# Patient Record
Sex: Female | Born: 1945 | Race: Black or African American | Hispanic: No | Marital: Single | Smoking: Never smoker
Health system: Southern US, Community
[De-identification: ages and names within clinical notes are randomized; demographics above are authoritative.]

## PROBLEM LIST (undated history)

## (undated) DIAGNOSIS — I5189 Other ill-defined heart diseases: Secondary | ICD-10-CM

## (undated) DIAGNOSIS — I10 Essential (primary) hypertension: Secondary | ICD-10-CM

## (undated) DIAGNOSIS — I5022 Chronic systolic (congestive) heart failure: Secondary | ICD-10-CM

## (undated) DIAGNOSIS — I428 Other cardiomyopathies: Secondary | ICD-10-CM

## (undated) DIAGNOSIS — J309 Allergic rhinitis, unspecified: Secondary | ICD-10-CM

## (undated) DIAGNOSIS — Z87898 Personal history of other specified conditions: Secondary | ICD-10-CM

## (undated) DIAGNOSIS — R9430 Abnormal result of cardiovascular function study, unspecified: Secondary | ICD-10-CM

## (undated) DIAGNOSIS — I429 Cardiomyopathy, unspecified: Secondary | ICD-10-CM

## (undated) DIAGNOSIS — Z972 Presence of dental prosthetic device (complete) (partial): Secondary | ICD-10-CM

## (undated) DIAGNOSIS — E119 Type 2 diabetes mellitus without complications: Secondary | ICD-10-CM

## (undated) DIAGNOSIS — I509 Heart failure, unspecified: Secondary | ICD-10-CM

## (undated) DIAGNOSIS — I340 Nonrheumatic mitral (valve) insufficiency: Secondary | ICD-10-CM

## (undated) DIAGNOSIS — I351 Nonrheumatic aortic (valve) insufficiency: Secondary | ICD-10-CM

## (undated) DIAGNOSIS — E7800 Pure hypercholesterolemia, unspecified: Secondary | ICD-10-CM

## (undated) DIAGNOSIS — R931 Abnormal findings on diagnostic imaging of heart and coronary circulation: Secondary | ICD-10-CM

## (undated) DIAGNOSIS — E1151 Type 2 diabetes mellitus with diabetic peripheral angiopathy without gangrene: Secondary | ICD-10-CM

## (undated) DIAGNOSIS — I447 Left bundle-branch block, unspecified: Secondary | ICD-10-CM

## (undated) DIAGNOSIS — Z974 Presence of external hearing-aid: Secondary | ICD-10-CM

## (undated) DIAGNOSIS — J45909 Unspecified asthma, uncomplicated: Secondary | ICD-10-CM

## (undated) DIAGNOSIS — I420 Dilated cardiomyopathy: Secondary | ICD-10-CM

## (undated) DIAGNOSIS — R42 Dizziness and giddiness: Secondary | ICD-10-CM

## (undated) HISTORY — PX: LUMBAR LAMINECTOMY: SHX95

## (undated) HISTORY — PX: TONSILLECTOMY: SUR1361

## (undated) HISTORY — PX: APPENDECTOMY: SHX54

## (undated) HISTORY — DX: Heart failure, unspecified: I50.9

## (undated) HISTORY — PX: CHOLECYSTECTOMY: SHX55

---

## 2004-01-27 ENCOUNTER — Ambulatory Visit: Payer: Self-pay | Admitting: Internal Medicine

## 2004-08-11 ENCOUNTER — Other Ambulatory Visit: Payer: Self-pay

## 2004-08-11 ENCOUNTER — Emergency Department: Payer: Self-pay | Admitting: Emergency Medicine

## 2005-05-31 ENCOUNTER — Ambulatory Visit: Payer: Self-pay | Admitting: Internal Medicine

## 2005-06-23 ENCOUNTER — Ambulatory Visit: Payer: Self-pay | Admitting: Surgery

## 2005-09-25 ENCOUNTER — Emergency Department: Payer: Self-pay | Admitting: Unknown Physician Specialty

## 2006-10-24 ENCOUNTER — Ambulatory Visit: Payer: Self-pay | Admitting: Internal Medicine

## 2007-11-30 ENCOUNTER — Ambulatory Visit: Payer: Self-pay | Admitting: Internal Medicine

## 2008-12-24 ENCOUNTER — Ambulatory Visit: Payer: Self-pay | Admitting: Internal Medicine

## 2009-01-31 ENCOUNTER — Observation Stay: Payer: Self-pay | Admitting: Internal Medicine

## 2009-02-03 ENCOUNTER — Ambulatory Visit: Payer: Self-pay | Admitting: Internal Medicine

## 2009-03-04 ENCOUNTER — Other Ambulatory Visit: Payer: Self-pay | Admitting: Unknown Physician Specialty

## 2009-03-11 ENCOUNTER — Ambulatory Visit: Payer: Self-pay | Admitting: Unknown Physician Specialty

## 2010-01-21 ENCOUNTER — Ambulatory Visit: Payer: Self-pay | Admitting: Internal Medicine

## 2010-04-28 IMAGING — CT CT ABD-PELV W/ CM
1 of 2 series · 15 of 32 positions shown, 19 images · non-contrast
Comparison: none

REASON FOR EXAM: (1) LLQ pain; (2) LLQ pain
COMMENTS:   May transport without cardiac monitor

[Series 2: 3mm soft tissue · axial · 0.64mm/px · z∈[-407,+10]mm · 15 of 153 slices shown, 19 images]
[im 7/153  soft-tissue]
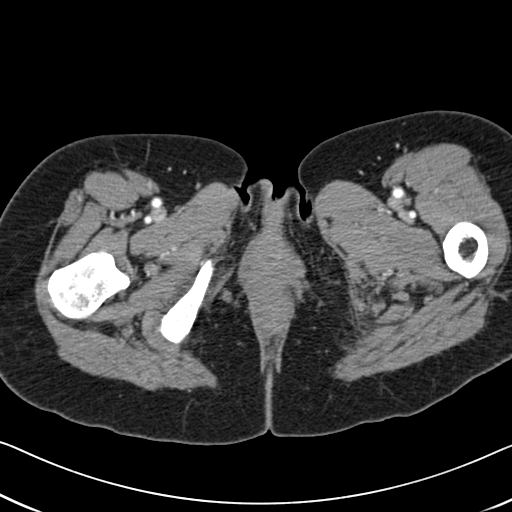
[im 7/153  bone]
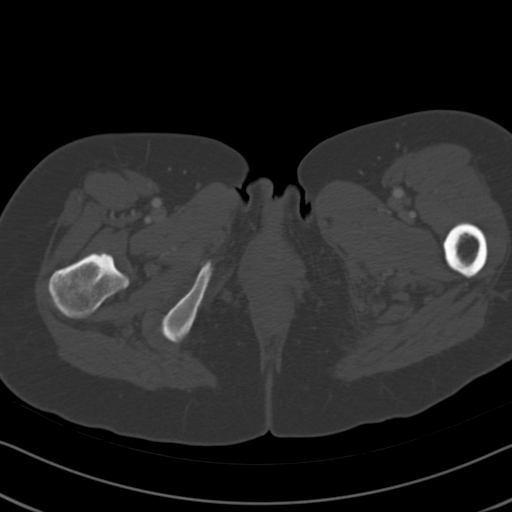
[im 20/153  soft-tissue]
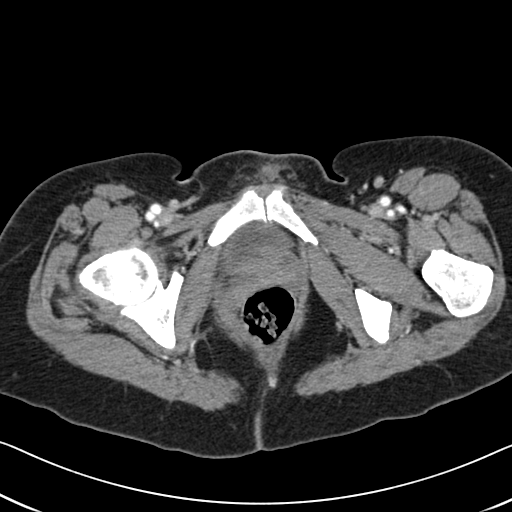
[im 32/153  soft-tissue]
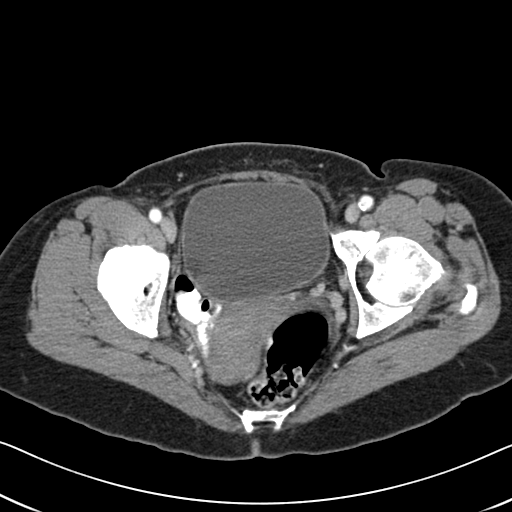
[im 45/153  soft-tissue]
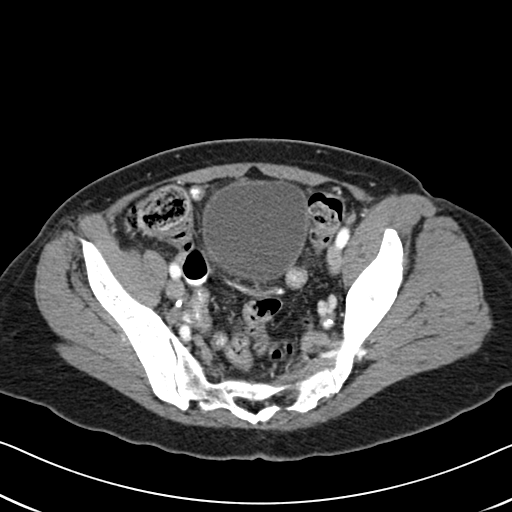
[im 51/153  soft-tissue]
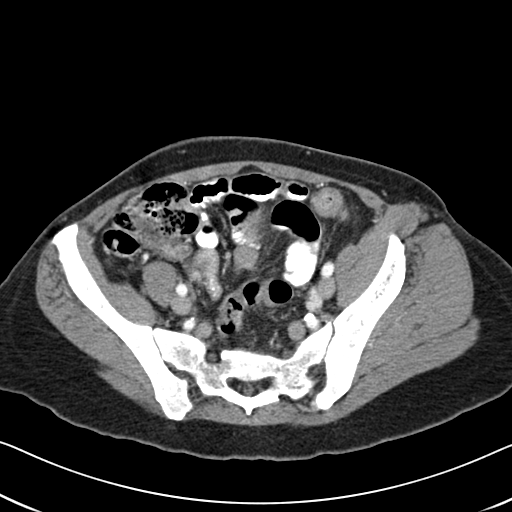
[im 64/153  soft-tissue]
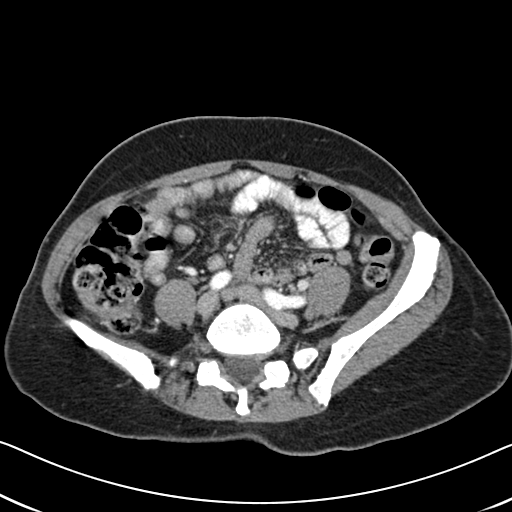
[im 77/153  soft-tissue]
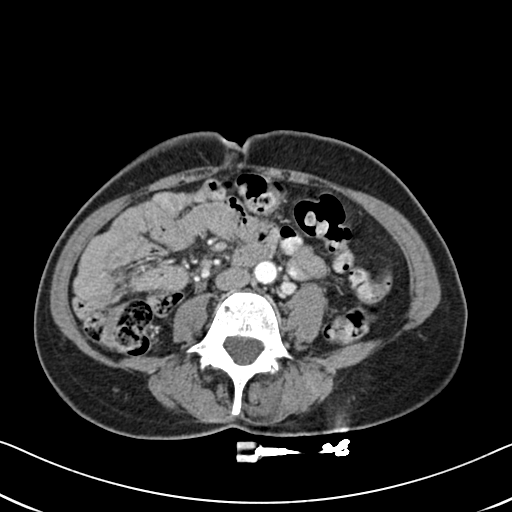
[im 89/153  soft-tissue]
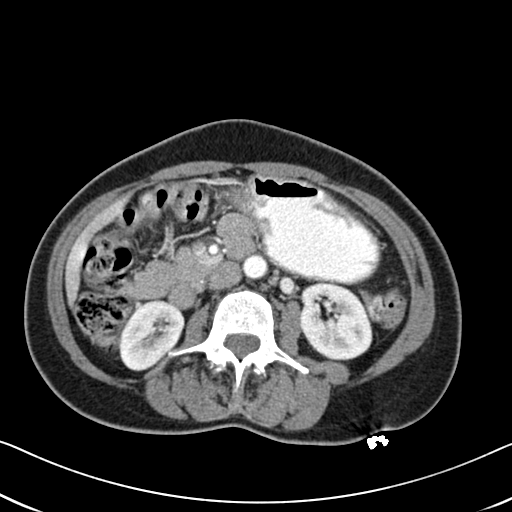
[im 102/153  soft-tissue]
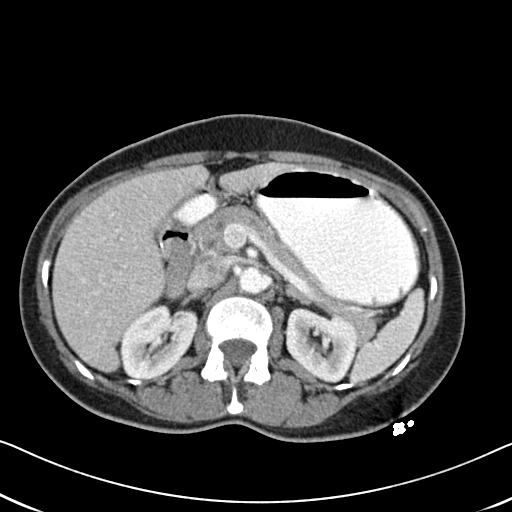
[im 102/153  bone]
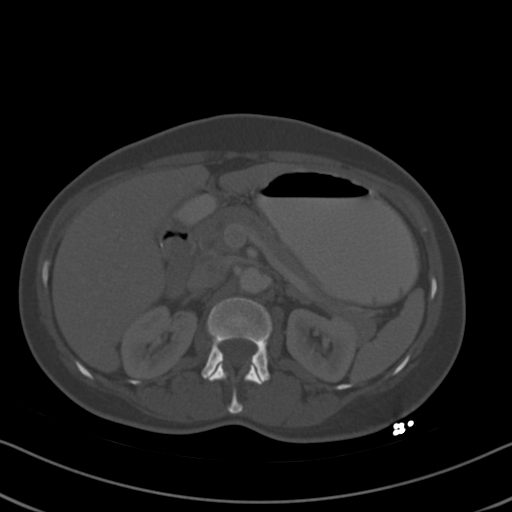
[im 108/153  soft-tissue]
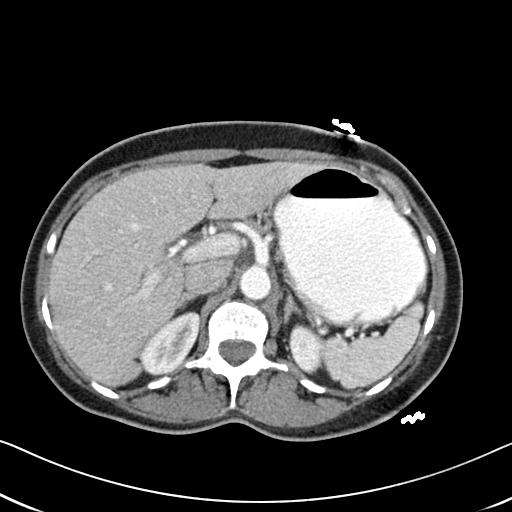
[im 121/153  soft-tissue]
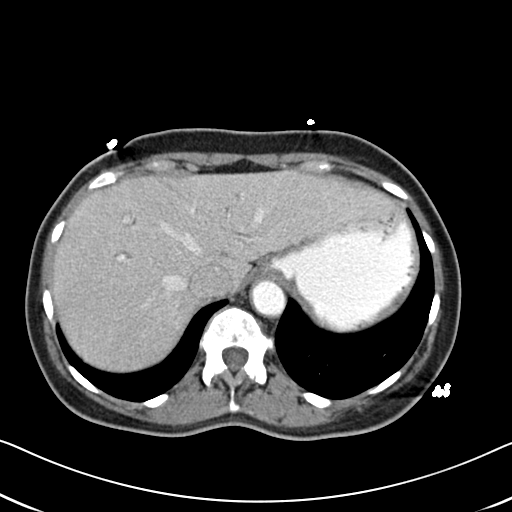
[im 127/153  lung]
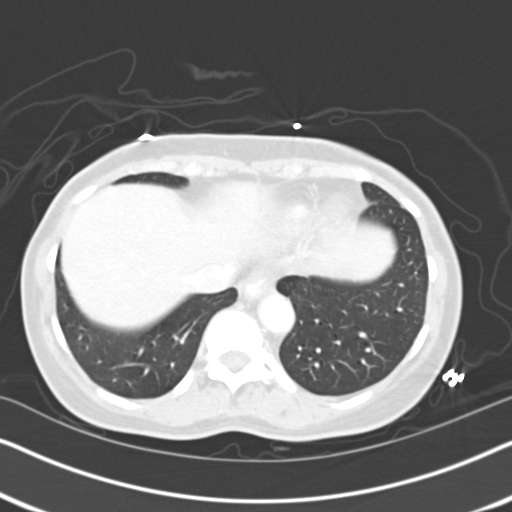
[im 134/153  soft-tissue]
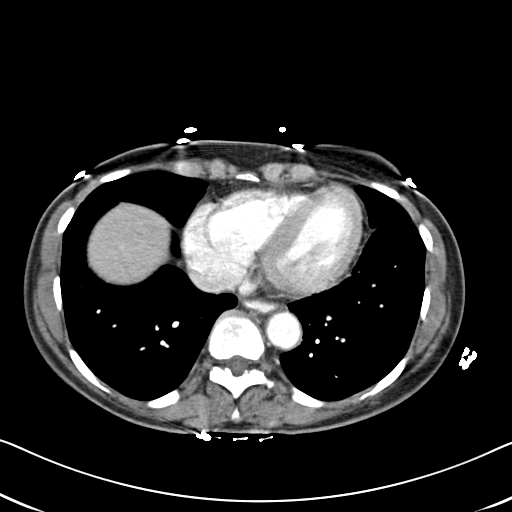
[im 134/153  lung]
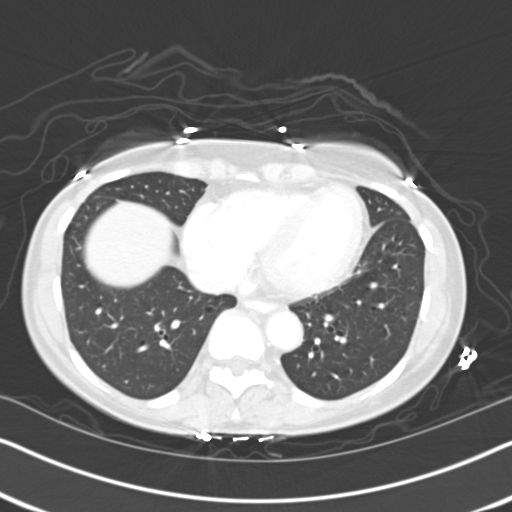
[im 140/153  lung]
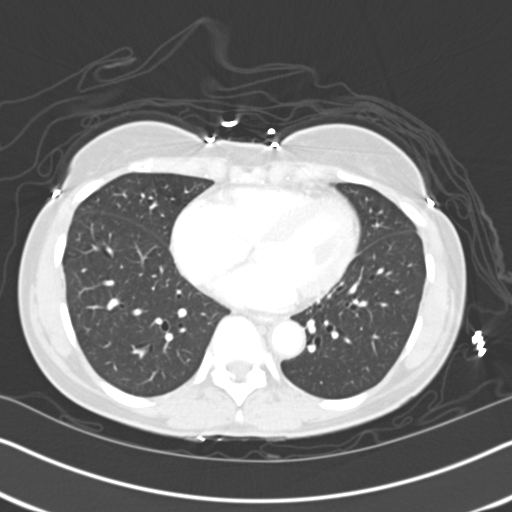
[im 146/153  soft-tissue]
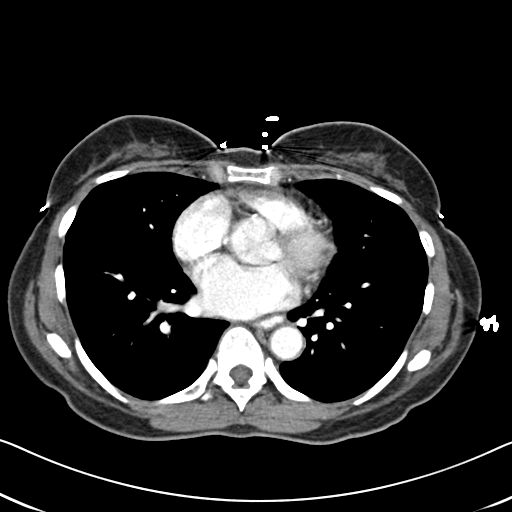
[im 146/153  lung]
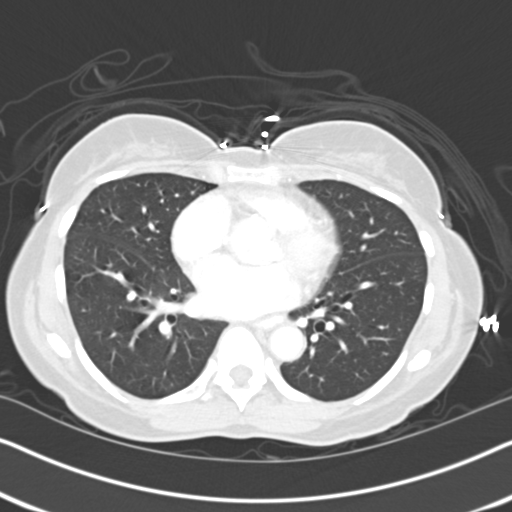

[15 of 32 positions shown; findings below may reference images not displayed]

PROCEDURE:     CT  - CT ABDOMEN / PELVIS  W  - January 31, 2009  [DATE]

RESULT:     CT of the abdomen and pelvis is performed in the standard
fashion. Oral and intravenous contrast was used with the patient receiving
85 mL of 3sovue-AVJ iodinated intravenous contrast. Images are reconstructed
at 3 mm slice thickness in the axial plane.

Cholecystectomy clips are present. There is mild prominence of the
intrahepatic biliary ductal system. The common bile duct just before
entering the pancreatic head measures up to 12 mm in diameter. Correlate
with clinical and laboratory data. The adrenal glands are normal. The spleen
is normal in size. The pancreas shows no discrete mass. The kidneys
demonstrate normal enhancement without obstruction. The bowel is
predominantly unopacified. There is a moderately large amount of urine in
the urinary bladder. No significant ascites or abnormal fluid collection is
seen. The appendix is not identified. There is no evidence of adenopathy.
There is no abnormal bowel distention, free air or bowel wall thickening.
IMPRESSION: 1. Status post cholecystectomy. Mild intrahepatic and extrahepatic biliary
ductal prominence which can be seen following cholecystectomy. No other
significant findings is demonstrated. The patient refused to drink the oral
contrast and therefore there is incomplete and poor opacification of the
bowel. If there are continued symptoms of an acute infectious process repeat
study could be considered.

## 2011-01-24 ENCOUNTER — Ambulatory Visit: Payer: Self-pay | Admitting: Internal Medicine

## 2012-01-25 ENCOUNTER — Ambulatory Visit: Payer: Self-pay | Admitting: Internal Medicine

## 2013-01-25 ENCOUNTER — Ambulatory Visit: Payer: Self-pay | Admitting: Internal Medicine

## 2014-01-27 ENCOUNTER — Ambulatory Visit: Payer: Self-pay | Admitting: Internal Medicine

## 2014-12-16 ENCOUNTER — Other Ambulatory Visit: Payer: Self-pay | Admitting: Internal Medicine

## 2014-12-16 DIAGNOSIS — Z1231 Encounter for screening mammogram for malignant neoplasm of breast: Secondary | ICD-10-CM

## 2015-01-29 ENCOUNTER — Ambulatory Visit
Admission: RE | Admit: 2015-01-29 | Discharge: 2015-01-29 | Disposition: A | Discharge status: Home / Self Care | Payer: Medicare Other | Source: Ambulatory Visit | Attending: Internal Medicine | Admitting: Internal Medicine

## 2015-01-29 ENCOUNTER — Encounter (INDEPENDENT_AMBULATORY_CARE_PROVIDER_SITE_OTHER): Payer: Self-pay

## 2015-01-29 ENCOUNTER — Other Ambulatory Visit: Payer: Self-pay | Admitting: Internal Medicine

## 2015-01-29 DIAGNOSIS — Z1231 Encounter for screening mammogram for malignant neoplasm of breast: Secondary | ICD-10-CM | POA: Diagnosis present

## 2015-06-30 ENCOUNTER — Encounter: Payer: Self-pay | Admitting: Emergency Medicine

## 2015-06-30 ENCOUNTER — Emergency Department
Admission: EM | Admit: 2015-06-30 | Discharge: 2015-06-30 | Disposition: A | Discharge status: Home / Self Care | Payer: Medicare Other | Attending: Emergency Medicine | Admitting: Emergency Medicine

## 2015-06-30 DIAGNOSIS — I10 Essential (primary) hypertension: Secondary | ICD-10-CM | POA: Insufficient documentation

## 2015-06-30 DIAGNOSIS — W57XXXA Bitten or stung by nonvenomous insect and other nonvenomous arthropods, initial encounter: Secondary | ICD-10-CM | POA: Insufficient documentation

## 2015-06-30 DIAGNOSIS — S80861A Insect bite (nonvenomous), right lower leg, initial encounter: Secondary | ICD-10-CM | POA: Diagnosis not present

## 2015-06-30 DIAGNOSIS — Y939 Activity, unspecified: Secondary | ICD-10-CM | POA: Insufficient documentation

## 2015-06-30 DIAGNOSIS — Y999 Unspecified external cause status: Secondary | ICD-10-CM | POA: Diagnosis not present

## 2015-06-30 DIAGNOSIS — Y929 Unspecified place or not applicable: Secondary | ICD-10-CM | POA: Insufficient documentation

## 2015-06-30 HISTORY — DX: Pure hypercholesterolemia, unspecified: E78.00

## 2015-06-30 HISTORY — DX: Essential (primary) hypertension: I10

## 2015-06-30 MED ORDER — HYDROCORTISONE VALERATE 0.2 % EX OINT: TOPICAL_OINTMENT | CUTANEOUS | Status: AC

## 2015-06-30 MED ORDER — CYPROHEPTADINE HCL 4 MG PO TABS: 4.0000 mg | ORAL_TABLET | Freq: Three times a day (TID) | ORAL | Status: DC | PRN

## 2015-06-30 NOTE — ED Notes (Signed)
Discharge instructions reviewed with patient. Questions fielded by this RN. Patient verbalizes understanding of instructions. Patient discharged home in stable condition per [facility] . No acute distress noted at time of discharge.
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
## 2015-06-30 NOTE — Discharge Instructions (Signed)
Insect Bite °Mosquitoes, flies, fleas, bedbugs, and other insects can bite. Insect bites are different from insect stings. The bite may be red, puffy (swollen), and itchy for 2 to 4 days. Most bites get better on their own. °HOME CARE  °· Do not scratch the bite. °· Keep the bite clean and dry. Wash the bite with soap and water every day, as told by your doctor. °· If directed, apply ice to the bite area. °¨ Put ice in a plastic bag. °¨ Place a towel between your skin and the bag. °¨ Leave the ice on for 20 minutes, 2-3 times per day. °· Follow instructions from your doctor about using medicated lotions or creams. These can help with itching. °· Apply or take over-the-counter and prescription medicines only as told by your doctor. °· If you were given an antibiotic medicine, use it as told by your doctor. Do not stop using the medicine even if your condition improves. °· Keep all follow-up visits as told by your doctor. This is important. °GET HELP IF: °· You have redness, swelling (inflammation), or pain near your bite that is getting worse. °· You have a fever. °GET HELP RIGHT AWAY IF:  °· You have joint pain.   °· You have fluid, blood, or pus coming from the bite area.   °· You have a headache. °· You have neck pain. °· You feel weaker than you normally do.   °· You have a rash.   °· You have chest pain. °· You have shortness of breath. °· You have stomach pain, feel sick to your stomach (nauseous), or throw up (vomit). °· You feel more tired or sleepy than you normally do. °  °This information is not intended to replace advice given to you by your health care provider. Make sure you discuss any questions you have with your health care provider. °  °Document Released: 12/25/1999 Document Revised: 09/17/2014 Document Reviewed: 05/14/2014 °Elsevier Interactive Patient Education ©2016 [employer] ° °

## 2015-06-30 NOTE — ED Notes (Signed)
Insect bit to right upper leg today at around 1430.  Benadryl and hydrocortisone cream to areas.

## 2015-12-17 DIAGNOSIS — I10 Essential (primary) hypertension: Secondary | ICD-10-CM | POA: Insufficient documentation

## 2015-12-21 ENCOUNTER — Other Ambulatory Visit: Payer: Self-pay | Admitting: Internal Medicine

## 2015-12-21 DIAGNOSIS — Z1231 Encounter for screening mammogram for malignant neoplasm of breast: Secondary | ICD-10-CM

## 2016-02-01 ENCOUNTER — Ambulatory Visit
Admission: RE | Admit: 2016-02-01 | Discharge: 2016-02-01 | Disposition: A | Discharge status: Home / Self Care | Payer: Medicare Other | Source: Ambulatory Visit | Attending: Internal Medicine | Admitting: Internal Medicine

## 2016-02-01 DIAGNOSIS — Z1231 Encounter for screening mammogram for malignant neoplasm of breast: Secondary | ICD-10-CM | POA: Insufficient documentation

## 2016-12-27 ENCOUNTER — Other Ambulatory Visit: Payer: Self-pay | Admitting: Internal Medicine

## 2016-12-27 DIAGNOSIS — Z1231 Encounter for screening mammogram for malignant neoplasm of breast: Secondary | ICD-10-CM

## 2017-02-01 ENCOUNTER — Ambulatory Visit
Admission: RE | Admit: 2017-02-01 | Discharge: 2017-02-01 | Disposition: A | Discharge status: Home / Self Care | Payer: Medicare Other | Source: Ambulatory Visit | Attending: Internal Medicine | Admitting: Internal Medicine

## 2017-02-01 DIAGNOSIS — Z1231 Encounter for screening mammogram for malignant neoplasm of breast: Secondary | ICD-10-CM | POA: Diagnosis not present

## 2017-09-29 ENCOUNTER — Other Ambulatory Visit: Payer: Self-pay | Admitting: Physician Assistant

## 2017-09-29 ENCOUNTER — Ambulatory Visit
Admission: RE | Admit: 2017-09-29 | Discharge: 2017-09-29 | Disposition: A | Discharge status: Home / Self Care | Payer: Medicare Other | Source: Ambulatory Visit | Attending: Physician Assistant | Admitting: Physician Assistant

## 2017-09-29 DIAGNOSIS — M79661 Pain in right lower leg: Secondary | ICD-10-CM | POA: Insufficient documentation

## 2017-11-02 ENCOUNTER — Other Ambulatory Visit: Payer: Self-pay | Admitting: Internal Medicine

## 2017-11-02 DIAGNOSIS — Z1231 Encounter for screening mammogram for malignant neoplasm of breast: Secondary | ICD-10-CM

## 2018-02-02 ENCOUNTER — Ambulatory Visit
Admission: RE | Admit: 2018-02-02 | Discharge: 2018-02-02 | Disposition: A | Discharge status: Home / Self Care | Payer: Medicare Other | Source: Ambulatory Visit | Attending: Internal Medicine | Admitting: Internal Medicine

## 2018-02-02 DIAGNOSIS — Z1231 Encounter for screening mammogram for malignant neoplasm of breast: Secondary | ICD-10-CM | POA: Diagnosis not present

## 2018-12-28 ENCOUNTER — Other Ambulatory Visit: Payer: Self-pay | Admitting: Internal Medicine

## 2018-12-28 DIAGNOSIS — Z1231 Encounter for screening mammogram for malignant neoplasm of breast: Secondary | ICD-10-CM

## 2019-01-18 ENCOUNTER — Ambulatory Visit: Payer: Medicare Other | Attending: Internal Medicine

## 2019-02-06 ENCOUNTER — Ambulatory Visit
Admission: RE | Admit: 2019-02-06 | Discharge: 2019-02-06 | Disposition: A | Discharge status: Home / Self Care | Payer: Medicare Other | Source: Ambulatory Visit | Attending: Internal Medicine | Admitting: Internal Medicine

## 2019-02-06 DIAGNOSIS — Z1231 Encounter for screening mammogram for malignant neoplasm of breast: Secondary | ICD-10-CM | POA: Diagnosis present

## 2020-01-01 ENCOUNTER — Other Ambulatory Visit: Payer: Self-pay | Admitting: Internal Medicine

## 2020-01-01 DIAGNOSIS — Z1231 Encounter for screening mammogram for malignant neoplasm of breast: Secondary | ICD-10-CM

## 2020-02-07 ENCOUNTER — Ambulatory Visit
Admission: RE | Admit: 2020-02-07 | Discharge: 2020-02-07 | Disposition: A | Discharge status: Home / Self Care | Payer: Medicare Other | Source: Ambulatory Visit | Attending: Internal Medicine | Admitting: Internal Medicine

## 2020-02-07 ENCOUNTER — Other Ambulatory Visit: Payer: Self-pay

## 2020-02-07 DIAGNOSIS — Z1231 Encounter for screening mammogram for malignant neoplasm of breast: Secondary | ICD-10-CM | POA: Insufficient documentation

## 2020-06-11 ENCOUNTER — Emergency Department: Payer: Medicare Other

## 2020-06-11 ENCOUNTER — Emergency Department
Admission: EM | Admit: 2020-06-11 | Discharge: 2020-06-11 | Disposition: A | Discharge status: Home / Self Care | Payer: Medicare Other | Attending: Emergency Medicine | Admitting: Emergency Medicine

## 2020-06-11 ENCOUNTER — Other Ambulatory Visit: Payer: Self-pay

## 2020-06-11 DIAGNOSIS — Y92481 Parking lot as the place of occurrence of the external cause: Secondary | ICD-10-CM | POA: Insufficient documentation

## 2020-06-11 DIAGNOSIS — S40022A Contusion of left upper arm, initial encounter: Secondary | ICD-10-CM | POA: Insufficient documentation

## 2020-06-11 DIAGNOSIS — W19XXXA Unspecified fall, initial encounter: Secondary | ICD-10-CM

## 2020-06-11 DIAGNOSIS — S4992XA Unspecified injury of left shoulder and upper arm, initial encounter: Secondary | ICD-10-CM | POA: Diagnosis present

## 2020-06-11 DIAGNOSIS — I10 Essential (primary) hypertension: Secondary | ICD-10-CM | POA: Diagnosis not present

## 2020-06-11 DIAGNOSIS — S7001XA Contusion of right hip, initial encounter: Secondary | ICD-10-CM | POA: Diagnosis not present

## 2020-06-11 MED ORDER — HYDROCODONE-ACETAMINOPHEN 5-325 MG PO TABS
1.0000 | ORAL_TABLET | Freq: Once | ORAL | Status: AC
Start: 2020-06-11 — End: 2020-06-11
  Administered 2020-06-11: 1 via ORAL
  Filled 2020-06-11: qty 1

## 2020-06-11 MED ORDER — METHOCARBAMOL 500 MG PO TABS: 500.0000 mg | ORAL_TABLET | Freq: Four times a day (QID) | ORAL | 0 refills | Status: DC

## 2020-06-11 MED ORDER — PREDNISONE 20 MG PO TABS
60.0000 mg | ORAL_TABLET | Freq: Once | ORAL | Status: AC
  Administered 2020-06-11: 60 mg via ORAL
  Filled 2020-06-11: qty 3

## 2020-06-11 MED ORDER — PREDNISONE 10 MG PO TABS: 10.0000 mg | ORAL_TABLET | Freq: Every day | ORAL | 0 refills | Status: DC
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
## 2020-09-29 ENCOUNTER — Other Ambulatory Visit: Payer: Self-pay | Admitting: Internal Medicine

## 2020-09-29 DIAGNOSIS — M5116 Intervertebral disc disorders with radiculopathy, lumbar region: Secondary | ICD-10-CM

## 2020-10-09 ENCOUNTER — Other Ambulatory Visit: Payer: Self-pay

## 2020-10-09 ENCOUNTER — Ambulatory Visit
Admission: RE | Admit: 2020-10-09 | Discharge: 2020-10-09 | Disposition: A | Discharge status: Home / Self Care | Payer: Medicare Other | Source: Ambulatory Visit | Attending: Internal Medicine | Admitting: Internal Medicine

## 2020-10-09 DIAGNOSIS — M5116 Intervertebral disc disorders with radiculopathy, lumbar region: Secondary | ICD-10-CM | POA: Insufficient documentation

## 2020-12-24 ENCOUNTER — Other Ambulatory Visit: Payer: Self-pay | Admitting: Internal Medicine

## 2020-12-24 DIAGNOSIS — Z1231 Encounter for screening mammogram for malignant neoplasm of breast: Secondary | ICD-10-CM

## 2021-02-08 ENCOUNTER — Ambulatory Visit
Admission: RE | Admit: 2021-02-08 | Discharge: 2021-02-08 | Disposition: A | Discharge status: Home / Self Care | Payer: Medicare Other | Source: Ambulatory Visit | Attending: Internal Medicine | Admitting: Internal Medicine

## 2021-02-08 ENCOUNTER — Other Ambulatory Visit: Payer: Self-pay

## 2021-02-08 DIAGNOSIS — Z1231 Encounter for screening mammogram for malignant neoplasm of breast: Secondary | ICD-10-CM | POA: Diagnosis present

## 2021-08-25 ENCOUNTER — Inpatient Hospital Stay
Admission: EM | Admit: 2021-08-25 | Discharge: 2021-08-31 | DRG: 286 | Disposition: A | Discharge status: Home / Self Care | Payer: Medicare Other | Attending: Internal Medicine | Admitting: Internal Medicine

## 2021-08-25 ENCOUNTER — Emergency Department: Payer: Medicare Other

## 2021-08-25 ENCOUNTER — Encounter: Payer: Self-pay | Admitting: Radiology

## 2021-08-25 ENCOUNTER — Other Ambulatory Visit: Payer: Self-pay

## 2021-08-25 DIAGNOSIS — N644 Mastodynia: Secondary | ICD-10-CM | POA: Diagnosis present

## 2021-08-25 DIAGNOSIS — E876 Hypokalemia: Secondary | ICD-10-CM | POA: Diagnosis present

## 2021-08-25 DIAGNOSIS — J209 Acute bronchitis, unspecified: Secondary | ICD-10-CM

## 2021-08-25 DIAGNOSIS — J45909 Unspecified asthma, uncomplicated: Secondary | ICD-10-CM | POA: Diagnosis present

## 2021-08-25 DIAGNOSIS — I110 Hypertensive heart disease with heart failure: Principal | ICD-10-CM | POA: Diagnosis present

## 2021-08-25 DIAGNOSIS — I5021 Acute systolic (congestive) heart failure: Secondary | ICD-10-CM

## 2021-08-25 DIAGNOSIS — I080 Rheumatic disorders of both mitral and aortic valves: Secondary | ICD-10-CM | POA: Diagnosis present

## 2021-08-25 DIAGNOSIS — I10 Essential (primary) hypertension: Secondary | ICD-10-CM

## 2021-08-25 DIAGNOSIS — I447 Left bundle-branch block, unspecified: Secondary | ICD-10-CM | POA: Diagnosis present

## 2021-08-25 DIAGNOSIS — Z9889 Other specified postprocedural states: Secondary | ICD-10-CM

## 2021-08-25 DIAGNOSIS — R778 Other specified abnormalities of plasma proteins: Secondary | ICD-10-CM

## 2021-08-25 DIAGNOSIS — Z79899 Other long term (current) drug therapy: Secondary | ICD-10-CM

## 2021-08-25 DIAGNOSIS — E871 Hypo-osmolality and hyponatremia: Secondary | ICD-10-CM | POA: Diagnosis present

## 2021-08-25 DIAGNOSIS — R0789 Other chest pain: Secondary | ICD-10-CM | POA: Diagnosis not present

## 2021-08-25 DIAGNOSIS — E7800 Pure hypercholesterolemia, unspecified: Secondary | ICD-10-CM | POA: Diagnosis present

## 2021-08-25 DIAGNOSIS — J9601 Acute respiratory failure with hypoxia: Secondary | ICD-10-CM | POA: Diagnosis present

## 2021-08-25 DIAGNOSIS — I2510 Atherosclerotic heart disease of native coronary artery without angina pectoris: Secondary | ICD-10-CM | POA: Diagnosis present

## 2021-08-25 DIAGNOSIS — R000 Tachycardia, unspecified: Secondary | ICD-10-CM

## 2021-08-25 DIAGNOSIS — J9811 Atelectasis: Secondary | ICD-10-CM | POA: Diagnosis present

## 2021-08-25 DIAGNOSIS — G8929 Other chronic pain: Secondary | ICD-10-CM | POA: Diagnosis present

## 2021-08-25 DIAGNOSIS — Z8249 Family history of ischemic heart disease and other diseases of the circulatory system: Secondary | ICD-10-CM

## 2021-08-25 DIAGNOSIS — Z20822 Contact with and (suspected) exposure to covid-19: Secondary | ICD-10-CM | POA: Diagnosis present

## 2021-08-25 DIAGNOSIS — R079 Chest pain, unspecified: Secondary | ICD-10-CM | POA: Diagnosis present

## 2021-08-25 DIAGNOSIS — D259 Leiomyoma of uterus, unspecified: Secondary | ICD-10-CM | POA: Diagnosis present

## 2021-08-25 DIAGNOSIS — R739 Hyperglycemia, unspecified: Secondary | ICD-10-CM

## 2021-08-25 DIAGNOSIS — R918 Other nonspecific abnormal finding of lung field: Secondary | ICD-10-CM

## 2021-08-25 DIAGNOSIS — I420 Dilated cardiomyopathy: Secondary | ICD-10-CM | POA: Diagnosis present

## 2021-08-25 DIAGNOSIS — F419 Anxiety disorder, unspecified: Secondary | ICD-10-CM | POA: Diagnosis present

## 2021-08-25 DIAGNOSIS — J40 Bronchitis, not specified as acute or chronic: Principal | ICD-10-CM

## 2021-08-25 DIAGNOSIS — I509 Heart failure, unspecified: Secondary | ICD-10-CM

## 2021-08-25 DIAGNOSIS — R7303 Prediabetes: Secondary | ICD-10-CM | POA: Diagnosis present

## 2021-08-25 DIAGNOSIS — I5041 Acute combined systolic (congestive) and diastolic (congestive) heart failure: Secondary | ICD-10-CM | POA: Diagnosis present

## 2021-08-25 DIAGNOSIS — R0902 Hypoxemia: Secondary | ICD-10-CM

## 2021-08-25 HISTORY — DX: Allergic rhinitis, unspecified: J30.9

## 2021-08-25 LAB — CBC WITH DIFFERENTIAL/PLATELET
Abs Immature Granulocytes: 0.02 10*3/uL (ref 0.00–0.07)
Basophils Absolute: 0 10*3/uL (ref 0.0–0.1)
Basophils Relative: 0 %
Eosinophils Absolute: 0 10*3/uL (ref 0.0–0.5)
Eosinophils Relative: 0 %
HCT: 34.9 % — ABNORMAL LOW (ref 36.0–46.0)
Hemoglobin: 11.5 g/dL — ABNORMAL LOW (ref 12.0–15.0)
Immature Granulocytes: 0 %
Lymphocytes Relative: 11 %
Lymphs Abs: 0.8 10*3/uL (ref 0.7–4.0)
MCH: 30.6 pg (ref 26.0–34.0)
MCHC: 33 g/dL (ref 30.0–36.0)
MCV: 92.8 fL (ref 80.0–100.0)
Monocytes Absolute: 0.1 10*3/uL (ref 0.1–1.0)
Monocytes Relative: 1 %
Neutro Abs: 5.7 10*3/uL (ref 1.7–7.7)
Neutrophils Relative %: 88 %
Platelets: 232 10*3/uL (ref 150–400)
RBC: 3.76 MIL/uL — ABNORMAL LOW (ref 3.87–5.11)
RDW: 13.3 % (ref 11.5–15.5)
Smear Review: NORMAL
WBC: 6.6 10*3/uL (ref 4.0–10.5)
nRBC: 0 % (ref 0.0–0.2)

## 2021-08-25 LAB — COMPREHENSIVE METABOLIC PANEL
ALT: 35 U/L (ref 0–44)
AST: 45 U/L — ABNORMAL HIGH (ref 15–41)
Albumin: 4.1 g/dL (ref 3.5–5.0)
Alkaline Phosphatase: 81 U/L (ref 38–126)
Anion gap: 12 (ref 5–15)
BUN: 14 mg/dL (ref 8–23)
CO2: 21 mmol/L — ABNORMAL LOW (ref 22–32)
Calcium: 9.3 mg/dL (ref 8.9–10.3)
Chloride: 98 mmol/L (ref 98–111)
Creatinine, Ser: 0.84 mg/dL (ref 0.44–1.00)
GFR, Estimated: >60 mL/min (ref >60)
Glucose, Bld: 246 mg/dL — ABNORMAL HIGH (ref 70–99)
Potassium: 3.5 mmol/L (ref 3.5–5.1)
Sodium: 131 mmol/L — ABNORMAL LOW (ref 135–145)
Total Bilirubin: 0.7 mg/dL (ref 0.3–1.2)
Total Protein: 7.5 g/dL (ref 6.5–8.1)

## 2021-08-25 LAB — RESP PANEL BY RT-PCR (FLU A&B, COVID) ARPGX2
Influenza A by PCR: NEGATIVE
Influenza B by PCR: NEGATIVE
SARS Coronavirus 2 by RT PCR: NEGATIVE

## 2021-08-25 LAB — TROPONIN I (HIGH SENSITIVITY)
Troponin I (High Sensitivity): 15 ng/L (ref <18)
Troponin I (High Sensitivity): 22 ng/L — ABNORMAL HIGH (ref <18)

## 2021-08-25 LAB — TSH: TSH: 1.052 u[IU]/mL (ref 0.350–4.500)

## 2021-08-25 MED ORDER — ASPIRIN 81 MG PO TBEC
81.0000 mg | DELAYED_RELEASE_TABLET | Freq: Every day | ORAL | Status: DC
  Administered 2021-08-26 – 2021-08-31 (×5): 81 mg via ORAL
  Filled 2021-08-25 (×5): qty 1

## 2021-08-25 MED ORDER — ASPIRIN 300 MG RE SUPP: 300.0000 mg | RECTAL | Status: AC

## 2021-08-25 MED ORDER — ALBUTEROL SULFATE (2.5 MG/3ML) 0.083% IN NEBU
2.5000 mg | INHALATION_SOLUTION | Freq: Once | RESPIRATORY_TRACT | Status: AC
  Administered 2021-08-25: 2.5 mg via RESPIRATORY_TRACT
  Filled 2021-08-25: qty 3

## 2021-08-25 MED ORDER — MORPHINE SULFATE (PF) 2 MG/ML IV SOLN
2.0000 mg | INTRAVENOUS | Status: DC | PRN
  Administered 2021-08-26 – 2021-08-28 (×4): 2 mg via INTRAVENOUS
  Filled 2021-08-25: qty 1

## 2021-08-25 MED ORDER — METHYLPREDNISOLONE SODIUM SUCC 125 MG IJ SOLR
125.0000 mg | Freq: Once | INTRAMUSCULAR | Status: AC
  Administered 2021-08-25: 125 mg via INTRAMUSCULAR
  Filled 2021-08-25: qty 2

## 2021-08-25 MED ORDER — ACETAMINOPHEN 325 MG PO TABS
650.0000 mg | ORAL_TABLET | ORAL | Status: DC | PRN
  Administered 2021-08-26 – 2021-08-30 (×3): 650 mg via ORAL
  Filled 2021-08-25 (×3): qty 2

## 2021-08-25 MED ORDER — MORPHINE SULFATE (PF) 2 MG/ML IV SOLN
2.0000 mg | INTRAVENOUS | Status: DC | PRN
  Administered 2021-08-25 – 2021-08-26 (×2): 2 mg via INTRAVENOUS
  Filled 2021-08-25 (×6): qty 1

## 2021-08-25 MED ORDER — IOHEXOL 350 MG/ML SOLN
80.0000 mL | Freq: Once | INTRAVENOUS | Status: AC | PRN
  Administered 2021-08-25: 80 mL via INTRAVENOUS

## 2021-08-25 MED ORDER — ENOXAPARIN SODIUM 40 MG/0.4ML IJ SOSY
40.0000 mg | PREFILLED_SYRINGE | INTRAMUSCULAR | Status: DC
  Administered 2021-08-25 – 2021-08-29 (×5): 40 mg via SUBCUTANEOUS
  Filled 2021-08-25 (×5): qty 0.4

## 2021-08-25 MED ORDER — MORPHINE SULFATE (PF) 4 MG/ML IV SOLN
4.0000 mg | Freq: Once | INTRAVENOUS | Status: AC
  Administered 2021-08-25: 4 mg via INTRAVENOUS
  Filled 2021-08-25: qty 1

## 2021-08-25 MED ORDER — ASPIRIN 81 MG PO CHEW
324.0000 mg | CHEWABLE_TABLET | ORAL | Status: AC
  Administered 2021-08-25: 324 mg via ORAL
  Filled 2021-08-25: qty 4

## 2021-08-25 MED ORDER — KETOROLAC TROMETHAMINE 30 MG/ML IJ SOLN
15.0000 mg | Freq: Four times a day (QID) | INTRAMUSCULAR | Status: AC | PRN
  Administered 2021-08-26 – 2021-08-27 (×2): 15 mg via INTRAVENOUS
  Filled 2021-08-25 (×2): qty 1

## 2021-08-25 MED ORDER — ONDANSETRON HCL 4 MG/2ML IJ SOLN
4.0000 mg | Freq: Four times a day (QID) | INTRAMUSCULAR | Status: DC | PRN
  Administered 2021-08-25: 4 mg via INTRAVENOUS
  Filled 2021-08-25: qty 2

## 2021-08-25 MED ORDER — NITROGLYCERIN 0.4 MG SL SUBL
0.4000 mg | SUBLINGUAL_TABLET | SUBLINGUAL | Status: DC | PRN
  Administered 2021-08-26: 0.4 mg via SUBLINGUAL
  Filled 2021-08-25 (×2): qty 1

## 2021-08-25 NOTE — ED Provider Notes (Signed)
[facility] Provider Note  Patient Contact: 4:25 PM (approximate)   History   Shortness of Breath, Back Pain, and Chest Pain   HPI  Glenda May is a 76 y.o. female who presents the emergency department complaining of shortness of breath, back pain radiating into her chest.  Patient states that for the last week she has felt like she has had increased allergy symptoms.  She does have a history of allergies and a reported possible history of asthma.  She takes Claritin every day and is continue to do so.  She started to have a cough and went to the pharmacy and got some Mucinex.  After a week she proceeded to visit her primary care which was yesterday.  She was having sciatica-like symptoms and received a shot of Kenalog for same.  Patient was put on a Z-Pak and prednisone for reported bronchitis.  Patient states that initially the medicine started to improve her symptoms both in regards to the back pain as well as her shortness of breath and cough.  She states that today she got into the shower, thought that the warm water would help when she started to have worsening back pain.  She states the pain is now radiating from her lower back into her right chest wall.  She had increased shortness of breath and called EMS.  Patient denies any history of previous heart attack.  States that her diagnosis of asthma is "not confirmed."  She does not take any medications for asthma on a regular basis.  Patient has had 2 doses of steroid but has not taken any kind of nebulizer treatments or albuterol.  Denies any GI complaints.  Denies urinary complaints.  No history of aneurysm.     Physical Exam   Triage Vital Signs: ED Triage Vitals  Enc Vitals Group     BP --      Pulse --      Resp --      Temp --      Temp src --      SpO2 08/25/21 1616 98 %     Weight 08/25/21 1617 125 lb (56.7 kg)     Height 08/25/21 1617 '5\' 2"'$  (1.575 m)     Head Circumference --      Peak  Flow --      Pain Score --      Pain Loc --      Pain Edu? --      Excl. in [place]? --     Most recent vital signs: Vitals:   08/25/21 2100 08/25/21 2221  BP: 132/71   Pulse: (!) 105   Resp: (!) 30   Temp:  97.6 F (36.4 C)  SpO2: 99%      General: Alert and in no acute distress. ENT:      Ears:       Nose: No congestion/rhinnorhea.      Mouth/Throat: Mucous membranes are moist. Neck: No stridor.  Cardiovascular:  Good peripheral perfusion.  On auscultation, tachycardia is appreciated.  No murmurs, rubs, gallops. Respiratory: Slightly increased respiratory effort with mild tachypnea but no retractions. Lungs with some mild crackles worse on the left than right.  No significant wheezing.  No rales or rhonchi.. Gastrointestinal: Bowel sounds 4 quadrants. Soft and nontender to palpation. No guarding or rigidity. No palpable masses. No distention.  No palpable masses.  No appreciable bruits. Musculoskeletal: Full range of motion to all extremities.  Patient with  reproducible tenderness to the right anterolateral rib cage.  This appears to be roughly ribs 5 through 8.  Tenderness reproduces patient's reported chest pain.  Palpation along the patient's lumbar spine reveals tenderness bilaterally in the paraspinal muscle group.  No midline tenderness or point tenderness in the lumbar spine.  No palpable abnormality. Neurologic:  No gross focal neurologic deficits are appreciated.  Skin:   No rash noted Other:   ED Results / Procedures / Treatments   Labs (all labs ordered are listed, but only abnormal results are displayed) Labs Reviewed  COMPREHENSIVE METABOLIC PANEL - Abnormal; Notable for the following components:      Result Value   Sodium 131 (*)    CO2 21 (*)    Glucose, Bld 246 (*)    AST 45 (*)    All other components within normal limits  CBC WITH DIFFERENTIAL/PLATELET - Abnormal; Notable for the following components:   RBC 3.76 (*)    Hemoglobin 11.5 (*)    HCT 34.9  (*)    All other components within normal limits  TROPONIN I (HIGH SENSITIVITY) - Abnormal; Notable for the following components:   Troponin I (High Sensitivity) 22 (*)    All other components within normal limits  RESP PANEL BY RT-PCR (FLU A&B, COVID) ARPGX2  URINALYSIS, ROUTINE W REFLEX MICROSCOPIC  LIPOPROTEIN A (LPA)  HIV ANTIBODY (ROUTINE TESTING W REFLEX)  TSH  TROPONIN I (HIGH SENSITIVITY)     EKG  ED ECG REPORT I, Charline Bills Nayson Traweek,  personally viewed and interpreted this ECG.   Date: 08/25/2021  EKG Time: 1626 hrs.  Rate: 110 bpm  Rhythm: normal sinus rhythm, LBBB, prolonged QT interval, compared to previous EKG from 08/11/2004 patient with left bundle branch block, tachycardia.  No significant discordance meeting Sgarbossa criteria.  Axis: normal  Intervals:left bundle branch block  ST&T Change: ST elevation noted in V2 through V5.  Does not meet sgarbossa criteria  Sinus tachycardia.  Left bundle branch block.  No STEMI.    RADIOLOGY  I personally viewed, evaluated, and interpreted these images as part of my medical decision making, as well as reviewing the written report by the radiologist.  ED Provider Interpretation: X-ray revealed some mild interstitial findings concerning for edema versus infection.  Further exam with CT scan revealed no evidence of dissection, large pulmonary embolism.  There are some nodules identified that are recommended for outpatient follow-up.  Nonspecific sclerotic region of the sternum with no history of fracture.  Patient has no history of malignancy either.  Slight pleural effusion identified bilaterally.  CT Angio Chest/Abd/Pel for Dissection W and/or Wo Contrast  Result Date: 08/25/2021 CLINICAL DATA:  Acute aortic syndrome (AAS) suspected EXAM: CT ANGIOGRAPHY CHEST, ABDOMEN AND PELVIS TECHNIQUE: Non-contrast CT of the chest was initially obtained. Multidetector CT imaging through the chest, abdomen and pelvis was performed using  the standard protocol during bolus administration of intravenous contrast. Multiplanar reconstructed images and MIPs were obtained and reviewed to evaluate the vascular anatomy. RADIATION DOSE REDUCTION: This exam was performed according to the departmental dose-optimization program which includes automated exposure control, adjustment of the mA and/or kV according to patient size and/or use of iterative reconstruction technique. CONTRAST:  16m OMNIPAQUE IOHEXOL 350 MG/ML SOLN COMPARISON:  None Available. FINDINGS: CTA CHEST FINDINGS Cardiovascular: Satisfactory opacification of the pulmonary arteries to the segmental level. No evidence of pulmonary embolism. The main pulmonary artery is normal in caliber. Enlarged heart size. No significant pericardial effusion. The thoracic aorta aneurysm  or dissection. Mild atherosclerotic plaque of the thoracic aorta. No coronary artery calcifications. Mediastinum/Nodes: No enlarged mediastinal, hilar, or axillary lymph nodes. Thyroid gland, trachea, and esophagus demonstrate no significant findings. Lungs/Pleura: Biapical pleural/pulmonary scarring. Passive atelectasis the bilateral lower lobes. No focal consolidation. Couple right upper lobe pulmonary nodules measuring 7 and 8 mm. Right middle lobe subpleural 8 mm ground-glass nodule (5:60, 9:27). No pulmonary mass. Lingular atelectasis versus scarring. Small right and trace left pleural effusions. No pneumothorax. Musculoskeletal: No chest wall abnormality. Couple nonspecific sclerotic regions of the sternum (9:70, 73). No acute displaced fracture. Multilevel degenerative changes of the spine. Review of the MIP images confirms the above findings. CTA ABDOMEN AND PELVIS FINDINGS VASCULAR Aorta: Mild atherosclerotic plaque. Normal caliber aorta without aneurysm, dissection, vasculitis or significant stenosis. Celiac: Patent without evidence of aneurysm, dissection, vasculitis or significant stenosis. SMA: Patent without  evidence of aneurysm, dissection, vasculitis or significant stenosis. Renals: Both renal arteries are patent without evidence of aneurysm, dissection, vasculitis, fibromuscular dysplasia or significant stenosis. IMA: Patent without evidence of aneurysm, dissection, vasculitis or significant stenosis. Inflow: Mild to moderate atherosclerotic plaque. Patent without evidence of aneurysm, dissection, vasculitis or significant stenosis. Veins: No obvious venous abnormality within the limitations of this arterial phase study. Review of the MIP images confirms the above findings. NON-VASCULAR Hepatobiliary: No focal liver abnormality. Status post cholecystectomy. No biliary dilatation. Pancreas: No focal lesion. Normal pancreatic contour. No surrounding inflammatory changes. No main pancreatic ductal dilatation. Spleen: Normal in size without focal abnormality. Adrenals/Urinary Tract: No adrenal nodule bilaterally. Bilateral kidneys enhance symmetrically. No hydronephrosis. No hydroureter. The urinary bladder is unremarkable. Stomach/Bowel: Stomach is within normal limits. No evidence of bowel wall thickening or dilatation. Colonic diverticulosis. Appendix appears normal. Lymphatic: No lymphadenopathy. Reproductive: Posterior right lobulation of the uterus with query underlying fibroid. Otherwise uterus and bilateral adnexa are unremarkable. Other: No intraperitoneal free fluid. No intraperitoneal free gas. No organized fluid collection. Musculoskeletal: No abdominal wall hernia or abnormality. No suspicious lytic or blastic osseous lesions. No acute displaced fracture. Intervertebral disc space vacuum phenomenon at the L5-S1 level. Review of the MIP images confirms the above findings. IMPRESSION: 1. No acute thoracic abnormality. 2. No pulmonary embolus. 3. Couple right upper lobe pulmonary nodules measuring 7 and 8 mm. Right middle lobe subpleural 8 mm ground-glass nodule. Initial follow-up with CT at 6 months is  recommended to confirm persistence. If persistent, repeat CT is recommended every 2 years until 5 years of stability has been established. This recommendation follows the consensus statement: Guidelines for Management of Incidental Pulmonary Nodules Detected on CT Images: From the Fleischner Society 2017; Radiology 2017; 284:228-243. 4. Small right and trace left pleural effusions. 5. Cardiomegaly. 6. Couple nonspecific sclerotic regions of the sternum. Correlate with history of prior nondisplaced fractures versus history of metastatic malignancy. Recommend attention on follow-up. 7. Colonic diverticulosis with no acute diverticulitis. 8. Likely uterine fibroid. Electronically Signed   By: Iven Finn M.D.   On: 08/25/2021 18:56   DG Chest 2 View  Result Date: 08/25/2021 CLINICAL DATA:  Cough and dyspnea EXAM: CHEST - 2 VIEW COMPARISON:  None available FINDINGS: Low lung volumes. Borderline cardiomegaly though evaluation is limited by low lung volumes. Pulmonary vascular congestion. Hazy airspace and interstitial opacities in the lower lungs. Small bilateral pleural effusions. No pneumothorax. Aortic calcification. IMPRESSION: Airspace and interstitial opacities in the lower lungs favored to represent pulmonary edema though infection is not excluded. Small bilateral pleural effusions. Aortic Atherosclerosis (ICD10-I70.0). Electronically Signed   By:  Placido Sou M.D.   On: 08/25/2021 17:35    PROCEDURES:  Critical Care performed: No  Procedures   MEDICATIONS ORDERED IN ED: Medications  aspirin EC tablet 81 mg (has no administration in time range)  nitroGLYCERIN (NITROSTAT) SL tablet 0.4 mg (has no administration in time range)  acetaminophen (TYLENOL) tablet 650 mg (has no administration in time range)  ondansetron (ZOFRAN) injection 4 mg (4 mg Intravenous Given 08/25/21 2149)  enoxaparin (LOVENOX) injection 40 mg (40 mg Subcutaneous Given 08/25/21 2149)  morphine (PF) 2 MG/ML injection 2 mg  (2 mg Intravenous Given 08/25/21 2217)  morphine (PF) 2 MG/ML injection 2 mg (has no administration in time range)  ketorolac (TORADOL) 30 MG/ML injection 15 mg (has no administration in time range)  morphine (PF) 4 MG/ML injection 4 mg (4 mg Intravenous Given 08/25/21 1657)  morphine (PF) 4 MG/ML injection 4 mg (4 mg Intravenous Given 08/25/21 1756)  iohexol (OMNIPAQUE) 350 MG/ML injection 80 mL (80 mLs Intravenous Contrast Given 08/25/21 1810)  albuterol (PROVENTIL) (2.5 MG/3ML) 0.083% nebulizer solution 2.5 mg (2.5 mg Nebulization Given 08/25/21 2035)  methylPREDNISolone sodium succinate (SOLU-MEDROL) 125 mg/2 mL injection 125 mg (125 mg Intramuscular Given 08/25/21 2035)  aspirin chewable tablet 324 mg (324 mg Oral Given 08/25/21 2149)    Or  aspirin suppository 300 mg ( Rectal See Alternative 08/25/21 2149)     IMPRESSION / MDM / ASSESSMENT AND PLAN / ED COURSE  I reviewed the triage vital signs and the nursing notes.                              Differential diagnosis includes, but is not limited to, COVID, flu, bronchitis, asthma exacerbation, pneumonia, dissection, STEMI, NSTEMI, PE  Patient's presentation is most consistent with acute presentation with potential threat to life or bodily function.   Patient's diagnosis is consistent with shortness of breath, bronchitis, atypical chest pain.  Patient presented to the ED for a week worth of increased nasal congestion, cough and shortness of breath.  Patient states that she had been taking allergy medications thinking this was her allergies but symptoms have been worsening.  She went and saw her primary care provider, was given a shot of Kenalog for sciatica, placed on steroid and antibiotic for bronchitis versus community-acquired pneumonia.  Patient thought the medicines were improving her symptoms until she was in the shower today.  She states that her back pain returned, worsened and started radiating to the right side of her chest.  Became a  slightly pleuritic pain but was reproducible on physical exam.  However the pain was severe.  Given her history patient had labs, imaging performed.  Given the nature of the back pain radiating into the chest I also ordered CT scan to evaluate for dissection.  Patient's labs are overall reassuring.  Her troponin did trend from 15 to 22 though her EKG reveals no evidence of ischemic or infarct type changes.  Suspect this is some slight strain from patient's symptoms.  There was no evidence of dissection or PE on CT scan.  Given the fact the patient is tachypneic, tachycardic, slightly hypoxic and has been on steroids and antibiotics I feel that the patient would be best suited with admission.  I reached out to the hospitalist team who agrees to admit the patient at this time.Marland Kitchen         FINAL CLINICAL IMPRESSION(S) / ED DIAGNOSES   Final diagnoses:  Bronchitis  Atypical chest pain     Rx / DC Orders   ED Discharge Orders     None        Note:  This document was prepared using Dragon voice recognition software and may include unintentional dictation errors.   Brynda Peon 08/25/21 2319    Carrie Mew, MD 08/26/21 2347

## 2021-08-25 NOTE — ED Triage Notes (Signed)
Pt was seen at her primary care provider yesterday for cough and was given a z pack and prednisone. Diagnosed with bronchitis and status asthmaticus. Pt began having worsened cough, shortness of breath as well as mid back pain as well as chest pain.

## 2021-08-25 NOTE — ED Notes (Signed)
Patient provided with ice chips and graham crackers per request. Pt resting in bed with family at bedside. Breathing unlabored with symmetric chest rise and fall. Pt reports improvement in pain and declines need for fresh ice pack at this time. Bed is low and locked with side rails raised x2. Call bell in reach and cardiac monitor in place.   Pt noted to be hypoxic at 88% on RA. 2L [place] applied and Spo2 improved to 96%.

## 2021-08-25 NOTE — ED Notes (Signed)
Patient provided with fresh ice pack to promote comfort.

## 2021-08-26 ENCOUNTER — Encounter: Payer: Self-pay | Admitting: Internal Medicine

## 2021-08-26 ENCOUNTER — Observation Stay (HOSPITAL_COMMUNITY)
Admit: 2021-08-26 | Discharge: 2021-08-26 | Disposition: A | Discharge status: Home / Self Care | Payer: Medicare Other | Attending: Internal Medicine | Admitting: Internal Medicine

## 2021-08-26 DIAGNOSIS — Z20822 Contact with and (suspected) exposure to covid-19: Secondary | ICD-10-CM | POA: Diagnosis present

## 2021-08-26 DIAGNOSIS — Z79899 Other long term (current) drug therapy: Secondary | ICD-10-CM | POA: Diagnosis not present

## 2021-08-26 DIAGNOSIS — F419 Anxiety disorder, unspecified: Secondary | ICD-10-CM | POA: Diagnosis present

## 2021-08-26 DIAGNOSIS — R7303 Prediabetes: Secondary | ICD-10-CM | POA: Diagnosis present

## 2021-08-26 DIAGNOSIS — J9600 Acute respiratory failure, unspecified whether with hypoxia or hypercapnia: Secondary | ICD-10-CM

## 2021-08-26 DIAGNOSIS — R000 Tachycardia, unspecified: Secondary | ICD-10-CM

## 2021-08-26 DIAGNOSIS — I509 Heart failure, unspecified: Secondary | ICD-10-CM

## 2021-08-26 DIAGNOSIS — I420 Dilated cardiomyopathy: Secondary | ICD-10-CM

## 2021-08-26 DIAGNOSIS — J9811 Atelectasis: Secondary | ICD-10-CM | POA: Diagnosis present

## 2021-08-26 DIAGNOSIS — I5031 Acute diastolic (congestive) heart failure: Secondary | ICD-10-CM | POA: Diagnosis not present

## 2021-08-26 DIAGNOSIS — R079 Chest pain, unspecified: Secondary | ICD-10-CM

## 2021-08-26 DIAGNOSIS — R778 Other specified abnormalities of plasma proteins: Secondary | ICD-10-CM | POA: Diagnosis not present

## 2021-08-26 DIAGNOSIS — J9601 Acute respiratory failure with hypoxia: Secondary | ICD-10-CM | POA: Diagnosis present

## 2021-08-26 DIAGNOSIS — J40 Bronchitis, not specified as acute or chronic: Secondary | ICD-10-CM | POA: Diagnosis not present

## 2021-08-26 DIAGNOSIS — J90 Pleural effusion, not elsewhere classified: Secondary | ICD-10-CM

## 2021-08-26 DIAGNOSIS — Z9889 Other specified postprocedural states: Secondary | ICD-10-CM

## 2021-08-26 DIAGNOSIS — N644 Mastodynia: Secondary | ICD-10-CM | POA: Diagnosis present

## 2021-08-26 DIAGNOSIS — I110 Hypertensive heart disease with heart failure: Secondary | ICD-10-CM | POA: Diagnosis present

## 2021-08-26 DIAGNOSIS — I5041 Acute combined systolic (congestive) and diastolic (congestive) heart failure: Secondary | ICD-10-CM | POA: Diagnosis present

## 2021-08-26 DIAGNOSIS — D259 Leiomyoma of uterus, unspecified: Secondary | ICD-10-CM | POA: Diagnosis present

## 2021-08-26 DIAGNOSIS — I080 Rheumatic disorders of both mitral and aortic valves: Secondary | ICD-10-CM | POA: Diagnosis present

## 2021-08-26 DIAGNOSIS — R0789 Other chest pain: Secondary | ICD-10-CM | POA: Diagnosis present

## 2021-08-26 DIAGNOSIS — E876 Hypokalemia: Secondary | ICD-10-CM | POA: Diagnosis present

## 2021-08-26 DIAGNOSIS — E7800 Pure hypercholesterolemia, unspecified: Secondary | ICD-10-CM | POA: Diagnosis present

## 2021-08-26 DIAGNOSIS — I447 Left bundle-branch block, unspecified: Secondary | ICD-10-CM | POA: Diagnosis present

## 2021-08-26 DIAGNOSIS — I5021 Acute systolic (congestive) heart failure: Secondary | ICD-10-CM | POA: Diagnosis not present

## 2021-08-26 DIAGNOSIS — I2510 Atherosclerotic heart disease of native coronary artery without angina pectoris: Secondary | ICD-10-CM | POA: Diagnosis present

## 2021-08-26 DIAGNOSIS — G8929 Other chronic pain: Secondary | ICD-10-CM | POA: Diagnosis present

## 2021-08-26 DIAGNOSIS — Z8249 Family history of ischemic heart disease and other diseases of the circulatory system: Secondary | ICD-10-CM | POA: Diagnosis not present

## 2021-08-26 DIAGNOSIS — I428 Other cardiomyopathies: Secondary | ICD-10-CM | POA: Diagnosis not present

## 2021-08-26 DIAGNOSIS — J45909 Unspecified asthma, uncomplicated: Secondary | ICD-10-CM | POA: Diagnosis present

## 2021-08-26 DIAGNOSIS — E871 Hypo-osmolality and hyponatremia: Secondary | ICD-10-CM | POA: Diagnosis present

## 2021-08-26 LAB — ECHOCARDIOGRAM COMPLETE
AR max vel: 1.97 cm2
AV Area VTI: 1.89 cm2
AV Area mean vel: 2.07 cm2
AV Mean grad: 2 mmHg
AV Peak grad: 3.8 mmHg
Ao pk vel: 0.97 m/s
Area-P 1/2: 7.7 cm2
Calc EF: 35.3 %
Height: 62 in
MV M vel: 4.74 m/s
MV Peak grad: 89.9 mmHg
P 1/2 time: 134 msec
S' Lateral: 4.25 cm
Single Plane A2C EF: 26.4 %
Single Plane A4C EF: 43 %
Weight: 2000 oz

## 2021-08-26 LAB — BASIC METABOLIC PANEL
Anion gap: 8 (ref 5–15)
BUN: 13 mg/dL (ref 8–23)
CO2: 25 mmol/L (ref 22–32)
Calcium: 8.9 mg/dL (ref 8.9–10.3)
Chloride: 99 mmol/L (ref 98–111)
Creatinine, Ser: 0.63 mg/dL (ref 0.44–1.00)
GFR, Estimated: >60 mL/min (ref >60)
Glucose, Bld: 184 mg/dL — ABNORMAL HIGH (ref 70–99)
Potassium: 3.8 mmol/L (ref 3.5–5.1)
Sodium: 132 mmol/L — ABNORMAL LOW (ref 135–145)

## 2021-08-26 LAB — TROPONIN I (HIGH SENSITIVITY)
Troponin I (High Sensitivity): 33 ng/L — ABNORMAL HIGH (ref <18)
Troponin I (High Sensitivity): 31 ng/L — ABNORMAL HIGH (ref <18)

## 2021-08-26 LAB — SEDIMENTATION RATE: Sed Rate: 20 mm/hr (ref 0–30)

## 2021-08-26 LAB — BRAIN NATRIURETIC PEPTIDE: B Natriuretic Peptide: 2082.6 pg/mL — ABNORMAL HIGH (ref 0.0–100.0)

## 2021-08-26 LAB — D-DIMER, QUANTITATIVE: D-Dimer, Quant: 0.69 ug/mL-FEU — ABNORMAL HIGH (ref 0.00–0.50)

## 2021-08-26 LAB — MAGNESIUM: Magnesium: 2 mg/dL (ref 1.7–2.4)

## 2021-08-26 LAB — HIV ANTIBODY (ROUTINE TESTING W REFLEX): HIV Screen 4th Generation wRfx: NONREACTIVE

## 2021-08-26 MED ORDER — PREDNISONE 20 MG PO TABS
40.0000 mg | ORAL_TABLET | Freq: Every day | ORAL | Status: DC
  Administered 2021-08-26 – 2021-08-28 (×3): 40 mg via ORAL
  Filled 2021-08-26 (×3): qty 2

## 2021-08-26 MED ORDER — CYCLOBENZAPRINE HCL 10 MG PO TABS
5.0000 mg | ORAL_TABLET | Freq: Three times a day (TID) | ORAL | Status: DC | PRN
  Administered 2021-08-26 – 2021-08-28 (×4): 5 mg via ORAL
  Filled 2021-08-26 (×4): qty 1

## 2021-08-26 MED ORDER — AZITHROMYCIN 250 MG PO TABS
500.0000 mg | ORAL_TABLET | Freq: Every day | ORAL | Status: DC
  Administered 2021-08-26 – 2021-08-28 (×3): 500 mg via ORAL
  Filled 2021-08-26 (×2): qty 2
  Filled 2021-08-26: qty 1

## 2021-08-26 MED ORDER — ONDANSETRON HCL 4 MG/2ML IJ SOLN: 4.0000 mg | Freq: Four times a day (QID) | INTRAMUSCULAR | Status: DC | PRN

## 2021-08-26 MED ORDER — IPRATROPIUM-ALBUTEROL 0.5-2.5 (3) MG/3ML IN SOLN: 3.0000 mL | RESPIRATORY_TRACT | Status: DC | PRN

## 2021-08-26 MED ORDER — FUROSEMIDE 10 MG/ML IJ SOLN
40.0000 mg | Freq: Two times a day (BID) | INTRAMUSCULAR | Status: DC
  Administered 2021-08-26 – 2021-08-30 (×9): 40 mg via INTRAVENOUS
  Filled 2021-08-26 (×9): qty 4

## 2021-08-26 NOTE — Evaluation (Signed)
Occupational Therapy Evaluation Patient Details Name: Glenda May MRN: [id number] DOB: 1945-02-23 Today's Date: 08/26/2021   History of Present Illness presented to ER secondary to worsening SOB, chest and back pain; admitted for management of acute respiratory disease related to bronchitis.  Chest CT negative for PE; per cardiology consult, chest pain likely demand ischemia related to viral illness (no ACS)   Clinical Impression   Glenda May presents with reduced endurance and moderate R-sided back and chest pain. She lives with her son in a level-entry home, reports she is active at home and in community, drives, is IND in BADL and IADL, ambulates without AD, and denies falls history. During today's evaluation, she reports no pain initially, while seated in recliner and visiting with friends. She is able to engage in transfers, UB and LB dressing, ambulation, toileting, grooming, all with Mod I/SUPV and w/o AD, no LOB, and with O2 sats remaining at 95% or greater throughout session on room air. At end of session, after ~ 150' ambulation in hallway and engagement in ADLs, pt reports pain now increased to 4/10. Ice applied. Provided educ re: activity modifications, energy conservation strategies, non-pharmacological pain mgmt strategies. Pt verbalizes understanding. Pt and therapist in agreement that pt is at/near baseline level of fxl mobility and ADL performance and that no additional OT services are required at this time.    Recommendations for follow up therapy are one component of a multi-disciplinary discharge planning process, led by the attending physician.  Recommendations may be updated based on patient status, additional functional criteria and insurance authorization.   Follow Up Recommendations  No OT follow up    Assistance Recommended at Discharge PRN  Patient can return home with the following      Functional Status Assessment  Patient has had a recent decline in their  functional status and demonstrates the ability to make significant improvements in function in a reasonable and predictable amount of time.  Equipment Recommendations  None recommended by OT    Recommendations for Other Services       Precautions / Restrictions Precautions Precautions: Fall Precaution Comments: Mod fall risk Restrictions Weight Bearing Restrictions: No      Mobility Bed Mobility Overal bed mobility: Independent                  Transfers Overall transfer level: Needs assistance Equipment used: None Transfers: Sit to/from Stand Sit to Stand: Supervision           General transfer comment: SUPV for safety      Balance Overall balance assessment: Needs assistance Sitting-balance support: No upper extremity supported, Feet supported Sitting balance-Leahy Scale: Good     Standing balance support: During functional activity, No upper extremity supported Standing balance-Leahy Scale: Good                             ADL either performed or assessed with clinical judgement   ADL Overall ADL's : Needs assistance/impaired     Grooming: Wash/dry hands;Standing;Modified independent           Upper Body Dressing : Modified independent;Standing   Lower Body Dressing: Modified independent;Sit to/from stand   Toilet Transfer: Supervision/safety;Comfort height toilet;Ambulation Toilet Transfer Details (indicate cue type and reason): SUPV for safety Toileting- Clothing Manipulation and Hygiene: Modified independent;Sitting/lateral lean               Vision  Perception     Praxis      Pertinent Vitals/Pain Pain Assessment Pain Assessment: 0-10 Pain Score: 4  Pain Location: R side of chest, R side of upper and lower back Pain Descriptors / Indicators: Grimacing, Guarding Pain Intervention(s): Ice applied, Repositioned, Limited activity within patient's tolerance, Monitored during session     Hand Dominance      Extremity/Trunk Assessment Upper Extremity Assessment Upper Extremity Assessment: Overall WFL for tasks assessed   Lower Extremity Assessment Lower Extremity Assessment: Overall WFL for tasks assessed   Cervical / Trunk Assessment Cervical / Trunk Assessment: Normal   Communication Communication Communication: No difficulties   Cognition Arousal/Alertness: Awake/alert Behavior During Therapy: WFL for tasks assessed/performed Overall Cognitive Status: Within Functional Limits for tasks assessed                                       General Comments       Exercises Other Exercises Other Exercises: Educ re: energy conservation strategies, rehab process, gradual return to previous level of activity, pain mgmt strategies   Shoulder Instructions      Home Living Family/patient expects to be discharged to:: Private residence Living Arrangements: Children Available Help at Discharge: Family;Available PRN/intermittently Type of Home: House Home Access: Level entry     Home Layout: Two level;Able to live on main level with bedroom/bathroom     Bathroom Shower/Tub: Teacher, early years/pre: Standard     Home Equipment: None          Prior Functioning/Environment Prior Level of Function : Independent/Modified Independent             Mobility Comments: Ambulates at home and in community w/o AD, no falls in previous 12 months ADLs Comments: IND with BADL/IADLs, drives, exercises at [facility], cooks/cleans/shops for self and son        OT Problem List: Decreased activity tolerance;Pain;Decreased strength      OT Treatment/Interventions:      OT Goals(Current goals can be found in the care plan section) Acute Rehab OT Goals Patient Stated Goal: to get back to regular exercise sessions at [facility] OT Goal Formulation: With patient Time For Goal Achievement: 09/09/21 Potential to Achieve Goals: Good  OT Frequency:      Co-evaluation               AM-PAC OT "6 Clicks" Daily Activity     Outcome Measure Help from another person eating meals?: None Help from another person taking care of personal grooming?: None Help from another person toileting, which includes using toliet, bedpan, or urinal?: None Help from another person bathing (including washing, rinsing, drying)?: A Little Help from another person to put on and taking off regular upper body clothing?: None Help from another person to put on and taking off regular lower body clothing?: None 6 Click Score: 23   End of Session    Activity Tolerance: Patient tolerated treatment well Patient left: in chair;with call bell/phone within reach;with family/visitor present  OT Visit Diagnosis: Pain;Muscle weakness (generalized) (M62.81)                Time: 2774-1287 OT Time Calculation (min): 21 min Charges:  OT General Charges $OT Visit: 1 Visit OT Evaluation $OT Eval Low Complexity: 1 Low OT Treatments $Self Care/Home Management : 8-22 mins Josiah Lobo, PhD, MS, OTR/L 08/26/21, 4:07 PM

## 2021-08-26 NOTE — ED Notes (Signed)
Patient resting in bed free from sign of distress. Breathing unlabored speaking in full sentences with symmetric chest rise and fall. Bed low and locked with side rails raised x2. Call bell in reach and monitor in place.   

## 2021-08-26 NOTE — Evaluation (Signed)
Physical Therapy Evaluation Patient Details Name: Glenda May MRN: [id number] DOB: [birth year] Today's Date: 08/26/2021  History of Present Illness  presented to ER secondary to worsening SOB, chest and back pain; admitted for management of acute respiratory disease related to bronchitis.  Chest CT negative for PE; per cardiology consult, chest pain likely demand ischemia related to viral illness (no ACS)  Clinical Impression  Patient modified long-sitting in bed, endorsing back pain/spasm (FACES 6/10) upon arrival to room. Per RN, meds recently administered.  Patient agreeable to change position/mobilize in an attempt to relieve pain.  Guarded trunk posturing with all movement attempts; may benefit from stretching/soft tissue mobilization to reduce spasm in subsequent sessions (unable to tolerate today).  Able to complete bed mobility with indep; sit/stand, basic transfers and gait (120') without assist device, cga/close sup.  Demonstrates reciprocal stepping pattern, generally guarded trunk rotation and arm swing (due to back pain). Slow, guarded cadence, but no overt buckling or LOB. Mod SOB with exertion; sats >93% on RA Left on RA end of session; RN informed/aware.   Would benefit from skilled PT to address above deficits and promote optimal return to PLOF.; Recommend transition to HHPT upon discharge from acute hospitalization.      Recommendations for follow up therapy are one component of a multi-disciplinary discharge planning process, led by the attending physician.  Recommendations may be updated based on patient status, additional functional criteria and insurance authorization.  Follow Up Recommendations Home health PT      Assistance Recommended at Discharge PRN  Patient can return home with the following  A little help with walking and/or transfers;A little help with bathing/dressing/bathroom    Equipment Recommendations  (no equip needed at this time)  Recommendations  for Other Services       Functional Status Assessment Patient has had a recent decline in their functional status and demonstrates the ability to make significant improvements in function in a reasonable and predictable amount of time.     Precautions / Restrictions Precautions Precautions: Fall Restrictions Weight Bearing Restrictions: No      Mobility  Bed Mobility Overal bed mobility: Independent                  Transfers Overall transfer level: Needs assistance Equipment used: None Transfers: Sit to/from Stand Sit to Stand: Supervision                Ambulation/Gait Ambulation/Gait assistance: Min guard, Supervision Gait Distance (Feet): 120 Feet Assistive device: IV Pole         General Gait Details: reciprocal stepping pattern, generally guarded trunk rotation and arm swing (due to back pain). Slow, guarded cadence, but no overt buckling or LOB. Mod SOB with exertion; sats >93% on RA  Stairs            Wheelchair Mobility    Modified Rankin (Stroke Patients Only)       Balance Overall balance assessment: Needs assistance Sitting-balance support: No upper extremity supported, Feet supported Sitting balance-Leahy Scale: Good     Standing balance support: Bilateral upper extremity supported Standing balance-Leahy Scale: Fair                               Pertinent Vitals/Pain Pain Assessment Pain Assessment: Faces Faces Pain Scale: Hurts even more Pain Location: back Pain Descriptors / Indicators: Grimacing, Guarding, Moaning Pain Intervention(s): Limited activity within patient's tolerance, Monitored during session, Premedicated before  session, Repositioned    Home Living Family/patient expects to be discharged to:: Private residence Living Arrangements: Children Available Help at Discharge: Family;Available PRN/intermittently Type of Home: House Home Access: Level entry       Home Layout: Two level;Able to  live on main level with bedroom/bathroom Home Equipment: None      Prior Function Prior Level of Function : Independent/Modified Independent             Mobility Comments: Indep with ADLs, household and community mobilization without assist device; denies fall history; no home O2.       Hand Dominance        Extremity/Trunk Assessment   Upper Extremity Assessment Upper Extremity Assessment: Overall WFL for tasks assessed    Lower Extremity Assessment Lower Extremity Assessment: Overall WFL for tasks assessed (grossly at least 4/5 throughout)       Communication   Communication: No difficulties  Cognition Arousal/Alertness: Awake/alert Behavior During Therapy: WFL for tasks assessed/performed Overall Cognitive Status: Within Functional Limits for tasks assessed                                          General Comments      Exercises     Assessment/Plan    PT Assessment Patient needs continued PT services  PT Problem List Decreased activity tolerance;Decreased balance;Decreased mobility;Cardiopulmonary status limiting activity;Pain       PT Treatment Interventions DME instruction;Gait training;Stair training;Functional mobility training;Therapeutic activities;Therapeutic exercise;Balance training;Patient/family education    PT Goals (Current goals can be found in the Care Plan section)  Acute Rehab PT Goals Patient Stated Goal: to make this pain better PT Goal Formulation: With patient Time For Goal Achievement: 09/09/21 Potential to Achieve Goals: Good    Frequency Min 2X/week     Co-evaluation               AM-PAC PT "6 Clicks" Mobility  Outcome Measure Help needed turning from your back to your side while in a flat bed without using bedrails?: None Help needed moving from lying on your back to sitting on the side of a flat bed without using bedrails?: None Help needed moving to and from a bed to a chair (including a  wheelchair)?: None Help needed standing up from a chair using your arms (e.g., wheelchair or bedside chair)?: None Help needed to walk in hospital room?: A Little Help needed climbing 3-5 steps with a railing? : A Little 6 Click Score: 22    End of Session Equipment Utilized During Treatment: Gait belt Activity Tolerance: Patient tolerated treatment well Patient left: in chair;with call bell/phone within reach;with family/visitor present Nurse Communication: Mobility status PT Visit Diagnosis: Difficulty in walking, not elsewhere classified (R26.2)    Time: 0093-8182 PT Time Calculation (min) (ACUTE ONLY): 16 min   Charges:   PT Evaluation $PT Eval Moderate Complexity: 1 Mod        Barrett Goldie H. Owens Shark, PT, DPT, NCS 08/26/21, 12:26 PM [phone]

## 2021-08-26 NOTE — ED Notes (Signed)
Patient resting in bed free from sign of distress. Breathing unlabored  with symmetric chest rise and fall. Bed low and locked with side rails raised x2. Call bell in reach and monitor in place.   

## 2021-08-26 NOTE — H&P (Addendum)
History and Physical    Patient: Glenda May DOB: 03/14/1945 DOA: 08/25/2021 DOS: the patient was seen and examined on 08/26/2021 PCP: [name], MD  Patient coming from: Home  Chief Complaint:  Chief Complaint  Patient presents with   Shortness of Breath   Back Pain   Chest Pain    HPI: Glenda May is a 76 y.o. female with medical history significant for Hypertension and seasonal allergies who presents to the ED for evaluation of chest pain.  Patient was being treated as an outpatient for possible bronchitis after complaining of sinus congestion, postnasal drip and typical allergy symptoms for the past 2 weeks.  Over the course of the last few days she developed right-sided chest pain of severe intensity radiating from the mid right back to the anterior chest, worse on coughing.  She states her allergy symptoms are improving but the chest pain is worsening.  She denies fever or chills or shortness of breath.  Denies nausea or vomiting.  Denies abdominal pain or change in bowel habits. ED course and data review: Tachycardic to 112, tachypneic 22-30 with normal blood pressures and oxygen saturation.  Labs showing troponin 15-22.  Normal TSH.  WBC normal, hemoglobin 11.5, sodium 131, glucose 246, AST 45.  Sodium 131, COVID and flu negative.  EKG, personally viewed and interpreted with sinus rhythm at 87, prolonged QT interval. CTA chest showed the following findings: IMPRESSION: 1. No acute thoracic abnormality. 2. No pulmonary embolus. 3. Couple right upper lobe pulmonary nodules measuring 7 and 8 mm. Right middle lobe subpleural 8 mm ground-glass nodule. Initial follow-up with CT at 6 months is recommended to confirm persistence. If persistent, repeat CT is recommended every 2 years until 5 years of stability has been established. This recommendation follows the consensus statement: Guidelines for Management of Incidental Pulmonary Nodules Detected on CT  Images: From the Fleischner Society 2017; Radiology 2017; 284:228-243. 4. Small right and trace left pleural effusions. 5. Cardiomegaly. 6. Couple nonspecific sclerotic regions of the sternum. Correlate with history of prior nondisplaced fractures versus history of metastatic malignancy. Recommend attention on follow-up. 7. Colonic diverticulosis with no acute diverticulitis. 8. Likely uterine fibroid.   Patient was treated with albuterol, methylprednisolone, IV morphine in the ED but continued to have sharp chest pains.  Hospitalist consulted for admission.       Past Medical History:  Diagnosis Date   High cholesterol    Hypertension    History reviewed. No pertinent surgical history. Social History:  reports that she has never smoked. She does not have any smokeless tobacco history on file. She reports that she does not currently use alcohol. She reports that she does not use drugs.  No Known Allergies  Family History  Problem Relation Age of Onset   Breast cancer Neg Hx     Prior to Admission medications   Medication Sig Start Date End Date Taking? Authorizing Provider  cyproheptadine (PERIACTIN) 4 MG tablet Take 1 tablet (4 mg total) by mouth 3 (three) times daily as needed for allergies. 06/30/15   Sable Feil, PA-C  methocarbamol (ROBAXIN) 500 MG tablet Take 1 tablet (500 mg total) by mouth 4 (four) times daily. 06/11/20   Darletta Moll, PA-C    Physical Exam: Vitals:   08/25/21 2000 08/25/21 2030 08/25/21 2100 08/25/21 2221  BP: 122/62 131/75 132/71   Pulse: 96 (!) 102 (!) 105   Resp: 20 (!) 22 (!) 30   Temp:  97.6 F (36.4 C)  TempSrc:    Oral  SpO2: 90% 98% 99%   Weight:      Height:       Physical Exam  Labs on Admission: I have personally reviewed following labs and imaging studies  CBC: Recent Labs  Lab 08/25/21 1638  WBC 6.6  NEUTROABS 5.7  HGB 11.5*  HCT 34.9*  MCV 92.8  PLT 696   Basic Metabolic Panel: Recent Labs  Lab  08/25/21 1638  NA 131*  K 3.5  CL 98  CO2 21*  GLUCOSE 246*  BUN 14  CREATININE 0.84  CALCIUM 9.3   GFR: Estimated Creatinine Clearance: 44.4 mL/min (by C-G formula based on SCr of 0.84 mg/dL). Liver Function Tests: Recent Labs  Lab 08/25/21 1638  AST 45*  ALT 35  ALKPHOS 81  BILITOT 0.7  PROT 7.5  ALBUMIN 4.1   No results for input(s): "LIPASE", "AMYLASE" in the last 168 hours. No results for input(s): "AMMONIA" in the last 168 hours. Coagulation Profile: No results for input(s): "INR", "PROTIME" in the last 168 hours. Cardiac Enzymes: No results for input(s): "CKTOTAL", "CKMB", "CKMBINDEX", "TROPONINI" in the last 168 hours. BNP (last 3 results) No results for input(s): "PROBNP" in the last 8760 hours. HbA1C: No results for input(s): "HGBA1C" in the last 72 hours. CBG: No results for input(s): "GLUCAP" in the last 168 hours. Lipid Profile: No results for input(s): "CHOL", "HDL", "LDLCALC", "TRIG", "CHOLHDL", "LDLDIRECT" in the last 72 hours. Thyroid Function Tests: Recent Labs    08/25/21 1638  TSH 1.052   Anemia Panel: No results for input(s): "VITAMINB12", "FOLATE", "FERRITIN", "TIBC", "IRON", "RETICCTPCT" in the last 72 hours. Urine analysis: No results found for: "COLORURINE", "APPEARANCEUR", "LABSPEC", "PHURINE", "GLUCOSEU", "HGBUR", "BILIRUBINUR", "KETONESUR", "PROTEINUR", "UROBILINOGEN", "NITRITE", "LEUKOCYTESUR"  Radiological Exams on Admission: CT Angio Chest/Abd/Pel for Dissection W and/or Wo Contrast  Result Date: 08/25/2021 CLINICAL DATA:  Acute aortic syndrome (AAS) suspected EXAM: CT ANGIOGRAPHY CHEST, ABDOMEN AND PELVIS TECHNIQUE: Non-contrast CT of the chest was initially obtained. Multidetector CT imaging through the chest, abdomen and pelvis was performed using the standard protocol during bolus administration of intravenous contrast. Multiplanar reconstructed images and MIPs were obtained and reviewed to evaluate the vascular anatomy. RADIATION  DOSE REDUCTION: This exam was performed according to the departmental dose-optimization program which includes automated exposure control, adjustment of the mA and/or kV according to patient size and/or use of iterative reconstruction technique. CONTRAST:  55m OMNIPAQUE IOHEXOL 350 MG/ML SOLN COMPARISON:  None Available. FINDINGS: CTA CHEST FINDINGS Cardiovascular: Satisfactory opacification of the pulmonary arteries to the segmental level. No evidence of pulmonary embolism. The main pulmonary artery is normal in caliber. Enlarged heart size. No significant pericardial effusion. The thoracic aorta aneurysm or dissection. Mild atherosclerotic plaque of the thoracic aorta. No coronary artery calcifications. Mediastinum/Nodes: No enlarged mediastinal, hilar, or axillary lymph nodes. Thyroid gland, trachea, and esophagus demonstrate no significant findings. Lungs/Pleura: Biapical pleural/pulmonary scarring. Passive atelectasis the bilateral lower lobes. No focal consolidation. Couple right upper lobe pulmonary nodules measuring 7 and 8 mm. Right middle lobe subpleural 8 mm ground-glass nodule (5:60, 9:27). No pulmonary mass. Lingular atelectasis versus scarring. Small right and trace left pleural effusions. No pneumothorax. Musculoskeletal: No chest wall abnormality. Couple nonspecific sclerotic regions of the sternum (9:70, 73). No acute displaced fracture. Multilevel degenerative changes of the spine. Review of the MIP images confirms the above findings. CTA ABDOMEN AND PELVIS FINDINGS VASCULAR Aorta: Mild atherosclerotic plaque. Normal caliber aorta without aneurysm, dissection, vasculitis or  significant stenosis. Celiac: Patent without evidence of aneurysm, dissection, vasculitis or significant stenosis. SMA: Patent without evidence of aneurysm, dissection, vasculitis or significant stenosis. Renals: Both renal arteries are patent without evidence of aneurysm, dissection, vasculitis, fibromuscular dysplasia or  significant stenosis. IMA: Patent without evidence of aneurysm, dissection, vasculitis or significant stenosis. Inflow: Mild to moderate atherosclerotic plaque. Patent without evidence of aneurysm, dissection, vasculitis or significant stenosis. Veins: No obvious venous abnormality within the limitations of this arterial phase study. Review of the MIP images confirms the above findings. NON-VASCULAR Hepatobiliary: No focal liver abnormality. Status post cholecystectomy. No biliary dilatation. Pancreas: No focal lesion. Normal pancreatic contour. No surrounding inflammatory changes. No main pancreatic ductal dilatation. Spleen: Normal in size without focal abnormality. Adrenals/Urinary Tract: No adrenal nodule bilaterally. Bilateral kidneys enhance symmetrically. No hydronephrosis. No hydroureter. The urinary bladder is unremarkable. Stomach/Bowel: Stomach is within normal limits. No evidence of bowel wall thickening or dilatation. Colonic diverticulosis. Appendix appears normal. Lymphatic: No lymphadenopathy. Reproductive: Posterior right lobulation of the uterus with query underlying fibroid. Otherwise uterus and bilateral adnexa are unremarkable. Other: No intraperitoneal free fluid. No intraperitoneal free gas. No organized fluid collection. Musculoskeletal: No abdominal wall hernia or abnormality. No suspicious lytic or blastic osseous lesions. No acute displaced fracture. Intervertebral disc space vacuum phenomenon at the L5-S1 level. Review of the MIP images confirms the above findings. IMPRESSION: 1. No acute thoracic abnormality. 2. No pulmonary embolus. 3. Couple right upper lobe pulmonary nodules measuring 7 and 8 mm. Right middle lobe subpleural 8 mm ground-glass nodule. Initial follow-up with CT at 6 months is recommended to confirm persistence. If persistent, repeat CT is recommended every 2 years until 5 years of stability has been established. This recommendation follows the consensus statement:  Guidelines for Management of Incidental Pulmonary Nodules Detected on CT Images: From the Fleischner Society 2017; Radiology 2017; 284:228-243. 4. Small right and trace left pleural effusions. 5. Cardiomegaly. 6. Couple nonspecific sclerotic regions of the sternum. Correlate with history of prior nondisplaced fractures versus history of metastatic malignancy. Recommend attention on follow-up. 7. Colonic diverticulosis with no acute diverticulitis. 8. Likely uterine fibroid. Electronically Signed   By: Iven Finn M.D.   On: 08/25/2021 18:56   DG Chest 2 View  Result Date: 08/25/2021 CLINICAL DATA:  Cough and dyspnea EXAM: CHEST - 2 VIEW COMPARISON:  None available FINDINGS: Low lung volumes. Borderline cardiomegaly though evaluation is limited by low lung volumes. Pulmonary vascular congestion. Hazy airspace and interstitial opacities in the lower lungs. Small bilateral pleural effusions. No pneumothorax. Aortic calcification. IMPRESSION: Airspace and interstitial opacities in the lower lungs favored to represent pulmonary edema though infection is not excluded. Small bilateral pleural effusions. Aortic Atherosclerosis (ICD10-I70.0). Electronically Signed   By: Placido Sou M.D.   On: 08/25/2021 17:35     Data Reviewed: Relevant notes from primary care and specialist visits, past discharge summaries as available in EHR, including Care Everywhere. Prior diagnostic testing as pertinent to current admission diagnoses Updated medications and problem lists for reconciliation ED course, including vitals, labs, imaging, treatment and response to treatment Triage notes, nursing and pharmacy notes and ED provider's notes Notable results as noted in HPI   Assessment and Plan: * Chest pain Suspect chest pain is related to recent acute bronchitis however differential includes ischemic chest pain, viral myocarditis, as well as chest pain related to pulmonary nodules and sclerotic bone lesions of sternum  seen on CT CTA aorta was negative for PE or acute thoracic abnormality - We  will treat acute bronchitis as outlined  - We will continue to trend troponins to evaluate for ACS - Get sed rate, echo, BNP to further evaluate for myocarditis/cardiomyopathy based on findings of pleural effusion and cardiomegaly in the setting of recent respiratory tract infection -Pain control for right-sided atypical chest pain -Follow-up CT chest as outpatient which showed couple nonspecific sclerotic regions of the sternum. Correlate with history of prior nondisplaced fractures versus history ofmetastatic malignancy. Recommend attention on follow-up.  Elevated troponin Troponin 15-22, nonspecific changes on EKG.  Chest x-ray showed trace left pleural effusions and cardiomegaly Chest pain is atypical - We will continue to trend troponins and add on BNP as well as sed rate to evaluate for viral myocarditis - Empiric NSAID - We will get an echocardiogram  Acute CHF (congestive heart failure) (HCC) Possible acute CHF given shortness of breath, BNP resulting postadmission over 2000, pleural effusions on chest x-ray Echocardiogram already ordered Daily weights with intake and output monitoring We will try Lasix IV and assess for symptomatic relief from shortness of breath, Cardiology consult  Sinus tachycardia TSH was checked and was normal.  Was tachycardia to the 1 teens Suspect related to acute pain CTA was negative for PE Pain control  Hypoxia Was mildly hypoxic to the low 90s in the ED and required 2 L Wean as tolerated  Acute bronchitis Chest x-ray showed airspace and interstitial opacities in the lower lungs favored to represent pulmonary edema though infection is not excluded. - We will treat with Z-Pak, DuoNeb as needed and oral prednisone  Hyperglycemia Blood sugar was 246 with no history of diabetes We will get an A1c  History of lumbar surgery No evidence of bony disease on  CTA  Hypertension Continue home meds pending med rec  Multiple pulmonary nodules CTA showed: Couple right upper lobe pulmonary nodules measuring 7 and 8 mm.Right middle lobe subpleural 8 mm ground-glass nodule. Initial follow-up with CT at 6 months is recommended to confirm persistence. If persistent, repeat CT is recommended every 2 years until 5 yearsof stability has been established        DVT prophylaxis: Lovenox  Consults: cardiology   Advance Care Planning:   Code Status: Full Code   Family Communication: Son and significant other at bedside  Disposition Plan: Back to previous home environment  Severity of Illness: The appropriate patient status for this patient is OBSERVATION. Observation status is judged to be reasonable and necessary in order to provide the required intensity of service to ensure the patient's safety. The patient's presenting symptoms, physical exam findings, and initial radiographic and laboratory data in the context of their medical condition is felt to place them at decreased risk for further clinical deterioration. Furthermore, it is anticipated that the patient will be medically stable for discharge from the hospital within 2 midnights of admission.   Author: Athena Masse, MD 08/26/2021 12:51 AM  For on call review [email or web address].

## 2021-08-26 NOTE — Progress Notes (Signed)
PT Cancellation Note  Patient Details Name: Glenda May MRN: [id number] DOB: [birth year]   Cancelled Treatment:    Reason Eval/Treat Not Completed: Medical issues which prohibited therapy (Consult received and chart reviewed. Patient noted with mildly elevated troponins, recurrent chest pain; pending cardiology consult and trending troponins.  Will hold exertional activity and initiate PT evaluation pending medical course/readiness.)  Honest Vanleer H. Owens Shark, PT, DPT, NCS 08/26/21, 10:04 AM [phone]

## 2021-08-26 NOTE — Assessment & Plan Note (Signed)
Continue home meds pending med rec 

## 2021-08-26 NOTE — Assessment & Plan Note (Addendum)
Troponin 15-22, nonspecific changes on EKG.  Chest x-ray showed trace left pleural effusions and cardiomegaly Chest pain is atypical - We will continue to trend troponins and add on BNP as well as sed rate to evaluate for viral myocarditis - Empiric NSAID - We will get an echocardiogram

## 2021-08-26 NOTE — Consult Note (Signed)
Cardiology Consult    Patient ID: Glenda May MRN: [id number], DOB/AGE: 07-01-1945   Admit date: 08/25/2021 Date of Consult: 08/26/2021  Primary Physician: [name], MD Primary Cardiologist: Ida Rogue, MD - new Requesting Provider: Otelia Santee, MD  Patient Profile    Glenda May is a 76 y.o. female with a history of allergic rhinits, HTN, and HL, who is being seen today for the evaluation of right sided chest pain, dyspnea, and CHF at the request of Dr. Reesa Chew.  Past Medical History   Past Medical History:  Diagnosis Date   Allergic rhinitis    High cholesterol    Hypertension     Past Surgical History:  Procedure Laterality Date   APPENDECTOMY     CHOLECYSTECTOMY     LUMBAR LAMINECTOMY     L4-5   TONSILLECTOMY       Allergies  No Known Allergies  History of Present Illness    76 y/o ?  With a history of allergic rhinitis, hypertension, and hyperlipidemia.  She has no prior cardiac history.  She has a somewhat prolonged history of lower back pain for which she has been using etodolac at home.  Approximately 2 wks ago she developed cough w/ yellow-tinged sputum, chest and nasal congestion, and rhinorrhea.  In this setting, she worsening of right lower back pain and tenderness and she also began to experience right lateral chest discomfort that has been worse with coughing, upper body position changes, and deep breathing.  She tried multiple over the counter agents including claritin D and mucinex w/o improvement in symptoms.  COVID neg by home test last week. Over the course of her illness, she has noted progressive DOE and wheezing.  She saw her PCP on 8/15.  She was dx w/ asthmatic bronchitis and rx kenalog '60mg'$  IM x 1, prednisone, and azithromycin.  Unfortunately, symptoms worsened on August 16, prompting ED evaluation.  Here, high-sensitivity troponin has been mildly elevated at 15  22 33.  ECG showed sinus tachycardia 110 bpm with right atrial  enlargement and left bundle branch block, which was new since 2006.  BNP high at 2082.6.  CXR w/ small bilat pl effusions, cardiomegaly, and pulm vasc congestion.  CTA chest neg for PE, but showed 7 to 8 mm right upper lobe pulmonary nodules and an 8 mm right middle lobe groundglass nodule, with recommendation for follow-up CT in 6 months.  Small right and trace left pleural effusions were also noted.  Nonspecific sclerotic regions of the sternum, colonic diverticulosis, and likely uterine fibroid were also noted.  She has been treated with albuterol, Solu-Medrol, azithromycin, and intravenous Lasix.  In addition, she has received intravenous morphine related to ongoing back pain.  Chest pain has only been present in the setting of coughing or upper body movements low back pain remains constant.  Inpatient Medications     aspirin EC  81 mg Oral Daily   azithromycin  500 mg Oral Daily   enoxaparin (LOVENOX) injection  40 mg Subcutaneous Q24H   furosemide  40 mg Intravenous Q12H   predniSONE  40 mg Oral Q breakfast    Family History    Family History  Problem Relation Age of Onset   Stroke Mother        died @ 46   Dementia Mother    Stroke Father        died @ 68   Stroke Sister    Heart attack Brother  died @ 21   Breast cancer Neg Hx    She indicated that her mother is deceased. She indicated that her father is deceased. She indicated that her sister is alive. She indicated that her brother is deceased. She indicated that the status of her neg hx is unknown.   Social History    Social History   Socioeconomic History   Marital status: Single    Spouse name: Not on file   Number of children: Not on file   Years of education: Not on file   Highest education level: Not on file  Occupational History   Not on file  Tobacco Use   Smoking status: Never   Smokeless tobacco: Not on file  Substance and Sexual Activity   Alcohol use: Not Currently   Drug use: Never   Sexual  activity: Not Currently  Other Topics Concern   Not on file  Social History Narrative   Lives locally.  Does not routinely exercise.   Social Determinants of Health   Financial Resource Strain: Not on file  Food Insecurity: Not on file  Transportation Needs: Not on file  Physical Activity: Not on file  Stress: Not on file  Social Connections: Not on file  Intimate Partner Violence: Not on file     Review of Systems    General:  No chills, fever, night sweats or weight changes.  Cardiovascular:  +++ Right upper and lateral chest pain associated with deep breathing, coughing, and position changes. +++ dyspnea on exertion, no edema, orthopnea, palpitations, paroxysmal nocturnal dyspnea. Dermatological: No rash, lesions/masses Respiratory: +++ cough with yellow sputum, +++ dyspnea sometimes associated with wheezing. Urologic: No hematuria, dysuria Abdominal:   No nausea, vomiting, diarrhea, bright red blood per rectum, melena, or hematemesis Neurologic:  No visual changes, wkns, changes in mental status. All other systems reviewed and are otherwise negative except as noted above.  Physical Exam    Blood pressure 122/72, pulse 98, temperature 98 F (36.7 C), temperature source Oral, resp. rate (!) 27, height '5\' 2"'$  (1.575 m), weight 56.7 kg, SpO2 99 %.  General: Pleasant, NAD Psych: Normal affect. Neuro: Alert and oriented X 3. Moves all extremities spontaneously. HEENT: Normal  Neck: Supple without bruits or JVD. Lungs:  Resp regular and unlabored, diminished breath sounds bilaterally. Heart: RRR no s3, s4, or murmurs. Abdomen: Soft, non-tender, non-distended, BS + x 4.  Extremities: No clubbing, cyanosis or edema. DP/PT2+, Radials 2+ and equal bilaterally.  Labs    Cardiac Enzymes Recent Labs  Lab 08/25/21 1638 08/25/21 1835 08/26/21 0336  TROPONINIHS 15 22* 33*     BNP    Component Value Date/Time   BNP 2,082.6 (H) 08/26/2021 0140    ProBNP No results found  for: "PROBNP"  Lab Results  Component Value Date   WBC 6.6 08/25/2021   HGB 11.5 (L) 08/25/2021   HCT 34.9 (L) 08/25/2021   MCV 92.8 08/25/2021   PLT 232 08/25/2021    Recent Labs  Lab 08/25/21 1638 08/26/21 0336  NA 131* 132*  K 3.5 3.8  CL 98 99  CO2 21* 25  BUN 14 13  CREATININE 0.84 0.63  CALCIUM 9.3 8.9  PROT 7.5  --   BILITOT 0.7  --   ALKPHOS 81  --   ALT 35  --   AST 45*  --   GLUCOSE 246* 184*   No results found for: "CHOL", "HDL", "LDLCALC", "TRIG" Lab Results  Component Value Date   DDIMER 0.69 (  H) 08/26/2021      Radiology Studies    CT Angio Chest/Abd/Pel for Dissection W and/or Wo Contrast  Result Date: 08/25/2021 CLINICAL DATA:  Acute aortic syndrome (AAS) suspected EXAM: CT ANGIOGRAPHY CHEST, ABDOMEN AND PELVIS TECHNIQUE: Non-contrast CT of the chest was initially obtained. Multidetector CT imaging through the chest, abdomen and pelvis was performed using the standard protocol during bolus administration of intravenous contrast. Multiplanar reconstructed images and MIPs were obtained and reviewed to evaluate the vascular anatomy. RADIATION DOSE REDUCTION: This exam was performed according to the departmental dose-optimization program which includes automated exposure control, adjustment of the mA and/or kV according to patient size and/or use of iterative reconstruction technique. CONTRAST:  81m OMNIPAQUE IOHEXOL 350 MG/ML SOLN COMPARISON:  None Available. FINDINGS: CTA CHEST FINDINGS Cardiovascular: Satisfactory opacification of the pulmonary arteries to the segmental level. No evidence of pulmonary embolism. The main pulmonary artery is normal in caliber. Enlarged heart size. No significant pericardial effusion. The thoracic aorta aneurysm or dissection. Mild atherosclerotic plaque of the thoracic aorta. No coronary artery calcifications. Mediastinum/Nodes: No enlarged mediastinal, hilar, or axillary lymph nodes. Thyroid gland, trachea, and esophagus  demonstrate no significant findings. Lungs/Pleura: Biapical pleural/pulmonary scarring. Passive atelectasis the bilateral lower lobes. No focal consolidation. Couple right upper lobe pulmonary nodules measuring 7 and 8 mm. Right middle lobe subpleural 8 mm ground-glass nodule (5:60, 9:27). No pulmonary mass. Lingular atelectasis versus scarring. Small right and trace left pleural effusions. No pneumothorax. Musculoskeletal: No chest wall abnormality. Couple nonspecific sclerotic regions of the sternum (9:70, 73). No acute displaced fracture. Multilevel degenerative changes of the spine. Review of the MIP images confirms the above findings. CTA ABDOMEN AND PELVIS FINDINGS VASCULAR Aorta: Mild atherosclerotic plaque. Normal caliber aorta without aneurysm, dissection, vasculitis or significant stenosis. Celiac: Patent without evidence of aneurysm, dissection, vasculitis or significant stenosis. SMA: Patent without evidence of aneurysm, dissection, vasculitis or significant stenosis. Renals: Both renal arteries are patent without evidence of aneurysm, dissection, vasculitis, fibromuscular dysplasia or significant stenosis. IMA: Patent without evidence of aneurysm, dissection, vasculitis or significant stenosis. Inflow: Mild to moderate atherosclerotic plaque. Patent without evidence of aneurysm, dissection, vasculitis or significant stenosis. Veins: No obvious venous abnormality within the limitations of this arterial phase study. Review of the MIP images confirms the above findings. NON-VASCULAR Hepatobiliary: No focal liver abnormality. Status post cholecystectomy. No biliary dilatation. Pancreas: No focal lesion. Normal pancreatic contour. No surrounding inflammatory changes. No main pancreatic ductal dilatation. Spleen: Normal in size without focal abnormality. Adrenals/Urinary Tract: No adrenal nodule bilaterally. Bilateral kidneys enhance symmetrically. No hydronephrosis. No hydroureter. The urinary bladder is  unremarkable. Stomach/Bowel: Stomach is within normal limits. No evidence of bowel wall thickening or dilatation. Colonic diverticulosis. Appendix appears normal. Lymphatic: No lymphadenopathy. Reproductive: Posterior right lobulation of the uterus with query underlying fibroid. Otherwise uterus and bilateral adnexa are unremarkable. Other: No intraperitoneal free fluid. No intraperitoneal free gas. No organized fluid collection. Musculoskeletal: No abdominal wall hernia or abnormality. No suspicious lytic or blastic osseous lesions. No acute displaced fracture. Intervertebral disc space vacuum phenomenon at the L5-S1 level. Review of the MIP images confirms the above findings. IMPRESSION: 1. No acute thoracic abnormality. 2. No pulmonary embolus. 3. Couple right upper lobe pulmonary nodules measuring 7 and 8 mm. Right middle lobe subpleural 8 mm ground-glass nodule. Initial follow-up with CT at 6 months is recommended to confirm persistence. If persistent, repeat CT is recommended every 2 years until 5 years of stability has been established. This recommendation follows the consensus  statement: Guidelines for Management of Incidental Pulmonary Nodules Detected on CT Images: From the Fleischner Society 2017; Radiology 2017; 284:228-243. 4. Small right and trace left pleural effusions. 5. Cardiomegaly. 6. Couple nonspecific sclerotic regions of the sternum. Correlate with history of prior nondisplaced fractures versus history of metastatic malignancy. Recommend attention on follow-up. 7. Colonic diverticulosis with no acute diverticulitis. 8. Likely uterine fibroid. Electronically Signed   By: Iven Finn M.D.   On: 08/25/2021 18:56   DG Chest 2 View  Result Date: 08/25/2021 CLINICAL DATA:  Cough and dyspnea EXAM: CHEST - 2 VIEW COMPARISON:  None available FINDINGS: Low lung volumes. Borderline cardiomegaly though evaluation is limited by low lung volumes. Pulmonary vascular congestion. Hazy airspace and  interstitial opacities in the lower lungs. Small bilateral pleural effusions. No pneumothorax. Aortic calcification. IMPRESSION: Airspace and interstitial opacities in the lower lungs favored to represent pulmonary edema though infection is not excluded. Small bilateral pleural effusions. Aortic Atherosclerosis (ICD10-I70.0). Electronically Signed   By: Placido Sou M.D.   On: 08/25/2021 17:35    ECG & Cardiac Imaging    sinus tachycardia 110 bpm with right atrial enlargement and left bundle branch block, new since 2006 - personally reviewed.  Assessment & Plan    1.  Demand ischemia/abnormal ECG: Patient without prior cardiac history, presented to the emergency department with a 2-week history of cough followed by the development of right-sided chest discomfort, which is worse with coughing, deep breathing, and position changes.  In the emergency department, ECG showed sinus tachycardia with right atrial lodgment and left bundle branch block, which is new since 2006.  Troponin is mildly elevated with a somewhat flat trend of 15  22  33.  BNP elevated at 2082.6 and CTA chest negative for PE but did show small right and trace left pleural effusions.  She continues to have pleuritic type chest pain as well as chest wall tenderness.  Presentation is not consistent with ACS but is concerning for viral illness and potentially cardiomyopathy.  Echocardiogram has been ordered.  Pending LV function, will consider best modality for ischemic evaluation.  Continue aspirin.  Follow-up lipids.  2.  Acute respiratory failure/bronchitis: As above, 2-week history of productive cough with nasal/chest congestion and rhinorrhea.  Diagnosed with asthmatic bronchitis by her primary care provider in August 15 and was initially placed on prednisone and azithromycin.  Presented to the ED due to worsening of symptoms.  Antibiotics and steroids per primary team.  3.  Acute congestive heart failure: In addition to respiratory  issues outlined above, BNP was elevated at 2082.6 and chest x-ray/CT showed small bilateral pleural effusions.  She has been treated with intravenous Lasix.  She does not appear to be markedly volume overloaded on exam and we will hold any additional Lasix at this time.  Await 2D echocardiogram.  If LV function present, she will require right and left heart cardiac catheterization.  4.  Essential hypertension: Blood pressure currently stable.  Follow.  5.  Hyperlipidemia: On simvastatin at home.  Resume.  6.  Right-sided pulmonary nodules: Will require follow-up CT in 6 months in the outpatient setting.  7.  Acute on chronic back pain.  She has been using etodolac at home but has had worsening of back pain in the setting of recent respiratory illness.  Pain management and additional imaging per primary team.  Risk Assessment/Risk Scores:     TIMI Risk Score for Unstable Angina or Non-ST Elevation MI:   The patient's TIMI risk  score is 3, which indicates a 13% risk of all cause mortality, new or recurrent myocardial infarction or need for urgent revascularization in the next 14 days.  New York Heart Association (NYHA) Functional Class NYHA Class III    Signed, Murray Hodgkins, NP 08/26/2021, 11:19 AM  For questions or updates, please contact   Please consult [email or web address] for contact info under Cardiology/STEMI.

## 2021-08-26 NOTE — Assessment & Plan Note (Addendum)
Suspect chest pain is related to recent acute bronchitis however differential includes ischemic chest pain, viral myocarditis, as well as chest pain related to pulmonary nodules and sclerotic bone lesions of sternum seen on CT CTA aorta was negative for PE or acute thoracic abnormality - We will treat acute bronchitis as outlined  - We will continue to trend troponins to evaluate for ACS - Get sed rate, echo, BNP to further evaluate for myocarditis/cardiomyopathy based on findings of pleural effusion and cardiomegaly in the setting of recent respiratory tract infection -Pain control for right-sided atypical chest pain -Follow-up CT chest as outpatient which showed couple nonspecific sclerotic regions of the sternum. Correlate with history of prior nondisplaced fractures versus history ofmetastatic malignancy. Recommend attention on follow-up.

## 2021-08-26 NOTE — Progress Notes (Signed)
No charge progress note.   Glenda May is a 76 y.o. female with medical history significant for Hypertension and seasonal allergies who presents to the ED for evaluation of chest pain.   Patient has an history of recent upper respiratory infection for which she was being treated as an outpatient.  She was tachycardic and tachypneic on admission.  Troponin mildly positive but significantly elevated BNP above 2000.  Cardiology was consulted and patient had her echocardiogram today and preliminary results which were communicated by Dr. 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  They are recommending continuation of IV diuresis and right and left heart catheterization on Monday.  Patient continued to have right upper back pain, which seems more like musculoskeletal. On exam Glenda May is a fragile elderly lady, having few basal crackles and no lower extremity edema.  Son was at bedside. -We will continue IV diuresis and follow-up recommendations of cardiology for acute heart failure.

## 2021-08-26 NOTE — Assessment & Plan Note (Signed)
TSH was checked and was normal.  Was tachycardia to the 1 teens Suspect related to acute pain CTA was negative for PE Pain control

## 2021-08-26 NOTE — Assessment & Plan Note (Signed)
Chest x-ray showed airspace and interstitial opacities in the lower lungs favored to represent pulmonary edema though infection is not excluded. - We will treat with Z-Pak, DuoNeb as needed and oral prednisone

## 2021-08-26 NOTE — Assessment & Plan Note (Signed)
Was mildly hypoxic to the low 90s in the ED and required 2 L Wean as tolerated

## 2021-08-26 NOTE — ED Notes (Signed)
Pt started c/o cp that was severe, this RN administered morphine. A few mins later pt sts the pain was still severe. This RN administered nitro SL and 5 mins later pt stated the pain was much better. Hassan Rowan NP was in the unit and this RN notified her of what happened. Dr. Damita Dunnings was also notified of her BNP even though it wasn't critical by this RN.

## 2021-08-26 NOTE — Evaluation (Signed)
Occupational Therapy Evaluation Patient Details Name: Glenda May MRN: [id number] DOB: 1945/10/09 Today's Date: 08/26/2021   History of Present Illness presented to ER secondary to worsening SOB, chest and back pain; admitted for management of acute respiratory disease related to bronchitis.  Chest CT negative for PE; per cardiology consult, chest pain likely demand ischemia related to viral illness (no ACS)   Clinical Impression   Ms. Dangerfield presents with generalized weakness, reduced endurance, and pain. She lives with her son in a level-entry home, reports that prior to admission she has been active in her home and in the community, shopping/cleaning/cooking for herself and her son, driving, exercising regularly. She does not use AD and reports no falls history. During today's evaluation, she is received in a recliner, visiting with friends, and states that she does not have any pain at present, following recent administration of pain medications. She is able to transfer, perform UB and LB dressing, ambulate ~ 150' w/o AD and with no LOB, engage in toileting and standing grooming tasks, all with SUPV for safety but no physical assistance required. O2 sats remain at 95% or greater throughout session, on RA. Towards end of session, pt appears to tire, and, after returning to seated position, she reports 4/10 pain in R chest and upper and lower back. Ice applied. Provided educ re: activity modifications, energy conservation strategies, slow/gentle return to previous level of activities. Pt verbalizes understanding. Will continue to offer OT during hospitalizations, to assist with pain mgmt, ongoing educ, and safe performance of fxl mobility tasks. At this point do not anticipate that pt will required OT services post DC, but will assess during upcoming sessions and request HHOT if pt continues to have unresolved pain, fatigue, or demonstrates impaired ability to perform ADLs.   Recommendations for  follow up therapy are one component of a multi-disciplinary discharge planning process, led by the attending physician.  Recommendations may be updated based on patient status, additional functional criteria and insurance authorization.   Follow Up Recommendations  No OT follow up    Assistance Recommended at Discharge PRN  Patient can return home with the following      Functional Status Assessment  Patient has had a recent decline in their functional status and demonstrates the ability to make significant improvements in function in a reasonable and predictable amount of time.  Equipment Recommendations  None recommended by OT    Recommendations for Other Services       Precautions / Restrictions Precautions Precautions: Fall Precaution Comments: Mod fall risk Restrictions Weight Bearing Restrictions: No      Mobility Bed Mobility Overal bed mobility: Independent                  Transfers Overall transfer level: Needs assistance Equipment used: None Transfers: Sit to/from Stand Sit to Stand: Supervision           General transfer comment: SUPV for safety      Balance Overall balance assessment: Needs assistance Sitting-balance support: No upper extremity supported, Feet supported Sitting balance-Leahy Scale: Good     Standing balance support: During functional activity, No upper extremity supported Standing balance-Leahy Scale: Good                             ADL either performed or assessed with clinical judgement   ADL Overall ADL's : Needs assistance/impaired     Grooming: Wash/dry hands;Standing;Supervision/safety  Upper Body Dressing : Standing;Supervision/safety   Lower Body Dressing: Sit to/from stand;Supervision/safety   Toilet Transfer: Supervision/safety;Comfort height toilet;Ambulation Toilet Transfer Details (indicate cue type and reason): SUPV for safety Toileting- Clothing Manipulation and Hygiene:  Modified independent;Sitting/lateral lean         General ADL Comments: close SUPV for safety, no LOB noted     Vision         Perception     Praxis      Pertinent Vitals/Pain Pain Assessment Pain Assessment: 0-10 Pain Score: 4  Pain Location: R side of chest, R side of upper and lower back Pain Descriptors / Indicators: Grimacing, Guarding Pain Intervention(s): Ice applied, Repositioned, Limited activity within patient's tolerance, Monitored during session     Hand Dominance     Extremity/Trunk Assessment Upper Extremity Assessment Upper Extremity Assessment: Overall WFL for tasks assessed   Lower Extremity Assessment Lower Extremity Assessment: Overall WFL for tasks assessed   Cervical / Trunk Assessment Cervical / Trunk Assessment: Normal   Communication Communication Communication: No difficulties   Cognition Arousal/Alertness: Awake/alert Behavior During Therapy: WFL for tasks assessed/performed Overall Cognitive Status: Within Functional Limits for tasks assessed                                       General Comments       Exercises Other Exercises Other Exercises: Educ re: energy conservation strategies, rehab process, gradual return to previous level of activity, pain mgmt strategies   Shoulder Instructions      Home Living Family/patient expects to be discharged to:: Private residence Living Arrangements: Children Available Help at Discharge: Family;Available PRN/intermittently Type of Home: House Home Access: Level entry     Home Layout: Two level;Able to live on main level with bedroom/bathroom     Bathroom Shower/Tub: Teacher, early years/pre: Standard     Home Equipment: None          Prior Functioning/Environment Prior Level of Function : Independent/Modified Independent             Mobility Comments: Ambulates at home and in community w/o AD, no falls in previous 12 months ADLs Comments: IND  with BADL/IADLs, drives, exercises at [facility], cooks/cleans/shops for self and son        OT Problem List: Decreased activity tolerance;Pain;Decreased strength;Decreased range of motion      OT Treatment/Interventions: Self-care/ADL training;DME and/or AE instruction;Therapeutic activities;Balance training;Therapeutic exercise;Energy conservation;Patient/family education    OT Goals(Current goals can be found in the care plan section) Acute Rehab OT Goals Patient Stated Goal: to get back to regular exercise sessions at [facility] OT Goal Formulation: With patient Time For Goal Achievement: 09/09/21 Potential to Achieve Goals: Good ADL Goals Pt Will Perform Lower Body Dressing: with modified independence;sitting/lateral leans Pt Will Transfer to Toilet: Independently;regular height toilet Additional ADL Goal #1: Pt will identify/demonstrate 2+ energy conservation strategies  OT Frequency: Min 2X/week    Co-evaluation              AM-PAC OT "6 Clicks" Daily Activity     Outcome Measure Help from another person eating meals?: None Help from another person taking care of personal grooming?: None Help from another person toileting, which includes using toliet, bedpan, or urinal?: None Help from another person bathing (including washing, rinsing, drying)?: A Little Help from another person to put on and taking off regular upper body clothing?: None  Help from another person to put on and taking off regular lower body clothing?: None 6 Click Score: 23   End of Session    Activity Tolerance: Patient tolerated treatment well Patient left: in chair;with call bell/phone within reach;with family/visitor present  OT Visit Diagnosis: Pain;Muscle weakness (generalized) (M62.81)                Time: 7949-9718 OT Time Calculation (min): 21 min Charges:  OT General Charges $OT Visit: 1 Visit OT Evaluation $OT Eval Low Complexity: 1 Low OT Treatments $Self Care/Home Management : 8-22  mins Josiah Lobo, PhD, MS, OTR/L 08/26/21, 4:22 PM

## 2021-08-26 NOTE — Progress Notes (Signed)
*  PRELIMINARY RESULTS* Echocardiogram 2D Echocardiogram has been performed.  Glenda May 08/26/2021, 12:31 PM

## 2021-08-26 NOTE — Assessment & Plan Note (Signed)
Possible acute CHF given shortness of breath, BNP resulting postadmission over 2000, pleural effusions on chest x-ray Echocardiogram already ordered Daily weights with intake and output monitoring We will try Lasix IV and assess for symptomatic relief from shortness of breath, Cardiology consult

## 2021-08-26 NOTE — Progress Notes (Signed)
[employer] - nurse reports return of chest pain not relieved by morphine but completely relieved after one nitro.  BNP 2082.6 Bedside patient describes to me her chest pain started as right sided back pain that radiated to her chest and productive cough with wheezing several days ago.  Unable to describe phlegm or note if she had hemoptysis.  She does not have any lower extremity edema.  She haas been on long car trip one month ago.   Previous EKG ith LBBB, ST and prolonged Qtc  Action - repeat troponins lasix ordered per Dr Damita Dunnings EKG - unchanged Echo previously ordered   Response - remains chest pain free, trend troponins

## 2021-08-26 NOTE — Assessment & Plan Note (Signed)
Blood sugar was 246 with no history of diabetes We will get an A1c

## 2021-08-26 NOTE — Assessment & Plan Note (Addendum)
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 Initial follow-up with CT at 6 months is recommended to confirm persistence. If persistent, repeat CT is recommended every 2 years until 5 yearsof stability has been established

## 2021-08-26 NOTE — Assessment & Plan Note (Signed)
No evidence of bony disease on CTA

## 2021-08-27 DIAGNOSIS — Z9889 Other specified postprocedural states: Secondary | ICD-10-CM

## 2021-08-27 DIAGNOSIS — I420 Dilated cardiomyopathy: Secondary | ICD-10-CM

## 2021-08-27 DIAGNOSIS — R0789 Other chest pain: Secondary | ICD-10-CM

## 2021-08-27 DIAGNOSIS — I5021 Acute systolic (congestive) heart failure: Secondary | ICD-10-CM

## 2021-08-27 DIAGNOSIS — R778 Other specified abnormalities of plasma proteins: Secondary | ICD-10-CM | POA: Diagnosis not present

## 2021-08-27 DIAGNOSIS — R000 Tachycardia, unspecified: Secondary | ICD-10-CM | POA: Diagnosis not present

## 2021-08-27 DIAGNOSIS — R739 Hyperglycemia, unspecified: Secondary | ICD-10-CM

## 2021-08-27 DIAGNOSIS — I10 Essential (primary) hypertension: Secondary | ICD-10-CM

## 2021-08-27 DIAGNOSIS — J40 Bronchitis, not specified as acute or chronic: Secondary | ICD-10-CM | POA: Diagnosis not present

## 2021-08-27 LAB — CBC WITH DIFFERENTIAL/PLATELET
Abs Immature Granulocytes: 0.02 10*3/uL (ref 0.00–0.07)
Basophils Absolute: 0 10*3/uL (ref 0.0–0.1)
Basophils Relative: 0 %
Eosinophils Absolute: 0 10*3/uL (ref 0.0–0.5)
Eosinophils Relative: 0 %
HCT: 33.2 % — ABNORMAL LOW (ref 36.0–46.0)
Hemoglobin: 11.1 g/dL — ABNORMAL LOW (ref 12.0–15.0)
Immature Granulocytes: 0 %
Lymphocytes Relative: 21 %
Lymphs Abs: 1.8 10*3/uL (ref 0.7–4.0)
MCH: 30.4 pg (ref 26.0–34.0)
MCHC: 33.4 g/dL (ref 30.0–36.0)
MCV: 91 fL (ref 80.0–100.0)
Monocytes Absolute: 0.7 10*3/uL (ref 0.1–1.0)
Monocytes Relative: 8 %
Neutro Abs: 6.3 10*3/uL (ref 1.7–7.7)
Neutrophils Relative %: 71 %
Platelets: 205 10*3/uL (ref 150–400)
RBC: 3.65 MIL/uL — ABNORMAL LOW (ref 3.87–5.11)
RDW: 13.2 % (ref 11.5–15.5)
WBC: 8.8 10*3/uL (ref 4.0–10.5)
nRBC: 0 % (ref 0.0–0.2)

## 2021-08-27 LAB — COMPREHENSIVE METABOLIC PANEL
ALT: 31 U/L (ref 0–44)
AST: 26 U/L (ref 15–41)
Albumin: 3.7 g/dL (ref 3.5–5.0)
Alkaline Phosphatase: 63 U/L (ref 38–126)
Anion gap: 11 (ref 5–15)
BUN: 17 mg/dL (ref 8–23)
CO2: 26 mmol/L (ref 22–32)
Calcium: 8.7 mg/dL — ABNORMAL LOW (ref 8.9–10.3)
Chloride: 92 mmol/L — ABNORMAL LOW (ref 98–111)
Creatinine, Ser: 0.81 mg/dL (ref 0.44–1.00)
GFR, Estimated: >60 mL/min (ref >60)
Glucose, Bld: 129 mg/dL — ABNORMAL HIGH (ref 70–99)
Potassium: 3.3 mmol/L — ABNORMAL LOW (ref 3.5–5.1)
Sodium: 129 mmol/L — ABNORMAL LOW (ref 135–145)
Total Bilirubin: 0.4 mg/dL (ref 0.3–1.2)
Total Protein: 6.6 g/dL (ref 6.5–8.1)

## 2021-08-27 LAB — RESPIRATORY PANEL BY PCR

## 2021-08-27 LAB — PROCALCITONIN: Procalcitonin: <0.1 ng/mL

## 2021-08-27 LAB — PHOSPHORUS: Phosphorus: 3.4 mg/dL (ref 2.5–4.6)

## 2021-08-27 LAB — MAGNESIUM: Magnesium: 2 mg/dL (ref 1.7–2.4)

## 2021-08-27 MED ORDER — LOSARTAN POTASSIUM 25 MG PO TABS
12.5000 mg | ORAL_TABLET | Freq: Every day | ORAL | Status: DC
  Administered 2021-08-27 – 2021-08-29 (×3): 12.5 mg via ORAL
  Filled 2021-08-27 (×3): qty 1

## 2021-08-27 MED ORDER — POTASSIUM CHLORIDE CRYS ER 20 MEQ PO TBCR
40.0000 meq | EXTENDED_RELEASE_TABLET | Freq: Every day | ORAL | Status: DC
  Administered 2021-08-27 – 2021-08-28 (×2): 40 meq via ORAL
  Filled 2021-08-27 (×2): qty 2

## 2021-08-27 MED ORDER — SIMVASTATIN 20 MG PO TABS
40.0000 mg | ORAL_TABLET | Freq: Every day | ORAL | Status: DC
  Administered 2021-08-27 – 2021-08-28 (×2): 40 mg via ORAL
  Filled 2021-08-27 (×2): qty 2

## 2021-08-27 MED ORDER — DAPAGLIFLOZIN PROPANEDIOL 10 MG PO TABS
10.0000 mg | ORAL_TABLET | Freq: Every day | ORAL | Status: DC
  Administered 2021-08-27 – 2021-08-31 (×4): 10 mg via ORAL
  Filled 2021-08-27 (×5): qty 1

## 2021-08-27 MED ORDER — LORAZEPAM 0.5 MG PO TABS
0.5000 mg | ORAL_TABLET | Freq: Four times a day (QID) | ORAL | Status: DC | PRN
  Administered 2021-08-27: 1 mg via ORAL
  Filled 2021-08-27: qty 2

## 2021-08-27 MED ORDER — SODIUM CHLORIDE 0.9% FLUSH
3.0000 mL | Freq: Two times a day (BID) | INTRAVENOUS | Status: DC
  Administered 2021-08-27 – 2021-08-31 (×8): 3 mL via INTRAVENOUS

## 2021-08-27 NOTE — Consult Note (Addendum)
   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  Moderately dilated left ventricular cavity.  Mildly elevated pulmonary artery systolic pressures.  Moderate mitral regurgitation.  She presented to the emergency room with complaints of back and chest pain along with shortness of breath. BNP was 2,086.  Comorbidities:  Hyperlipidemia Hypertension  Medications:  Aspirin 81 mg Farxiga 10 mg daily Furosemide 40 mg IV every 12 Losartan 12.5 mg daily Potassium chloride 40 mEq daily Zocor 40 mg daily  Labs:  Sodium 129, potassium 3.3-week, chloride 92, CO2 26, BUN 17, creatinine 0.81, estimated GFR greater than 60. Weight not documented Intake not documented Output 500 mL Blood pressure 104/83  Initial meeting with patient and her son Glenda May who was at the bedside.  She also requested that her niece Glenda May be on the phone and listen to the session.  With discussing heart failure she states that the physicians have told her that her function of her heart is 20 to 25%, with normal at about 55%.  She says that the land on heart catheterization on Monday.  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  Over the importance of the lower sodium intake, removing the saltshaker from the table, discussed limiting fluid intake to no more than 64 ounces daily.  Went over all the things that constitute liquids.  So discussed daily weights, recording and what to report.  Discussed the outpatient heart failure clinic for which she has an appointment on September 5 at 4 PM.  She is in normal percent no-show ratio.  She was given the living with heart failure teaching booklet, zone magnet, follow on low-sodium and heart failure along with weight chart.  Had no further questions.  Glenda Riffle RN [facility]

## 2021-08-27 NOTE — Progress Notes (Signed)
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 BF PMHx HTN, and seasonal allergies   Presents to the ED for evaluation of chest pain.  Patient was being treated as an outpatient for possible bronchitis after complaining of sinus congestion, postnasal drip and typical allergy symptoms for the past 2 weeks.  Over the course of the last few days she developed right-sided chest pain of severe intensity radiating from the mid right back to the anterior chest, worse on coughing.  She states her allergy symptoms are improving but the chest pain is worsening.  She denies fever or chills or shortness of breath.  Denies nausea or vomiting.  Denies abdominal pain or change in bowel habits. ED course and data review: Tachycardic to 112, tachypneic 22-30 with normal blood pressures and oxygen saturation.  Labs showing troponin 15-22.  Normal TSH.  WBC normal, hemoglobin 11.5, sodium 131, glucose 246, AST 45.  Sodium 131, COVID and flu negative.  EKG, personally viewed and interpreted with sinus rhythm at 87, prolonged QT interval. CTA chest showed the following findings: IMPRESSION: 1. No acute thoracic abnormality. 2. No pulmonary embolus. 3. Couple right upper lobe pulmonary nodules measuring 7 and 8 mm. Right middle lobe subpleural 8 mm ground-glass nodule. Initial follow-up with CT at 6 months is recommended to confirm persistence. If persistent, repeat CT is recommended every 2 years until 5 years of stability has been established. This recommendation follows the consensus statement: Guidelines for Management of Incidental Pulmonary Nodules Detected on CT Images: From the Fleischner Society 2017; Radiology 2017; 284:228-243. 4. Small right and trace left pleural effusions. 5. Cardiomegaly. 6. Couple nonspecific sclerotic regions of the sternum. Correlate with history of prior  nondisplaced fractures versus history of metastatic malignancy. Recommend attention on follow-up. 7. Colonic diverticulosis with no acute diverticulitis. 8. Likely uterine fibroid.   Subjective: 8/18 afebrile overnight A/O x4, currently negative SOB, negative CP.   Assessment & Plan: Covid vaccination;   Principal Problem:   Chest pain Active Problems:   Elevated troponin   Acute bronchitis   Hypoxia   Sinus tachycardia   Acute CHF (congestive heart failure) (HCC)   Hyperglycemia   Multiple pulmonary nodules   Hypertension   History of lumbar surgery  Chest pain/ NSTEMI Suspect chest pain is related to recent acute bronchitis however differential includes ischemic chest pain, viral myocarditis, as well as chest pain related to pulmonary nodules and sclerotic bone lesions of sternum seen on CT CTA aorta was negative for PE or acute thoracic abnormality - We will treat acute bronchitis as outlined  - We will continue to trend troponins to evaluate for ACS - Get sed rate, echo, BNP to further evaluate for myocarditis/cardiomyopathy based on findings of pleural effusion and cardiomegaly in the setting of recent respiratory tract infection -Pain control for right-sided atypical chest pain -Follow-up CT chest as outpatient which showed couple nonspecific sclerotic regions of the sternum. Correlate with history of prior nondisplaced fractures versus history ofmetastatic malignancy. Recommend attention on follow-up.   Elevated troponin Troponin 15-22, nonspecific changes on EKG.  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 Possible acute CHF given shortness of breath, BNP resulting postadmission over 2000, pleural effusions on chest x-ray We will try Lasix IV and assess for symptomatic relief from shortness of breath, -Strict ins and out - Daily weight -8/18 per cardiology right and left heart catheterization on Monday  Sinus tachycardia -TSH was checked and was normal.  Multifactorial (anxiety, pain) -CTA was negative for PE  Essential HTN - Meds per cardiology    Hypoxia -Was mildly hypoxic to the low 90s in the ED and required 2 L -Wean as tolerated   Acute bronchitis Chest x-ray showed airspace and interstitial opacities in the lower lungs favored to represent pulmonary edema though infection is not excluded. - We will treat with Z-Pak, DuoNeb as needed and oral prednisone -8/18 do not believe secondary to bronchitis will obtain procalcitonin adjust antibiotics.  Multiple pulmonary nodules CTA showed: Couple right upper lobe pulmonary nodules measuring 7 and 8 mm.Right middle lobe subpleural 8 mm ground-glass nodule. Initial follow-up with CT at 6 months is recommended to confirm persistence. If persistent, repeat CT is recommended every 2 years until 5 yearsof stability has been established   Hyperglycemia -Blood sugar was 246 with no history of diabetes -8/18 A1c pending   History of lumbar surgery -No evidence of bony disease on CTA  Hypokalemia - Potassium goal> 4 - 8/18 K-Dur 50 mEq   Hyponatremia - Unable to add normal saline secondary to patient's acute CHF. - If sodium continues to drop will consider bolus 3% saline  Anxiety - Patient anxious given she has few risk factors for acute CHF, and now has EF of 25%. - Ativan 0.5 to 1 mg PRN      Mobility Assessment (last 72 hours)     Mobility Assessment     Row Name 08/26/21 2100 08/26/21 1553 08/26/21 1222       Does patient have an order for bedrest or is patient medically unstable No - Continue  assessment -- No - Continue assessment     What is the highest level of mobility based on the progressive mobility assessment? Level 6 (Walks independently in room and hall) - Balance while walking in room without assist - Complete Level 5 (Walks with assist in room/hall) - Balance while stepping forward/back and can walk in room with assist - Complete Level 5 (Walks with assist in room/hall) - Balance while stepping forward/back and can walk in room with assist - Complete                       DVT prophylaxis: Lovenox Code Status: Full Family Communication: 8/18 friend at bedside and niece on the phone discussed plan of care all questions answered Status is: Inpatient    Dispo: The patient is from: Home              Anticipated d/c is to: Home              Anticipated d/c date is: > 3 days              Patient currently is not medically stable to d/c.      Consultants:  Cardiology  Procedures/Significant Events:  8/18 Echocardiogram  Left Ventricle: Left ventricular ejection fraction, by estimation, is 20  to 25%. The left ventricle has severely decreased function. The left  ventricle demonstrates global hypokinesis. The average  left ventricular  global longitudinal strain is -5.1 %.  The left ventricular internal cavity size was moderately dilated. There is  no left ventricular hypertrophy. Left ventricular diastolic parameters are  consistent with Grade II diastolic dysfunction (pseudonormalization).   Right Ventricle: The right ventricular size is normal. No increase in  right ventricular wall thickness. Right ventricular systolic function is  normal. There is mildly elevated pulmonary artery systolic pressure. The  tricuspid regurgitant velocity is 2.85   m/s, and with an assumed right atrial pressure of 5 mmHg, the estimated  right ventricular systolic pressure is 64.4 mmHg.   Left Atrium: Left atrial size was normal in size.   Right Atrium: Right atrial  size was normal in size.   Pericardium: There is no evidence of pericardial effusion.   Mitral Valve: The mitral valve is normal in structure. Moderate mitral  valve regurgitation. No evidence of mitral valve stenosis.   Tricuspid Valve: The tricuspid valve is normal in structure. Tricuspid  valve regurgitation is not demonstrated. No evidence of tricuspid  stenosis.   Aortic Valve: The aortic valve is normal in structure. Aortic valve  regurgitation is moderate. Aortic regurgitation PHT measures 134 msec. No  aortic stenosis is present. Aortic valve mean gradient measures 2.0 mmHg.  Aortic valve peak gradient measures  3.8 mmHg. Aortic valve area, by VTI measures 1.89 cm.   Pulmonic Valve: The pulmonic valve was normal in structure. Pulmonic valve  regurgitation is not visualized. No evidence of pulmonic stenosis.   Aorta: The aortic root is normal in size and structure.   Venous: The inferior vena cava is normal in size with greater than 50%  respiratory variability, suggesting right atrial pressure of 3 mmHg.   IAS/Shunts: No atrial level shunt detected by color flow Doppler.   I have personally reviewed and interpreted all radiology studies and my findings are as above.  VENTILATOR SETTINGS:    Cultures   Antimicrobials: Anti-infectives (From admission, onward)    Start     Dose/Rate Route Frequency Ordered Stop   08/26/21 1000  azithromycin (ZITHROMAX) tablet 500 mg        500 mg Oral Daily 08/26/21 0112 08/31/21 0959         Devices    LINES / TUBES:      Continuous Infusions:   Objective: Vitals:   08/26/21 1800 08/26/21 1936 08/26/21 2339 08/27/21 0417  BP:  126/71 117/67 108/62  Pulse:  100 97 97  Resp: '17 19 19 19  '$ Temp:  97.7 F (36.5 C) 98.4 F (36.9 C) 98.9 F (37.2 C)  TempSrc:  Oral Oral Oral  SpO2:  96% 95% 97%  Weight:      Height:        Intake/Output Summary (Last 24 hours) at 08/27/2021 0756 Last data filed at 08/27/2021  0710 Gross per 24 hour  Intake 240 ml  Output 500 ml  Net -260 ml   Filed Weights   08/25/21 1617  Weight: 56.7 kg    Examination:  General: A/O x4 No acute respiratory distress Eyes: negative scleral hemorrhage, negative anisocoria, negative icterus ENT: Negative Runny nose, negative gingival bleeding, Neck:  Negative scars, masses, torticollis, lymphadenopathy, JVD Lungs: Clear to auscultation bilaterally without wheezes or crackles Cardiovascular: Regular rate and rhythm without murmur gallop or rub normal S1 and S2 Abdomen: negative abdominal pain, nondistended, positive soft, bowel sounds, no rebound, no ascites, no appreciable mass Extremities: No significant cyanosis, clubbing, or edema bilateral lower extremities Skin: Negative  rashes, lesions, ulcers Psychiatric:  Negative depression, positive anxiety, negative fatigue, negative mania  Central nervous system:  Cranial nerves II through XII intact, tongue/uvula midline, all extremities muscle strength 5/5, sensation intact throughout,  negative dysarthria, negative expressive aphasia, negative receptive aphasia  .     Data Reviewed: Care during the described time interval was provided by me .  I have reviewed this patient's available data, including medical history, events of note, physical examination, and all test results as part of my evaluation.  CBC: Recent Labs  Lab 08/25/21 1638  WBC 6.6  NEUTROABS 5.7  HGB 11.5*  HCT 34.9*  MCV 92.8  PLT 009   Basic Metabolic Panel: Recent Labs  Lab 08/25/21 1638 08/26/21 0336  NA 131* 132*  K 3.5 3.8  CL 98 99  CO2 21* 25  GLUCOSE 246* 184*  BUN 14 13  CREATININE 0.84 0.63  CALCIUM 9.3 8.9  MG  --  2.0   GFR: Estimated Creatinine Clearance: 46.6 mL/min (by C-G formula based on SCr of 0.63 mg/dL). Liver Function Tests: Recent Labs  Lab 08/25/21 1638  AST 45*  ALT 35  ALKPHOS 81  BILITOT 0.7  PROT 7.5  ALBUMIN 4.1   No results for input(s):  "LIPASE", "AMYLASE" in the last 168 hours. No results for input(s): "AMMONIA" in the last 168 hours. Coagulation Profile: No results for input(s): "INR", "PROTIME" in the last 168 hours. Cardiac Enzymes: No results for input(s): "CKTOTAL", "CKMB", "CKMBINDEX", "TROPONINI" in the last 168 hours. BNP (last 3 results) No results for input(s): "PROBNP" in the last 8760 hours. HbA1C: No results for input(s): "HGBA1C" in the last 72 hours. CBG: No results for input(s): "GLUCAP" in the last 168 hours. Lipid Profile: No results for input(s): "CHOL", "HDL", "LDLCALC", "TRIG", "CHOLHDL", "LDLDIRECT" in the last 72 hours. Thyroid Function Tests: Recent Labs    08/25/21 1638  TSH 1.052   Anemia Panel: No results for input(s): "VITAMINB12", "FOLATE", "FERRITIN", "TIBC", "IRON", "RETICCTPCT" in the last 72 hours. Sepsis Labs: No results for input(s): "PROCALCITON", "LATICACIDVEN" in the last 168 hours.  Recent Results (from the past 240 hour(s))  Resp Panel by RT-PCR (Flu A&B, Covid) Anterior Nasal Swab     Status: None   Collection Time: 08/25/21  4:38 PM   Specimen: Anterior Nasal Swab  Result Value Ref Range Status   SARS Coronavirus 2 by RT PCR NEGATIVE NEGATIVE Final    Comment: (NOTE) SARS-CoV-2 target nucleic acids are NOT DETECTED.  The SARS-CoV-2 RNA is generally detectable in upper respiratory specimens during the acute phase of infection. The lowest concentration of SARS-CoV-2 viral copies this assay can detect is 138 copies/mL. A negative result does not preclude SARS-Cov-2 infection and should not be used as the sole basis for treatment or other patient management decisions. A negative result may occur with  improper specimen collection/handling, submission of specimen other than nasopharyngeal swab, presence of viral mutation(s) within the areas targeted by this assay, and inadequate number of viral copies(<138 copies/mL). A negative result must be combined with clinical  observations, patient history, and epidemiological information. The expected result is Negative.  Fact Sheet for Patients:  EntrepreneurPulse.com.au  Fact Sheet for Healthcare Providers:  IncredibleEmployment.be  This test is no t yet approved or cleared by the [place] FDA and  has been authorized for detection and/or diagnosis of SARS-CoV-2 by FDA under an Emergency Use Authorization (EUA). This EUA will remain  in effect (meaning this test can be used) for the  duration of the COVID-19 declaration under Section 564(b)(1) of the Act, 21 U.S.C.section 360bbb-3(b)(1), unless the authorization is terminated  or revoked sooner.       Influenza A by PCR NEGATIVE NEGATIVE Final   Influenza B by PCR NEGATIVE NEGATIVE Final    Comment: (NOTE) The Xpert Xpress SARS-CoV-2/FLU/RSV plus assay is intended as an aid in the diagnosis of influenza from Nasopharyngeal swab specimens and should not be used as a sole basis for treatment. Nasal washings and aspirates are unacceptable for Xpert Xpress SARS-CoV-2/FLU/RSV testing.  Fact Sheet for Patients: EntrepreneurPulse.com.au  Fact Sheet for Healthcare Providers: IncredibleEmployment.be  This test is not yet approved or cleared by the [place] FDA and has been authorized for detection and/or diagnosis of SARS-CoV-2 by FDA under an Emergency Use Authorization (EUA). This EUA will remain in effect (meaning this test can be used) for the duration of the COVID-19 declaration under Section 564(b)(1) of the Act, 21 U.S.C. section 360bbb-3(b)(1), unless the authorization is terminated or revoked.  Performed at [facility], [address], [place], [place] [address]          Radiology Studies: ECHOCARDIOGRAM COMPLETE  Result Date: 08/26/2021    ECHOCARDIOGRAM REPORT   Patient Name:   Glenda May Date of Exam: 08/26/2021 Medical Rec #:   [id number]          Height:       62.0 in Accession #:    [id number]         Weight:       125.0 lb Date of Birth:  11/04/1945          BSA:          1.566 m Patient Age:    62 years           BP:           122/72 mmHg Patient Gender: F                  HR:           99 bpm. Exam Location:  [facility] Procedure: 2D Echo, Color Doppler, Cardiac Doppler and Strain Analysis Indications:     I50.31 congestive heart failure-Acute Diastolic  History:         Patient has no prior history of Echocardiogram examinations.                  Risk Factors:Hypertension and HCL.  Sonographer:     Charmayne Sheer Referring Phys:  [id number] Athena Masse Diagnosing Phys: Ida Rogue MD  Sonographer Comments: Global longitudinal strain was attempted. IMPRESSIONS  1. Left ventricular ejection fraction, by estimation, is 20 to 25%. The left ventricle has severely decreased function. The left ventricle demonstrates global hypokinesis. The left ventricular internal cavity size was moderately dilated. Left ventricular diastolic parameters are consistent with Grade II diastolic dysfunction (pseudonormalization). The average left ventricular global longitudinal strain is -5.1 %.  2. Right ventricular systolic function is normal. The right ventricular size is normal. There is mildly elevated pulmonary artery systolic pressure.  3. The mitral valve is normal in structure. Moderate mitral valve regurgitation. No evidence of mitral stenosis.  4. The aortic valve is normal in structure. Aortic valve regurgitation is moderate. No aortic stenosis is present.  5. The inferior vena cava is normal in size with greater than 50% respiratory variability, suggesting right atrial pressure of 3 mmHg. FINDINGS  Left Ventricle: Left ventricular ejection fraction, by estimation, is 20 to  25%. The left ventricle has severely decreased function. The left ventricle demonstrates global hypokinesis. The average left ventricular global longitudinal strain is -5.1 %. The left  ventricular internal cavity size was moderately dilated. There is no left ventricular hypertrophy. Left ventricular diastolic parameters are consistent with Grade II diastolic dysfunction (pseudonormalization). Right Ventricle: The right ventricular size is normal. No increase in right ventricular wall thickness. Right ventricular systolic function is normal. There is mildly elevated pulmonary artery systolic pressure. The tricuspid regurgitant velocity is 2.85  m/s, and with an assumed right atrial pressure of 5 mmHg, the estimated right ventricular systolic pressure is 60.1 mmHg. Left Atrium: Left atrial size was normal in size. Right Atrium: Right atrial size was normal in size. Pericardium: There is no evidence of pericardial effusion. Mitral Valve: The mitral valve is normal in structure. Moderate mitral valve regurgitation. No evidence of mitral valve stenosis. Tricuspid Valve: The tricuspid valve is normal in structure. Tricuspid valve regurgitation is not demonstrated. No evidence of tricuspid stenosis. Aortic Valve: The aortic valve is normal in structure. Aortic valve regurgitation is moderate. Aortic regurgitation PHT measures 134 msec. No aortic stenosis is present. Aortic valve mean gradient measures 2.0 mmHg. Aortic valve peak gradient measures 3.8 mmHg. Aortic valve area, by VTI measures 1.89 cm. Pulmonic Valve: The pulmonic valve was normal in structure. Pulmonic valve regurgitation is not visualized. No evidence of pulmonic stenosis. Aorta: The aortic root is normal in size and structure. Venous: The inferior vena cava is normal in size with greater than 50% respiratory variability, suggesting right atrial pressure of 3 mmHg. IAS/Shunts: No atrial level shunt detected by color flow Doppler.  LEFT VENTRICLE PLAX 2D LVIDd:         4.66 cm      Diastology LVIDs:         4.25 cm      LV e' medial:    2.39 cm/s LV PW:         1.19 cm      LV E/e' medial:  62.3 LV IVS:        0.63 cm      LV e' lateral:    2.61 cm/s LVOT diam:     1.80 cm      LV E/e' lateral: 57.1 LV SV:         28 LV SV Index:   18           2D Longitudinal Strain LVOT Area:     2.54 cm     2D Strain GLS Avg:     -5.1 %  LV Volumes (MOD) LV vol d, MOD A2C: 111.0 ml LV vol d, MOD A4C: 125.0 ml LV vol s, MOD A2C: 81.7 ml LV vol s, MOD A4C: 71.2 ml LV SV MOD A2C:     29.3 ml LV SV MOD A4C:     125.0 ml LV SV MOD BP:      42.3 ml RIGHT VENTRICLE RV Basal diam:  2.84 cm TAPSE (M-mode): 2.2 cm LEFT ATRIUM             Index        RIGHT ATRIUM           Index LA diam:        3.40 cm 2.17 cm/m   RA Area:     13.40 cm LA Vol (A2C):   54.3 ml 34.68 ml/m  RA Volume:   29.80 ml  19.03 ml/m LA Vol (A4C):  47.5 ml 30.34 ml/m LA Biplane Vol: 51.9 ml 33.15 ml/m  AORTIC VALVE                    PULMONIC VALVE AV Area (Vmax):    1.97 cm     PV Vmax:          0.78 m/s AV Area (Vmean):   2.07 cm     PV Peak grad:     2.5 mmHg AV Area (VTI):     1.89 cm     PR End Diast Vel: 15.52 msec AV Vmax:           97.30 cm/s AV Vmean:          62.700 cm/s AV VTI:            0.148 m AV Peak Grad:      3.8 mmHg AV Mean Grad:      2.0 mmHg LVOT Vmax:         75.30 cm/s LVOT Vmean:        51.100 cm/s LVOT VTI:          0.110 m LVOT/AV VTI ratio: 0.74 AI PHT:            134 msec  AORTA Ao Root diam: 2.90 cm MITRAL VALVE                TRICUSPID VALVE MV Area (PHT): 7.70 cm     TR Peak grad:   32.5 mmHg MV Decel Time: 99 msec      TR Vmax:        285.00 cm/s MR Peak grad: 89.9 mmHg MR Vmax:      474.00 cm/s   SHUNTS MV E velocity: 149.00 cm/s  Systemic VTI:  0.11 m                             Systemic Diam: 1.80 cm Ida Rogue MD Electronically signed by Ida Rogue MD Signature Date/Time: 08/26/2021/12:58:28 PM    Final    CT Angio Chest/Abd/Pel for Dissection W and/or Wo Contrast  Result Date: 08/25/2021 CLINICAL DATA:  Acute aortic syndrome (AAS) suspected EXAM: CT ANGIOGRAPHY CHEST, ABDOMEN AND PELVIS TECHNIQUE: Non-contrast CT of the chest was initially  obtained. Multidetector CT imaging through the chest, abdomen and pelvis was performed using the standard protocol during bolus administration of intravenous contrast. Multiplanar reconstructed images and MIPs were obtained and reviewed to evaluate the vascular anatomy. RADIATION DOSE REDUCTION: This exam was performed according to the departmental dose-optimization program which includes automated exposure control, adjustment of the mA and/or kV according to patient size and/or use of iterative reconstruction technique. CONTRAST:  5m OMNIPAQUE IOHEXOL 350 MG/ML SOLN COMPARISON:  None Available. FINDINGS: CTA CHEST FINDINGS Cardiovascular: Satisfactory opacification of the pulmonary arteries to the segmental level. No evidence of pulmonary embolism. The main pulmonary artery is normal in caliber. Enlarged heart size. No significant pericardial effusion. The thoracic aorta aneurysm or dissection. Mild atherosclerotic plaque of the thoracic aorta. No coronary artery calcifications. Mediastinum/Nodes: No enlarged mediastinal, hilar, or axillary lymph nodes. Thyroid gland, trachea, and esophagus demonstrate no significant findings. Lungs/Pleura: Biapical pleural/pulmonary scarring. Passive atelectasis the bilateral lower lobes. No focal consolidation. Couple right upper lobe pulmonary nodules measuring 7 and 8 mm. Right middle lobe subpleural 8 mm ground-glass nodule (5:60, 9:27). No pulmonary mass. Lingular atelectasis versus scarring. Small right and trace left pleural effusions. No pneumothorax. Musculoskeletal: No  chest wall abnormality. Couple nonspecific sclerotic regions of the sternum (9:70, 73). No acute displaced fracture. Multilevel degenerative changes of the spine. Review of the MIP images confirms the above findings. CTA ABDOMEN AND PELVIS FINDINGS VASCULAR Aorta: Mild atherosclerotic plaque. Normal caliber aorta without aneurysm, dissection, vasculitis or significant stenosis. Celiac: Patent without  evidence of aneurysm, dissection, vasculitis or significant stenosis. SMA: Patent without evidence of aneurysm, dissection, vasculitis or significant stenosis. Renals: Both renal arteries are patent without evidence of aneurysm, dissection, vasculitis, fibromuscular dysplasia or significant stenosis. IMA: Patent without evidence of aneurysm, dissection, vasculitis or significant stenosis. Inflow: Mild to moderate atherosclerotic plaque. Patent without evidence of aneurysm, dissection, vasculitis or significant stenosis. Veins: No obvious venous abnormality within the limitations of this arterial phase study. Review of the MIP images confirms the above findings. NON-VASCULAR Hepatobiliary: No focal liver abnormality. Status post cholecystectomy. No biliary dilatation. Pancreas: No focal lesion. Normal pancreatic contour. No surrounding inflammatory changes. No main pancreatic ductal dilatation. Spleen: Normal in size without focal abnormality. Adrenals/Urinary Tract: No adrenal nodule bilaterally. Bilateral kidneys enhance symmetrically. No hydronephrosis. No hydroureter. The urinary bladder is unremarkable. Stomach/Bowel: Stomach is within normal limits. No evidence of bowel wall thickening or dilatation. Colonic diverticulosis. Appendix appears normal. Lymphatic: No lymphadenopathy. Reproductive: Posterior right lobulation of the uterus with query underlying fibroid. Otherwise uterus and bilateral adnexa are unremarkable. Other: No intraperitoneal free fluid. No intraperitoneal free gas. No organized fluid collection. Musculoskeletal: No abdominal wall hernia or abnormality. No suspicious lytic or blastic osseous lesions. No acute displaced fracture. Intervertebral disc space vacuum phenomenon at the L5-S1 level. Review of the MIP images confirms the above findings. IMPRESSION: 1. No acute thoracic abnormality. 2. No pulmonary embolus. 3. Couple right upper lobe pulmonary nodules measuring 7 and 8 mm. Right middle  lobe subpleural 8 mm ground-glass nodule. Initial follow-up with CT at 6 months is recommended to confirm persistence. If persistent, repeat CT is recommended every 2 years until 5 years of stability has been established. This recommendation follows the consensus statement: Guidelines for Management of Incidental Pulmonary Nodules Detected on CT Images: From the Fleischner Society 2017; Radiology 2017; 284:228-243. 4. Small right and trace left pleural effusions. 5. Cardiomegaly. 6. Couple nonspecific sclerotic regions of the sternum. Correlate with history of prior nondisplaced fractures versus history of metastatic malignancy. Recommend attention on follow-up. 7. Colonic diverticulosis with no acute diverticulitis. 8. Likely uterine fibroid. Electronically Signed   By: Iven Finn M.D.   On: 08/25/2021 18:56   DG Chest 2 View  Result Date: 08/25/2021 CLINICAL DATA:  Cough and dyspnea EXAM: CHEST - 2 VIEW COMPARISON:  None available FINDINGS: Low lung volumes. Borderline cardiomegaly though evaluation is limited by low lung volumes. Pulmonary vascular congestion. Hazy airspace and interstitial opacities in the lower lungs. Small bilateral pleural effusions. No pneumothorax. Aortic calcification. IMPRESSION: Airspace and interstitial opacities in the lower lungs favored to represent pulmonary edema though infection is not excluded. Small bilateral pleural effusions. Aortic Atherosclerosis (ICD10-I70.0). Electronically Signed   By: Placido Sou M.D.   On: 08/25/2021 17:35        Scheduled Meds:  aspirin EC  81 mg Oral Daily   azithromycin  500 mg Oral Daily   enoxaparin (LOVENOX) injection  40 mg Subcutaneous Q24H   furosemide  40 mg Intravenous Q12H   predniSONE  40 mg Oral Q breakfast   Continuous Infusions:   LOS: 1 day    Time spent:40 min    Tattiana Fakhouri, Geraldo Docker, MD  [facility]   If 7PM-7AM, please contact night-coverage 08/27/2021, 7:56 AM

## 2021-08-27 NOTE — Progress Notes (Signed)
Occupational Therapy Treatment Patient Details Name: Glenda May MRN: [id number] DOB: [birth year] Today's Date: 08/27/2021   History of present illness presented to ER secondary to worsening SOB, chest and back pain; admitted for management of acute respiratory disease related to bronchitis.  Chest CT negative for PE; per cardiology consult, chest pain likely demand ischemia related to viral illness (no ACS)   OT comments  Glenda May endorses reduced pain and moves more fluidly today than during yesterday's OT session. She is able to perform bed mobility, transfers, ambulation, toileting, grooming, dressing, all with SUPV for safety, no AD, and no LOB. She does not c/o increased fatigue following OOB fxl mobility (which she experienced yesterday), and reported only a mild increase in pain with movement. Continued to provide health education re: rehab process, pacing, managing pain, and heart health. Pt endorses understanding and is able to provide teach-back. Will continue to follow pt during hospitalization, anticipating no further OT needs post DC.   Recommendations for follow up therapy are one component of a multi-disciplinary discharge planning process, led by the attending physician.  Recommendations may be updated based on patient status, additional functional criteria and insurance authorization.    Follow Up Recommendations  No OT follow up    Assistance Recommended at Discharge PRN  Patient can return home with the following      Equipment Recommendations  None recommended by OT    Recommendations for Other Services      Precautions / Restrictions Precautions Precautions: Fall Precaution Comments: Mod fall risk Restrictions Weight Bearing Restrictions: No       Mobility Bed Mobility Overal bed mobility: Independent                  Transfers Overall transfer level: Needs assistance Equipment used: None Transfers: Sit to/from Stand, Bed to  chair/wheelchair/BSC Sit to Stand: Supervision     Step pivot transfers: Modified independent (Device/Increase time)           Balance Overall balance assessment: Needs assistance Sitting-balance support: No upper extremity supported, Feet supported Sitting balance-Leahy Scale: Normal     Standing balance support: During functional activity, No upper extremity supported Standing balance-Leahy Scale: Good                             ADL either performed or assessed with clinical judgement   ADL Overall ADL's : Needs assistance/impaired Eating/Feeding: Independent   Grooming: Wash/dry hands;Standing;Supervision/safety;Wash/dry face               Lower Body Dressing: Sit to/from stand;Supervision/safety   Toilet Transfer: Supervision/safety;Ambulation;Regular Glass blower/designer Details (indicate cue type and reason): SUPV for safety Toileting- Clothing Manipulation and Hygiene: Modified independent;Sitting/lateral lean         General ADL Comments: close SUPV for safety, no LOB noted    Extremity/Trunk Assessment Upper Extremity Assessment Upper Extremity Assessment: Overall WFL for tasks assessed   Lower Extremity Assessment Lower Extremity Assessment: Overall WFL for tasks assessed        Vision       Perception     Praxis      Cognition Arousal/Alertness: Awake/alert Behavior During Therapy: WFL for tasks assessed/performed Overall Cognitive Status: Within Functional Limits for tasks assessed                                 General Comments: pleasant  and engaged        Exercises Other Exercises Other Exercises: Educ re: ECS, heart health, non-pharmacological pain mgmt strategies    Shoulder Instructions       General Comments      Pertinent Vitals/ Pain       Pain Assessment Pain Assessment: 0-10 Pain Score: 4  Pain Descriptors / Indicators: Aching, Grimacing Pain Intervention(s): Repositioned, Ice  applied, Utilized relaxation techniques  Home Living                                          Prior Functioning/Environment              Frequency  Min 2X/week        Progress Toward Goals  OT Goals(current goals can now be found in the care plan section)  Progress towards OT goals: Progressing toward goals  Acute Rehab OT Goals OT Goal Formulation: With patient Time For Goal Achievement: 09/09/21 Potential to Achieve Goals: Good  Plan Discharge plan remains appropriate;Frequency remains appropriate    Co-evaluation                 AM-PAC OT "6 Clicks" Daily Activity     Outcome Measure   Help from another person eating meals?: None Help from another person taking care of personal grooming?: None Help from another person toileting, which includes using toliet, bedpan, or urinal?: None Help from another person bathing (including washing, rinsing, drying)?: A Little Help from another person to put on and taking off regular upper body clothing?: None Help from another person to put on and taking off regular lower body clothing?: None 6 Click Score: 23    End of Session    OT Visit Diagnosis: Pain;Muscle weakness (generalized) (M62.81) Pain - Right/Left: Right   Activity Tolerance Patient tolerated treatment well   Patient Left in chair;with call bell/phone within reach;with family/visitor present   Nurse Communication          Time: 9892-1194 OT Time Calculation (min): 17 min  Charges: OT General Charges $OT Visit: 1 Visit OT Treatments $Self Care/Home Management : 8-22 mins  Josiah Lobo, PhD, MS, OTR/L 08/27/21, 3:51 PM

## 2021-08-27 NOTE — Discharge Instructions (Signed)
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
## 2021-08-27 NOTE — Progress Notes (Signed)
Cardiology Progress Note   Patient Name: Glenda May Date of Encounter: 08/27/2021  Primary Cardiologist: Ida Rogue, MD  Subjective   Becomes dyspneic w/ minimal activity in bed. Cont to have right sided chest/breast discomfort/tenderness.  Ongoing low back pain.    Inpatient Medications    Scheduled Meds:  aspirin EC  81 mg Oral Daily   azithromycin  500 mg Oral Daily   enoxaparin (LOVENOX) injection  40 mg Subcutaneous Q24H   furosemide  40 mg Intravenous Q12H   predniSONE  40 mg Oral Q breakfast   Continuous Infusions:  PRN Meds: acetaminophen, cyclobenzaprine, ipratropium-albuterol, ketorolac, morphine injection, morphine injection, nitroGLYCERIN, ondansetron (ZOFRAN) IV   Vital Signs    Vitals:   08/26/21 1936 08/26/21 2339 08/27/21 0417 08/27/21 0849  BP: 126/71 117/67 108/62 105/60  Pulse: 100 97 97 98  Resp: '19 19 19 16  '$ Temp: 97.7 F (36.5 C) 98.4 F (36.9 C) 98.9 F (37.2 C) 97.7 F (36.5 C)  TempSrc: Oral Oral Oral Oral  SpO2: 96% 95% 97% 100%  Weight:      Height:        Intake/Output Summary (Last 24 hours) at 08/27/2021 0934 Last data filed at 08/27/2021 0710 Gross per 24 hour  Intake 240 ml  Output 500 ml  Net -260 ml   Filed Weights   08/25/21 1617  Weight: 56.7 kg    Physical Exam   GEN: Well nourished, well developed, in no acute distress.  HEENT: Grossly normal.  Neck: Supple, no JVD, carotid bruits, or masses. Cardiac: RRR. No clubbing, cyanosis, edema.  Radials 2+, DP/PT 2+ and equal bilaterally.  Respiratory:  Respirations regular and unlabored, diminished breath sounds bilat. GI: Soft, nontender, nondistended, BS + x 4. MS: no deformity or atrophy. Skin: warm and dry, no rash. Neuro:  Strength and sensation are intact. Psych: AAOx3.  Normal affect.  Labs    Chemistry Recent Labs  Lab 08/25/21 1638 08/26/21 0336  NA 131* 132*  K 3.5 3.8  CL 98 99  CO2 21* 25  GLUCOSE 246* 184*  BUN 14 13  CREATININE  0.84 0.63  CALCIUM 9.3 8.9  PROT 7.5  --   ALBUMIN 4.1  --   AST 45*  --   ALT 35  --   ALKPHOS 81  --   BILITOT 0.7  --   GFRNONAA >60 >60  ANIONGAP 12 8     Hematology Recent Labs  Lab 08/25/21 1638 08/27/21 0833  WBC 6.6 8.8  RBC 3.76* 3.65*  HGB 11.5* 11.1*  HCT 34.9* 33.2*  MCV 92.8 91.0  MCH 30.6 30.4  MCHC 33.0 33.4  RDW 13.3 13.2  PLT 232 205    Cardiac Enzymes  Recent Labs  Lab 08/25/21 1638 08/25/21 1835 08/26/21 0336 08/26/21 1734  TROPONINIHS 15 22* 33* 31*      BNP    Component Value Date/Time   BNP 2,082.6 (H) 08/26/2021 0140    DDimer  Recent Labs  Lab 08/26/21 0336  DDIMER 0.69*     Radiology    CT Angio Chest/Abd/Pel for Dissection W and/or Wo Contrast  Result Date: 08/25/2021 CLINICAL DATA:  Acute aortic syndrome (AAS) suspected EXAM: CT ANGIOGRAPHY CHEST, ABDOMEN AND PELVIS TECHNIQUE: Non-contrast CT of the chest was initially obtained. Multidetector CT imaging through the chest, abdomen and pelvis was performed using the standard protocol during bolus administration of intravenous contrast. Multiplanar reconstructed images and MIPs were obtained and reviewed to evaluate the vascular  anatomy. RADIATION DOSE REDUCTION: This exam was performed according to the departmental dose-optimization program which includes automated exposure control, adjustment of the mA and/or kV according to patient size and/or use of iterative reconstruction technique. CONTRAST:  72m OMNIPAQUE IOHEXOL 350 MG/ML SOLN COMPARISON:  None Available. FINDINGS: CTA CHEST FINDINGS Cardiovascular: Satisfactory opacification of the pulmonary arteries to the segmental level. No evidence of pulmonary embolism. The main pulmonary artery is normal in caliber. Enlarged heart size. No significant pericardial effusion. The thoracic aorta aneurysm or dissection. Mild atherosclerotic plaque of the thoracic aorta. No coronary artery calcifications. Mediastinum/Nodes: No enlarged  mediastinal, hilar, or axillary lymph nodes. Thyroid gland, trachea, and esophagus demonstrate no significant findings. Lungs/Pleura: Biapical pleural/pulmonary scarring. Passive atelectasis the bilateral lower lobes. No focal consolidation. Couple right upper lobe pulmonary nodules measuring 7 and 8 mm. Right middle lobe subpleural 8 mm ground-glass nodule (5:60, 9:27). No pulmonary mass. Lingular atelectasis versus scarring. Small right and trace left pleural effusions. No pneumothorax. Musculoskeletal: No chest wall abnormality. Couple nonspecific sclerotic regions of the sternum (9:70, 73). No acute displaced fracture. Multilevel degenerative changes of the spine. Review of the MIP images confirms the above findings. CTA ABDOMEN AND PELVIS FINDINGS VASCULAR Aorta: Mild atherosclerotic plaque. Normal caliber aorta without aneurysm, dissection, vasculitis or significant stenosis. Celiac: Patent without evidence of aneurysm, dissection, vasculitis or significant stenosis. SMA: Patent without evidence of aneurysm, dissection, vasculitis or significant stenosis. Renals: Both renal arteries are patent without evidence of aneurysm, dissection, vasculitis, fibromuscular dysplasia or significant stenosis. IMA: Patent without evidence of aneurysm, dissection, vasculitis or significant stenosis. Inflow: Mild to moderate atherosclerotic plaque. Patent without evidence of aneurysm, dissection, vasculitis or significant stenosis. Veins: No obvious venous abnormality within the limitations of this arterial phase study. Review of the MIP images confirms the above findings. NON-VASCULAR Hepatobiliary: No focal liver abnormality. Status post cholecystectomy. No biliary dilatation. Pancreas: No focal lesion. Normal pancreatic contour. No surrounding inflammatory changes. No main pancreatic ductal dilatation. Spleen: Normal in size without focal abnormality. Adrenals/Urinary Tract: No adrenal nodule bilaterally. Bilateral kidneys  enhance symmetrically. No hydronephrosis. No hydroureter. The urinary bladder is unremarkable. Stomach/Bowel: Stomach is within normal limits. No evidence of bowel wall thickening or dilatation. Colonic diverticulosis. Appendix appears normal. Lymphatic: No lymphadenopathy. Reproductive: Posterior right lobulation of the uterus with query underlying fibroid. Otherwise uterus and bilateral adnexa are unremarkable. Other: No intraperitoneal free fluid. No intraperitoneal free gas. No organized fluid collection. Musculoskeletal: No abdominal wall hernia or abnormality. No suspicious lytic or blastic osseous lesions. No acute displaced fracture. Intervertebral disc space vacuum phenomenon at the L5-S1 level. Review of the MIP images confirms the above findings. IMPRESSION: 1. No acute thoracic abnormality. 2. No pulmonary embolus. 3. Couple right upper lobe pulmonary nodules measuring 7 and 8 mm. Right middle lobe subpleural 8 mm ground-glass nodule. Initial follow-up with CT at 6 months is recommended to confirm persistence. If persistent, repeat CT is recommended every 2 years until 5 years of stability has been established. This recommendation follows the consensus statement: Guidelines for Management of Incidental Pulmonary Nodules Detected on CT Images: From the Fleischner Society 2017; Radiology 2017; 284:228-243. 4. Small right and trace left pleural effusions. 5. Cardiomegaly. 6. Couple nonspecific sclerotic regions of the sternum. Correlate with history of prior nondisplaced fractures versus history of metastatic malignancy. Recommend attention on follow-up. 7. Colonic diverticulosis with no acute diverticulitis. 8. Likely uterine fibroid. Electronically Signed   By: MIven FinnM.D.   On: 08/25/2021 18:56   DG Chest  2 View  Result Date: 08/25/2021 CLINICAL DATA:  Cough and dyspnea EXAM: CHEST - 2 VIEW COMPARISON:  None available FINDINGS: Low lung volumes. Borderline cardiomegaly though evaluation is  limited by low lung volumes. Pulmonary vascular congestion. Hazy airspace and interstitial opacities in the lower lungs. Small bilateral pleural effusions. No pneumothorax. Aortic calcification. IMPRESSION: Airspace and interstitial opacities in the lower lungs favored to represent pulmonary edema though infection is not excluded. Small bilateral pleural effusions. Aortic Atherosclerosis (ICD10-I70.0). Electronically Signed   By: Placido Sou M.D.   On: 08/25/2021 17:35    Telemetry    RSR to sinus tachycardia, 90'a to low 100's - Personally Reviewed  Cardiac Studies   2D Echocardiogram 8.17.2023  1. Left ventricular ejection fraction, by estimation, is 20 to 25%. The  left ventricle has severely decreased function. The left ventricle  demonstrates global hypokinesis. The left ventricular internal cavity size  was moderately dilated. Left  ventricular diastolic parameters are consistent with Grade II diastolic  dysfunction (pseudonormalization). The average left ventricular global  longitudinal strain is -5.1 %.   2. Right ventricular systolic function is normal. The right ventricular  size is normal. There is mildly elevated pulmonary artery systolic  pressure.   3. The mitral valve is normal in structure. Moderate mitral valve  regurgitation. No evidence of mitral stenosis.   4. The aortic valve is normal in structure. Aortic valve regurgitation is  moderate. No aortic stenosis is present.   5. The inferior vena cava is normal in size with greater than 50%  respiratory variability, suggesting right atrial pressure of 3 mmHg.   Patient Profile     76 y.o. female with a history of allergic rhinits, HTN, and HL who presetnted 8/16 w/ a 2 wk h/o cough, chest/sinus congestion, back pain, pleuritic chest pain/chest wall tenderness, and progressive dyspnea, who was found to have CHF w/ EF of 20-25%.  Assessment & Plan    1. Acute systolic CHF/Cardiomyopathy:  Pt presented 8/16 w/ a 2  wk h/o progressive dyspnea, cough, and congestion.  BNP 2082.6 on admission and CT chest w/ small bilat effusions.  Echo showed LV dysfxn w/ EF of 20-25%, GrII diast dysfxn, nl RV fxn, and mod MR/AI.  She has been receiving IV lasix w/o significant improvement in breathing up to this point.  I/O inaccurate (no output recorded 8/16 or 17.  Minus 500 ml so far this AM.  BUN/creat stable.  Pressures low 100's to 120's.  Cont IV lasix.  Will add low-dose losartan 12.5 daily and farxiga '10mg'$  daily.  Pending BP response, will look to add ? blockern and potentially MRA over the weekend.  Plan R & L heart cath on Monday (unable to lie flat at this time).  The patient understands that risks include but are not limited to stroke (1 in 1000), death (1 in 80), kidney failure [usually temporary] (1 in 500), bleeding (1 in 200), allergic reaction [possibly serious] (1 in 200), and agrees to proceed.    2.  Acute resp failure/bronchitis:  w/ new CHF as above.  Has had wheezing over the past two wks.  Abx/nebs/steroids per IM.  3.  Demand Ischemia:  In setting of above, pt has been having pleuritic chest pain and chest wall tenderness  HsTrop minimally elevated w/ flat trend (15  22  33  31).  Chest pain is easily reproducible w/ palpation and not consistent w/ angina.  Suspect demand ischemia in the setting of CHF/cardiomyopathy.  As above,  plan cath on Monday.  Cont asa.  Will add statin and f/u lipids.  4.  Essential HTN:  stable.  Follow w/ diuresis and addition of GDMT.  5.  HL:  On simvastatin @ home.  Resume.  6.  R sided pulm nodules:  Will require f/u CT in six months.  7.  Acute on chronic back pain:  pain mgmt per IM.  Having trouble getting comfortable.  8.  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  R & L heart cath as planned.  Signed, Murray Hodgkins, NP  08/27/2021, 9:34 AM    For questions or updates, please contact   Please consult [email or web address] for contact info under Cardiology/STEMI.

## 2021-08-27 NOTE — Plan of Care (Signed)

## 2021-08-28 DIAGNOSIS — I420 Dilated cardiomyopathy: Secondary | ICD-10-CM | POA: Diagnosis not present

## 2021-08-28 DIAGNOSIS — R0789 Other chest pain: Secondary | ICD-10-CM | POA: Diagnosis not present

## 2021-08-28 DIAGNOSIS — I5021 Acute systolic (congestive) heart failure: Secondary | ICD-10-CM | POA: Diagnosis not present

## 2021-08-28 DIAGNOSIS — R000 Tachycardia, unspecified: Secondary | ICD-10-CM | POA: Diagnosis not present

## 2021-08-28 DIAGNOSIS — J40 Bronchitis, not specified as acute or chronic: Secondary | ICD-10-CM | POA: Diagnosis not present

## 2021-08-28 DIAGNOSIS — R7303 Prediabetes: Secondary | ICD-10-CM | POA: Diagnosis present

## 2021-08-28 LAB — LIPID PANEL
Cholesterol: 183 mg/dL (ref 0–200)
HDL: 56 mg/dL (ref >40)
LDL Cholesterol: 97 mg/dL (ref 0–99)
Total CHOL/HDL Ratio: 3.3 RATIO
Triglycerides: 151 mg/dL — ABNORMAL HIGH (ref <150)
VLDL: 30 mg/dL (ref 0–40)

## 2021-08-28 LAB — CBC WITH DIFFERENTIAL/PLATELET
Abs Immature Granulocytes: 0.02 10*3/uL (ref 0.00–0.07)
Basophils Absolute: 0 10*3/uL (ref 0.0–0.1)
Basophils Relative: 0 %
Eosinophils Absolute: 0 10*3/uL (ref 0.0–0.5)
Eosinophils Relative: 0 %
HCT: 34.1 % — ABNORMAL LOW (ref 36.0–46.0)
Hemoglobin: 11.6 g/dL — ABNORMAL LOW (ref 12.0–15.0)
Immature Granulocytes: 0 %
Lymphocytes Relative: 34 %
Lymphs Abs: 2.5 10*3/uL (ref 0.7–4.0)
MCH: 30.2 pg (ref 26.0–34.0)
MCHC: 34 g/dL (ref 30.0–36.0)
MCV: 88.8 fL (ref 80.0–100.0)
Monocytes Absolute: 0.8 10*3/uL (ref 0.1–1.0)
Monocytes Relative: 11 %
Neutro Abs: 4 10*3/uL (ref 1.7–7.7)
Neutrophils Relative %: 55 %
Platelets: 219 10*3/uL (ref 150–400)
RBC: 3.84 MIL/uL — ABNORMAL LOW (ref 3.87–5.11)
RDW: 13.1 % (ref 11.5–15.5)
WBC: 7.4 10*3/uL (ref 4.0–10.5)
nRBC: 0 % (ref 0.0–0.2)

## 2021-08-28 LAB — COMPREHENSIVE METABOLIC PANEL
ALT: 33 U/L (ref 0–44)
AST: 26 U/L (ref 15–41)
Albumin: 3.7 g/dL (ref 3.5–5.0)
Alkaline Phosphatase: 60 U/L (ref 38–126)
Anion gap: 7 (ref 5–15)
BUN: 17 mg/dL (ref 8–23)
CO2: 28 mmol/L (ref 22–32)
Calcium: 8.6 mg/dL — ABNORMAL LOW (ref 8.9–10.3)
Chloride: 99 mmol/L (ref 98–111)
Creatinine, Ser: 0.74 mg/dL (ref 0.44–1.00)
GFR, Estimated: >60 mL/min (ref >60)
Glucose, Bld: 95 mg/dL (ref 70–99)
Potassium: 3.3 mmol/L — ABNORMAL LOW (ref 3.5–5.1)
Sodium: 134 mmol/L — ABNORMAL LOW (ref 135–145)
Total Bilirubin: 0.4 mg/dL (ref 0.3–1.2)
Total Protein: 6.5 g/dL (ref 6.5–8.1)

## 2021-08-28 LAB — PHOSPHORUS: Phosphorus: 3.3 mg/dL (ref 2.5–4.6)

## 2021-08-28 LAB — POTASSIUM: Potassium: 5 mmol/L (ref 3.5–5.1)

## 2021-08-28 LAB — TROPONIN I (HIGH SENSITIVITY)
Troponin I (High Sensitivity): 20 ng/L — ABNORMAL HIGH (ref <18)
Troponin I (High Sensitivity): 19 ng/L — ABNORMAL HIGH (ref <18)
Troponin I (High Sensitivity): 19 ng/L — ABNORMAL HIGH (ref <18)

## 2021-08-28 LAB — PROCALCITONIN: Procalcitonin: <0.1 ng/mL

## 2021-08-28 LAB — LIPOPROTEIN A (LPA): Lipoprotein (a): 83.2 nmol/L — ABNORMAL HIGH (ref <75.0)

## 2021-08-28 LAB — MAGNESIUM
Magnesium: 2.2 mg/dL (ref 1.7–2.4)
Magnesium: 2.4 mg/dL (ref 1.7–2.4)

## 2021-08-28 LAB — HEMOGLOBIN A1C
Hgb A1c MFr Bld: 5.9 % — ABNORMAL HIGH (ref 4.8–5.6)
Mean Plasma Glucose: 122.63 mg/dL

## 2021-08-28 MED ORDER — POTASSIUM CHLORIDE CRYS ER 10 MEQ PO TBCR
10.0000 meq | EXTENDED_RELEASE_TABLET | Freq: Every day | ORAL | Status: DC
  Administered 2021-08-29: 10 meq via ORAL
  Filled 2021-08-28: qty 1

## 2021-08-28 MED ORDER — MORPHINE SULFATE (PF) 2 MG/ML IV SOLN: 2.0000 mg | INTRAVENOUS | Status: DC | PRN

## 2021-08-28 MED ORDER — POTASSIUM CHLORIDE CRYS ER 20 MEQ PO TBCR
40.0000 meq | EXTENDED_RELEASE_TABLET | Freq: Once | ORAL | Status: AC
  Administered 2021-08-28: 40 meq via ORAL
  Filled 2021-08-28: qty 2

## 2021-08-28 MED ORDER — POTASSIUM CHLORIDE CRYS ER 20 MEQ PO TBCR
50.0000 meq | EXTENDED_RELEASE_TABLET | Freq: Once | ORAL | Status: AC
Start: 2021-08-28 — End: 2021-08-28
  Administered 2021-08-28: 50 meq via ORAL
  Filled 2021-08-28: qty 1

## 2021-08-28 MED ORDER — POTASSIUM CHLORIDE 10 MEQ/100ML IV SOLN
10.0000 meq | INTRAVENOUS | Status: DC
  Filled 2021-08-28 (×4): qty 100

## 2021-08-28 MED ORDER — NITROGLYCERIN 0.4 MG SL SUBL
0.4000 mg | SUBLINGUAL_TABLET | SUBLINGUAL | Status: DC | PRN
  Administered 2021-08-28 (×5): 0.4 mg via SUBLINGUAL
  Filled 2021-08-28 (×3): qty 1

## 2021-08-28 MED ORDER — METOPROLOL SUCCINATE ER 25 MG PO TB24
12.5000 mg | ORAL_TABLET | Freq: Every day | ORAL | Status: DC
  Administered 2021-08-28 – 2021-08-31 (×3): 12.5 mg via ORAL
  Filled 2021-08-28 (×3): qty 1

## 2021-08-28 NOTE — TOC Initial Note (Signed)
Transition of Care [facility]) - Initial/Assessment Note    Patient Details  Name: Glenda May MRN: [id number] Date of Birth: July 25, 1945  Transition of Care [facility]) CM/SW Contact:    Harriet Masson, RN Phone Number:[phone] 08/28/2021, 4:07 PM  Clinical Narrative:                 Recommendations requested for HHPT however pt has opted to declined and no DME recommended at this time. Pt states her son lives in the basement of her home and will be able to assist with transportation and obtaining her medications from the local [place]. Pt denies any DME at this time. No other inquires or needs presented at this time.  TOC will continue to assist with any additional needs.   Expected Discharge Plan: Home/Self Care Barriers to Discharge: Continued Medical Work up   Patient Goals and CMS Choice     Choice offered to / list presented to : Patient  Expected Discharge Plan and Services Expected Discharge Plan: Home/Self Care   Discharge Planning Services: CM Consult   Living arrangements for the past 2 months: Single Family Home                                      Prior Living Arrangements/Services Living arrangements for the past 2 months: Single Family Home Lives with:: Adult Children Patient language and need for interpreter reviewed:: Yes Do you feel safe going back to the place where you live?: Yes      Need for Family Participation in Patient Care: Yes (Comment) Care giver support system in place?: Yes (comment)   Criminal Activity/Legal Involvement Pertinent to Current Situation/Hospitalization: No - Comment as needed  Activities of Daily Living Home Assistive Devices/Equipment: Eyeglasses ADL Screening (condition at time of admission) Patient's cognitive ability adequate to safely complete daily activities?: Yes Is the patient deaf or have difficulty hearing?: No Does the patient have difficulty seeing, even when wearing glasses/contacts?: No Does  the patient have difficulty concentrating, remembering, or making decisions?: No Patient able to express need for assistance with ADLs?: Yes Does the patient have difficulty dressing or bathing?: No Independently performs ADLs?: Yes (appropriate for developmental age) Does the patient have difficulty walking or climbing stairs?: Yes Weakness of Legs: Both Weakness of Arms/Hands: None  Permission Sought/Granted   Permission granted to share information with : Yes, Verbal Permission Granted              Emotional Assessment Appearance:: Appears stated age Attitude/Demeanor/Rapport: Engaged Affect (typically observed): Accepting Orientation: : Oriented to Self, Oriented to Place, Oriented to  Time, Oriented to Situation   Psych Involvement: No (comment)  Admission diagnosis:  Bronchitis [J40] Atypical chest pain [R07.89] Chest pain [R07.9] Patient Active Problem List   Diagnosis Date Noted   Dilated cardiomyopathy ([place]) 28/41/3244   Acute systolic CHF (congestive heart failure) ([place]) 08/27/2021   Sinus tachycardia 08/26/2021   History of lumbar surgery 08/26/2021   Acute CHF (congestive heart failure) ([place]) 08/26/2021   Multiple pulmonary nodules 08/25/2021   Chest pain 08/25/2021   Hypertension    Acute bronchitis    Hypoxia    Elevated troponin    Hyperglycemia    Benign essential hypertension 12/17/2015   PCP:  [name], MD Pharmacy:   [facility] [address] ([address]), [place] ([place]) [place] [address]  Phone: [phone] Fax: [phone]     Social Determinants of Health (SDOH) Interventions    Readmission Risk Interventions     No data to display

## 2021-08-28 NOTE — Progress Notes (Signed)
Attempted to speak with patient regarding recommendations for HHPT, patient resting.  Will have TOC team follow up.  Valente David, RN, MSN, CCM

## 2021-08-28 NOTE — Plan of Care (Signed)

## 2021-08-28 NOTE — Progress Notes (Signed)
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  Patient was being treated as an outpatient for possible bronchitis after complaining of sinus congestion, postnasal drip and typical allergy symptoms for the past 2 weeks.  Over the course of the last few days she developed right-sided chest pain of severe intensity radiating from the mid right back to the anterior chest, worse on coughing.  She states her allergy symptoms are improving but the chest pain is worsening.  She denies fever or chills or shortness of breath.  Denies nausea or vomiting.  Denies abdominal pain or change in bowel habits. ED course and data review: Tachycardic to 112, tachypneic 22-30 with normal blood pressures and oxygen saturation.  Labs showing troponin 15-22.  Normal TSH.  WBC normal, hemoglobin 11.5, sodium 131, glucose 246, AST 45.  Sodium 131, COVID and flu negative.  EKG, personally viewed and interpreted with sinus rhythm at 87, prolonged QT interval. CTA chest showed the following findings: IMPRESSION: 1. No acute thoracic abnormality. 2. No pulmonary embolus. 3. Couple right upper lobe pulmonary nodules measuring 7 and 8 mm. Right middle lobe subpleural 8 mm ground-glass nodule. Initial follow-up with CT at 6 months is recommended to confirm persistence. If persistent, repeat CT is recommended every 2 years until 5 years of stability has been established. This recommendation follows the consensus statement: Guidelines for Management of Incidental Pulmonary Nodules Detected on CT Images: From the Fleischner Society 2017; Radiology 2017; 284:228-243. 4. Small right and trace left pleural effusions. 5. Cardiomegaly. 6. Couple nonspecific sclerotic regions of the sternum. Correlate with history of prior  nondisplaced fractures versus history of metastatic malignancy. Recommend attention on follow-up. 7. Colonic diverticulosis with no acute diverticulitis. 8. Likely uterine fibroid.   Subjective: 8/19 afebrile overnight.  Positive CP, positive SOB.  Began when patient ambulated from bed to chair to eat breakfast~1030 this morning.  Rates pain 9/10, sharp under her right breast radiating to back.  Negative diaphoresis    Assessment & Plan: Covid vaccination;   Principal Problem:   Acute systolic CHF (congestive heart failure) (HCC) Active Problems:   Chest pain   Elevated troponin   Acute bronchitis   Hypoxia   Sinus tachycardia   Acute CHF (congestive heart failure) (HCC)   Hyperglycemia   Multiple pulmonary nodules   Hypertension   History of lumbar surgery   Dilated cardiomyopathy (HCC)  Chest pain/ NSTEMI Suspect chest pain is related to recent acute bronchitis however differential includes ischemic chest pain, viral myocarditis, as well as chest pain related to pulmonary nodules and sclerotic bone lesions of sternum seen on CT CTA aorta was negative for PE or acute thoracic abnormality - We will treat acute bronchitis as outlined  - We will continue to trend troponins to evaluate for ACS - Get sed rate, echo, BNP to further evaluate for myocarditis/cardiomyopathy based on findings of pleural effusion and cardiomegaly in the setting of recent respiratory tract infection -Pain control for right-sided atypical chest pain -Follow-up CT chest as outpatient which showed couple nonspecific sclerotic regions of the sternum. Correlate with history of prior nondisplaced fractures versus history ofmetastatic malignancy. Recommend attention on follow-up. -8/19 new onset chest pain with exertion.  Obtain EKG, trend troponin, NTG 0.4 mg SL x3,  placed on 2 L O2.  If no resolution of CP and SOB morphine 2 mg   Elevated troponin Troponin 15-22, nonspecific changes on EKG.  Chest x-ray  showed trace left pleural effusions and cardiomegaly  Latest Reference Range & Units 08/25/21 16:38 08/25/21 18:35 08/26/21 03:36 08/26/21 17:34  Troponin I (High Sensitivity) <18 ng/L 15 22 (H) 33 (H) 31 (H)  (H): Data is abnormally high - Empiric NSAID - Echocardiogram: Consistent with acute systolic and diastolic CHF see results below   Acute Systolic and Diastolic CHF (HCC)/ischemic cardiomyopathy? Possible acute CHF given shortness of breath, BNP resulting postadmission over 2000, pleural effusions on chest x-ray We will try Lasix IV and assess for symptomatic relief from shortness of breath, -Strict ins and out -2.4 L - Daily weight Filed Weights   08/25/21 1617  Weight: 56.7 kg    -8/18 per cardiology right and left heart catheterization on Monday  Sinus tachycardia -TSH was checked and was normal.  Multifactorial (anxiety, pain) -CTA was negative for PE  Essential HTN - Meds per cardiology    Hypoxia -Was mildly hypoxic to the low 90s in the ED and required 2 L -Wean as tolerated   Acute bronchitis Chest x-ray showed airspace and interstitial opacities in the lower lungs favored to represent pulmonary edema though infection is not excluded. - We will treat with Z-Pak, DuoNeb as needed and oral prednisone -8/18 do not believe secondary to bronchitis will obtain procalcitonin adjust antibiotics. -8/19 procalcitonin<0.10, which indicates no need for antibiotics and steroids will discontinue.  Multiple pulmonary nodules CTA showed: Couple right upper lobe pulmonary nodules measuring 7 and 8 mm.Right middle lobe subpleural 8 mm ground-glass nodule. Initial follow-up with CT at 6 months is recommended to confirm persistence. If persistent, repeat CT is recommended every 2 years until 5 yearsof stability has been established   Prediabetes -Blood sugar was 246 with no history of diabetes -8/19 hemoglobin A1c= 5.9, unfortunately indicates patient is prediabetic.  A1c of 5.7%  to 6.4% indicates prediabetes  Hx lumbar surgery -No evidence of bony disease on CTA  Hypokalemia - Potassium goal> 4 - 8/19 K-Dur 80 mEq    Hyponatremia - Unable to add normal saline secondary to patient's acute CHF. - If sodium continues to drop will consider bolus 3% saline  Anxiety - Patient anxious given she has few risk factors for acute CHF, and now has EF of 25%. - Ativan 0.5 to 1 mg PRN      Mobility Assessment (last 72 hours)     Mobility Assessment     Row Name 08/27/21 1550 08/26/21 2100 08/26/21 1553 08/26/21 1222     Does patient have an order for bedrest or is patient medically unstable -- No - Continue assessment -- No - Continue assessment    What is the highest level of mobility based on the progressive mobility assessment? Level 5 (Walks with assist in room/hall) - Balance while stepping forward/back and can walk in room with assist - Complete Level 6 (Walks independently in room and hall) - Balance while walking in room without assist - Complete Level 5 (Walks with assist in room/hall) - Balance while stepping forward/back and can walk in room with assist - Complete Level 5 (Walks with assist in room/hall) - Balance while stepping forward/back and can walk in room with assist - Complete                      DVT prophylaxis: Lovenox Code  Status: Full Family Communication: 8/19 son at bedside discussed plan of care all questions answered Status is: Inpatient    Dispo: The patient is from: Home              Anticipated d/c is to: Home              Anticipated d/c date is: > 3 days              Patient currently is not medically stable to d/c.      Consultants:  Cardiology   Procedures/Significant Events:  8/18 Echocardiogram  Left Ventricle: Left ventricular ejection fraction, by estimation, is 20  to 25%. The left ventricle has severely decreased function. The left  ventricle demonstrates global hypokinesis. The average left  ventricular  global longitudinal strain is -5.1 %.  The left ventricular internal cavity size was moderately dilated. There is  no left ventricular hypertrophy. Left ventricular diastolic parameters are  consistent with Grade II diastolic dysfunction (pseudonormalization).   Right Ventricle: The right ventricular size is normal. No increase in  right ventricular wall thickness. Right ventricular systolic function is  normal. There is mildly elevated pulmonary artery systolic pressure. The  tricuspid regurgitant velocity is 2.85   m/s, and with an assumed right atrial pressure of 5 mmHg, the estimated  right ventricular systolic pressure is 17.6 mmHg.   Left Atrium: Left atrial size was normal in size.   Right Atrium: Right atrial size was normal in size.   Pericardium: There is no evidence of pericardial effusion.   Mitral Valve: The mitral valve is normal in structure. Moderate mitral  valve regurgitation. No evidence of mitral valve stenosis.   Tricuspid Valve: The tricuspid valve is normal in structure. Tricuspid  valve regurgitation is not demonstrated. No evidence of tricuspid  stenosis.   Aortic Valve: The aortic valve is normal in structure. Aortic valve  regurgitation is moderate. Aortic regurgitation PHT measures 134 msec. No  aortic stenosis is present. Aortic valve mean gradient measures 2.0 mmHg.  Aortic valve peak gradient measures  3.8 mmHg. Aortic valve area, by VTI measures 1.89 cm.   Pulmonic Valve: The pulmonic valve was normal in structure. Pulmonic valve  regurgitation is not visualized. No evidence of pulmonic stenosis.   Aorta: The aortic root is normal in size and structure.   Venous: The inferior vena cava is normal in size with greater than 50%  respiratory variability, suggesting right atrial pressure of 3 mmHg.   IAS/Shunts: No atrial level shunt detected by color flow Doppler.   I have personally reviewed and interpreted all radiology studies and  my findings are as above.  VENTILATOR SETTINGS:    Cultures   Antimicrobials: Anti-infectives (From admission, onward)    Start     Dose/Rate Route Frequency Ordered Stop   08/26/21 1000  azithromycin (ZITHROMAX) tablet 500 mg        500 mg Oral Daily 08/26/21 0112 08/31/21 0959         Devices    LINES / TUBES:      Continuous Infusions:   Objective: Vitals:   08/28/21 0112 08/28/21 0409 08/28/21 0737 08/28/21 1155  BP: 117/61 118/70 117/69 114/71  Pulse: 95 96 96 (!) 102  Resp: '18 20 18 20  '$ Temp: 98.7 F (37.1 C) 98.5 F (36.9 C) 98.3 F (36.8 C) 98.4 F (36.9 C)  TempSrc: Oral Oral    SpO2: 95% 95% 96% 98%  Weight:      Height:  Intake/Output Summary (Last 24 hours) at 08/28/2021 1305 Last data filed at 08/28/2021 1157 Gross per 24 hour  Intake 240 ml  Output 1500 ml  Net -1260 ml    Filed Weights   08/25/21 1617  Weight: 56.7 kg    Examination:  General: A/O x4 No acute respiratory distress Eyes: negative scleral hemorrhage, negative anisocoria, negative icterus ENT: Negative Runny nose, negative gingival bleeding, Neck:  Negative scars, masses, torticollis, lymphadenopathy, JVD Lungs: Clear to auscultation bilaterally without wheezes or crackles Cardiovascular: Regular rate and rhythm without murmur gallop or rub normal S1 and S2 Abdomen: negative abdominal pain, nondistended, positive soft, bowel sounds, no rebound, no ascites, no appreciable mass Extremities: No significant cyanosis, clubbing, or edema bilateral lower extremities Skin: Negative rashes, lesions, ulcers Psychiatric:  Negative depression, positive anxiety, negative fatigue, negative mania  Central nervous system:  Cranial nerves II through XII intact, tongue/uvula midline, all extremities muscle strength 5/5, sensation intact throughout,  negative dysarthria, negative expressive aphasia, negative receptive aphasia  .     Data Reviewed: Care during the described  time interval was provided by me .  I have reviewed this patient's available data, including medical history, events of note, physical examination, and all test results as part of my evaluation.  CBC: Recent Labs  Lab 08/25/21 1638 08/27/21 0833 08/28/21 0603  WBC 6.6 8.8 7.4  NEUTROABS 5.7 6.3 4.0  HGB 11.5* 11.1* 11.6*  HCT 34.9* 33.2* 34.1*  MCV 92.8 91.0 88.8  PLT 232 205 147    Basic Metabolic Panel: Recent Labs  Lab 08/25/21 1638 08/26/21 0336 08/27/21 0833 08/28/21 0603  NA 131* 132* 129* 134*  K 3.5 3.8 3.3* 3.3*  CL 98 99 92* 99  CO2 21* '25 26 28  '$ GLUCOSE 246* 184* 129* 95  BUN '14 13 17 17  '$ CREATININE 0.84 0.63 0.81 0.74  CALCIUM 9.3 8.9 8.7* 8.6*  MG  --  2.0 2.0 2.2  PHOS  --   --  3.4 3.3    GFR: Estimated Creatinine Clearance: 46.6 mL/min (by C-G formula based on SCr of 0.74 mg/dL). Liver Function Tests: Recent Labs  Lab 08/25/21 1638 08/27/21 0833 08/28/21 0603  AST 45* 26 26  ALT 35 31 33  ALKPHOS 81 63 60  BILITOT 0.7 0.4 0.4  PROT 7.5 6.6 6.5  ALBUMIN 4.1 3.7 3.7    No results for input(s): "LIPASE", "AMYLASE" in the last 168 hours. No results for input(s): "AMMONIA" in the last 168 hours. Coagulation Profile: No results for input(s): "INR", "PROTIME" in the last 168 hours. Cardiac Enzymes: No results for input(s): "CKTOTAL", "CKMB", "CKMBINDEX", "TROPONINI" in the last 168 hours. BNP (last 3 results) No results for input(s): "PROBNP" in the last 8760 hours. HbA1C: No results for input(s): "HGBA1C" in the last 72 hours. CBG: No results for input(s): "GLUCAP" in the last 168 hours. Lipid Profile: Recent Labs    08/28/21 0603  CHOL 183  HDL 56  LDLCALC 97  TRIG 151*  CHOLHDL 3.3   Thyroid Function Tests: Recent Labs    08/25/21 1638  TSH 1.052    Anemia Panel: No results for input(s): "VITAMINB12", "FOLATE", "FERRITIN", "TIBC", "IRON", "RETICCTPCT" in the last 72 hours. Sepsis Labs: Recent Labs  Lab 08/27/21 2039  08/28/21 0603  PROCALCITON <0.10 <0.10    Recent Results (from the past 240 hour(s))  Resp Panel by RT-PCR (Flu A&B, Covid) Anterior Nasal Swab     Status: None   Collection Time: 08/25/21  4:38  PM   Specimen: Anterior Nasal Swab  Result Value Ref Range Status   SARS Coronavirus 2 by RT PCR NEGATIVE NEGATIVE Final    Comment: (NOTE) SARS-CoV-2 target nucleic acids are NOT DETECTED.  The SARS-CoV-2 RNA is generally detectable in upper respiratory specimens during the acute phase of infection. The lowest concentration of SARS-CoV-2 viral copies this assay can detect is 138 copies/mL. A negative result does not preclude SARS-Cov-2 infection and should not be used as the sole basis for treatment or other patient management decisions. A negative result may occur with  improper specimen collection/handling, submission of specimen other than nasopharyngeal swab, presence of viral mutation(s) within the areas targeted by this assay, and inadequate number of viral copies(<138 copies/mL). A negative result must be combined with clinical observations, patient history, and epidemiological information. The expected result is Negative.  Fact Sheet for Patients:  EntrepreneurPulse.com.au  Fact Sheet for Healthcare Providers:  IncredibleEmployment.be  This test is no t yet approved or cleared by the [place] FDA and  has been authorized for detection and/or diagnosis of SARS-CoV-2 by FDA under an Emergency Use Authorization (EUA). This EUA will remain  in effect (meaning this test can be used) for the duration of the COVID-19 declaration under Section 564(b)(1) of the Act, 21 U.S.C.section 360bbb-3(b)(1), unless the authorization is terminated  or revoked sooner.       Influenza A by PCR NEGATIVE NEGATIVE Final   Influenza B by PCR NEGATIVE NEGATIVE Final    Comment: (NOTE) The Xpert Xpress SARS-CoV-2/FLU/RSV plus assay is intended as an aid in  the diagnosis of influenza from Nasopharyngeal swab specimens and should not be used as a sole basis for treatment. Nasal washings and aspirates are unacceptable for Xpert Xpress SARS-CoV-2/FLU/RSV testing.  Fact Sheet for Patients: EntrepreneurPulse.com.au  Fact Sheet for Healthcare Providers: IncredibleEmployment.be  This test is not yet approved or cleared by the [place] FDA and has been authorized for detection and/or diagnosis of SARS-CoV-2 by FDA under an Emergency Use Authorization (EUA). This EUA will remain in effect (meaning this test can be used) for the duration of the COVID-19 declaration under Section 564(b)(1) of the Act, 21 U.S.C. section 360bbb-3(b)(1), unless the authorization is terminated or revoked.  Performed at [facility], [place], [place] [address]   Respiratory (~20 pathogens) panel by PCR     Status: None   Collection Time: 08/27/21  1:30 PM   Specimen: Nasopharyngeal Swab; Respiratory  Result Value Ref Range Status   Adenovirus NOT DETECTED NOT DETECTED Final   Coronavirus 229E NOT DETECTED NOT DETECTED Final    Comment: (NOTE) The Coronavirus on the Respiratory Panel, DOES NOT test for the novel  Coronavirus (2019 nCoV)    Coronavirus HKU1 NOT DETECTED NOT DETECTED Final   Coronavirus NL63 NOT DETECTED NOT DETECTED Final   Coronavirus OC43 NOT DETECTED NOT DETECTED Final   Metapneumovirus NOT DETECTED NOT DETECTED Final   Rhinovirus / Enterovirus NOT DETECTED NOT DETECTED Final   Influenza A NOT DETECTED NOT DETECTED Final   Influenza B NOT DETECTED NOT DETECTED Final   Parainfluenza Virus 1 NOT DETECTED NOT DETECTED Final   Parainfluenza Virus 2 NOT DETECTED NOT DETECTED Final   Parainfluenza Virus 3 NOT DETECTED NOT DETECTED Final   Parainfluenza Virus 4 NOT DETECTED NOT DETECTED Final   Respiratory Syncytial Virus NOT DETECTED NOT DETECTED Final   Bordetella pertussis NOT  DETECTED NOT DETECTED Final   Bordetella Parapertussis NOT DETECTED NOT DETECTED  Final   Chlamydophila pneumoniae NOT DETECTED NOT DETECTED Final   Mycoplasma pneumoniae NOT DETECTED NOT DETECTED Final    Comment: Performed at [facility], [place] [address] [place], [place] [address]         Radiology Studies: No results found.      Scheduled Meds:  aspirin EC  81 mg Oral Daily   azithromycin  500 mg Oral Daily   dapagliflozin propanediol  10 mg Oral Daily   enoxaparin (LOVENOX) injection  40 mg Subcutaneous Q24H   furosemide  40 mg Intravenous Q12H   losartan  12.5 mg Oral Daily   metoprolol succinate  12.5 mg Oral Daily   potassium chloride  40 mEq Oral Daily   predniSONE  40 mg Oral Q breakfast   simvastatin  40 mg Oral q1800   sodium chloride flush  3 mL Intravenous Q12H   Continuous Infusions:   LOS: 2 days    Time spent:40 min    Jaden Abreu, Geraldo Docker, MD [facility]   If 7PM-7AM, please contact night-coverage 08/28/2021, 1:05 PM

## 2021-08-28 NOTE — Progress Notes (Signed)
Progress Note  Patient Name: Glenda May Date of Encounter: 08/28/2021  [facility] Cardiologist: Ida Rogue, MD   Subjective   Patient seen on AM rounds. Denies any chest pain and endorses improvement in breathing. -1.5 L output in the last 24 hours. Potassium remains at 3.3, additional supplement ordered. No over night events.   Inpatient Medications    Scheduled Meds:  aspirin EC  81 mg Oral Daily   azithromycin  500 mg Oral Daily   dapagliflozin propanediol  10 mg Oral Daily   enoxaparin (LOVENOX) injection  40 mg Subcutaneous Q24H   furosemide  40 mg Intravenous Q12H   losartan  12.5 mg Oral Daily   potassium chloride  40 mEq Oral Daily   potassium chloride  40 mEq Oral Once   predniSONE  40 mg Oral Q breakfast   simvastatin  40 mg Oral q1800   sodium chloride flush  3 mL Intravenous Q12H   Continuous Infusions:  PRN Meds: acetaminophen, cyclobenzaprine, ipratropium-albuterol, LORazepam, morphine injection, morphine injection, nitroGLYCERIN, ondansetron (ZOFRAN) IV   Vital Signs    Vitals:   08/27/21 2111 08/28/21 0112 08/28/21 0409 08/28/21 0737  BP: 119/76 117/61 118/70 117/69  Pulse: 97 95 96 96  Resp: '19 18 20 18  '$ Temp: 98.6 F (37 C) 98.7 F (37.1 C) 98.5 F (36.9 C) 98.3 F (36.8 C)  TempSrc: Oral Oral Oral   SpO2: 99% 95% 95% 96%  Weight:      Height:        Intake/Output Summary (Last 24 hours) at 08/28/2021 0846 Last data filed at 08/28/2021 0415 Gross per 24 hour  Intake --  Output 1000 ml  Net -1000 ml      08/25/2021    4:17 PM 06/11/2020    6:10 PM 06/30/2015    8:23 PM  Last 3 Weights  Weight (lbs) 125 lb 132 lb 132 lb  Weight (kg) 56.7 kg 59.875 kg 59.875 kg      Telemetry    Sr-ST rates of 80-100,  1 episode 5 beat NSVT - Personally Reviewed  ECG    No new tracings - Personally Reviewed  Physical Exam   GEN: No acute distress.   Neck: No JVD Cardiac: RRR, no murmurs, rubs, or gallops.  Respiratory: Clear  upper lobes, crackles in bases to auscultation bilaterally.Respirations are unlabored on room air at rest. GI: Soft, nontender, non-distended  MS: No edema; No deformity. Neuro:  Nonfocal  Psych: Normal affect   Labs    High Sensitivity Troponin:   Recent Labs  Lab 08/25/21 1638 08/25/21 1835 08/26/21 0336 08/26/21 1734  TROPONINIHS 15 22* 33* 31*     Chemistry Recent Labs  Lab 08/25/21 1638 08/26/21 0336 08/27/21 0833 08/28/21 0603  NA 131* 132* 129* 134*  K 3.5 3.8 3.3* 3.3*  CL 98 99 92* 99  CO2 21* '25 26 28  '$ GLUCOSE 246* 184* 129* 95  BUN '14 13 17 17  '$ CREATININE 0.84 0.63 0.81 0.74  CALCIUM 9.3 8.9 8.7* 8.6*  MG  --  2.0 2.0 2.2  PROT 7.5  --  6.6 6.5  ALBUMIN 4.1  --  3.7 3.7  AST 45*  --  26 26  ALT 35  --  31 33  ALKPHOS 81  --  63 60  BILITOT 0.7  --  0.4 0.4  GFRNONAA >60 >60 >60 >60  ANIONGAP '12 8 11 7    '$ Lipids  Recent Labs  Lab 08/28/21 0603  CHOL 183  TRIG 151*  HDL 56  LDLCALC 97  CHOLHDL 3.3    Hematology Recent Labs  Lab 08/25/21 1638 08/27/21 0833 08/28/21 0603  WBC 6.6 8.8 7.4  RBC 3.76* 3.65* 3.84*  HGB 11.5* 11.1* 11.6*  HCT 34.9* 33.2* 34.1*  MCV 92.8 91.0 88.8  MCH 30.6 30.4 30.2  MCHC 33.0 33.4 34.0  RDW 13.3 13.2 13.1  PLT 232 205 219   Thyroid  Recent Labs  Lab 08/25/21 1638  TSH 1.052    BNP Recent Labs  Lab 08/26/21 0140  BNP 2,082.6*    DDimer  Recent Labs  Lab 08/26/21 0336  DDIMER 0.69*     Radiology     Cardiac Studies  Echocardiogram 08/26/2021 1. Left ventricular ejection fraction, by estimation, is 20 to 25%. The  left ventricle has severely decreased function. The left ventricle  demonstrates global hypokinesis. The left ventricular internal cavity size  was moderately dilated. Left  ventricular diastolic parameters are consistent with Grade II diastolic  dysfunction (pseudonormalization). The average left ventricular global  longitudinal strain is -5.1 %.   2. Right ventricular  systolic function is normal. The right ventricular  size is normal. There is mildly elevated pulmonary artery systolic  pressure.   3. The mitral valve is normal in structure. Moderate mitral valve  regurgitation. No evidence of mitral stenosis.   4. The aortic valve is normal in structure. Aortic valve regurgitation is  moderate. No aortic stenosis is present.   5. The inferior vena cava is normal in size with greater than 50%  respiratory variability, suggesting right atrial pressure of 3 mmHg.   Patient Profile     76 y.o. female with a history of allergic rhinitis, HTN, and HLD who [presented on 8/16 with a 2 week history of cough, chest/sinus congestion, back pain, pleuritic pain/chest wall tenderness, and progressive dyspnea, who was found to have CHF with an EF 20-25%.  Assessment & Plan     Acute systolic CHF/cardiomyopathy - -1.5 L output in the last 24 hours - notes breathing is improved but not back to baseline - Echo revealed LVEF 20-25% - Continue furosemide IVP bid - continue losartan 12.5 mg daily - continue farxiga 10 mg daily - added toprol xl 12.5 mg daily starting today - consider escalating GDMT by adding MRA if blood pressure tolerates - R/LHC scheduled for Monday  Demand ischemia -in the setting of acute systolic CHF - Hs troponins trended 15-22-33-31, remained flat - R/LHC scheduled for Monday  - continue asa - recommend change in statin therapy  Essential hypertension - blood pressure 117/69 - currently stable - vitals per unit protocol  Hyperlipidemia - LDL 97, not at goal - recommend changing simvastatin 40 mg daily to rosuvastatin to better reduction of LDL  R sided pulmonary nodules -will require outpatient follow up CT scan in 6 months  Moderate MR/AI - noted on echocardiogram - R & L heart cath as planned  7.   Hypokalemia  - potassium level 3.3 - has remained 3.3 over last several days even with daily supplements - additional  supplement ordered this morning - daily bmp  For questions or updates, please contact [place] Please consult [email or web address] for contact info under        Signed, Shernell Saldierna, NP  08/28/2021, 8:46 AM

## 2021-08-29 DIAGNOSIS — R0789 Other chest pain: Secondary | ICD-10-CM | POA: Diagnosis not present

## 2021-08-29 DIAGNOSIS — R918 Other nonspecific abnormal finding of lung field: Secondary | ICD-10-CM

## 2021-08-29 DIAGNOSIS — J40 Bronchitis, not specified as acute or chronic: Secondary | ICD-10-CM | POA: Diagnosis not present

## 2021-08-29 DIAGNOSIS — R778 Other specified abnormalities of plasma proteins: Secondary | ICD-10-CM | POA: Diagnosis not present

## 2021-08-29 DIAGNOSIS — I5021 Acute systolic (congestive) heart failure: Secondary | ICD-10-CM | POA: Diagnosis not present

## 2021-08-29 DIAGNOSIS — R0902 Hypoxemia: Secondary | ICD-10-CM

## 2021-08-29 DIAGNOSIS — I420 Dilated cardiomyopathy: Secondary | ICD-10-CM | POA: Diagnosis not present

## 2021-08-29 LAB — COMPREHENSIVE METABOLIC PANEL
ALT: 43 U/L (ref 0–44)
AST: 25 U/L (ref 15–41)
Albumin: 3.8 g/dL (ref 3.5–5.0)
Alkaline Phosphatase: 62 U/L (ref 38–126)
Anion gap: 7 (ref 5–15)
BUN: 20 mg/dL (ref 8–23)
CO2: 28 mmol/L (ref 22–32)
Calcium: 9 mg/dL (ref 8.9–10.3)
Chloride: 99 mmol/L (ref 98–111)
Creatinine, Ser: 0.83 mg/dL (ref 0.44–1.00)
GFR, Estimated: >60 mL/min (ref >60)
Glucose, Bld: 110 mg/dL — ABNORMAL HIGH (ref 70–99)
Potassium: 4.7 mmol/L (ref 3.5–5.1)
Sodium: 134 mmol/L — ABNORMAL LOW (ref 135–145)
Total Bilirubin: 0.7 mg/dL (ref 0.3–1.2)
Total Protein: 6.9 g/dL (ref 6.5–8.1)

## 2021-08-29 LAB — CBC WITH DIFFERENTIAL/PLATELET
Abs Immature Granulocytes: 0.03 10*3/uL (ref 0.00–0.07)
Basophils Absolute: 0 10*3/uL (ref 0.0–0.1)
Basophils Relative: 0 %
Eosinophils Absolute: 0 10*3/uL (ref 0.0–0.5)
Eosinophils Relative: 0 %
HCT: 37.8 % (ref 36.0–46.0)
Hemoglobin: 12.4 g/dL (ref 12.0–15.0)
Immature Granulocytes: 0 %
Lymphocytes Relative: 27 %
Lymphs Abs: 2.2 10*3/uL (ref 0.7–4.0)
MCH: 29.7 pg (ref 26.0–34.0)
MCHC: 32.8 g/dL (ref 30.0–36.0)
MCV: 90.6 fL (ref 80.0–100.0)
Monocytes Absolute: 0.8 10*3/uL (ref 0.1–1.0)
Monocytes Relative: 10 %
Neutro Abs: 5.1 10*3/uL (ref 1.7–7.7)
Neutrophils Relative %: 63 %
Platelets: 233 10*3/uL (ref 150–400)
RBC: 4.17 MIL/uL (ref 3.87–5.11)
RDW: 13.2 % (ref 11.5–15.5)
WBC: 8.1 10*3/uL (ref 4.0–10.5)
nRBC: 0 % (ref 0.0–0.2)

## 2021-08-29 LAB — PROCALCITONIN: Procalcitonin: <0.1 ng/mL

## 2021-08-29 LAB — PHOSPHORUS: Phosphorus: 4.1 mg/dL (ref 2.5–4.6)

## 2021-08-29 LAB — MAGNESIUM: Magnesium: 2.4 mg/dL (ref 1.7–2.4)

## 2021-08-29 MED ORDER — SODIUM CHLORIDE 0.9 % IV SOLN: INTRAVENOUS | Status: DC

## 2021-08-29 MED ORDER — SODIUM CHLORIDE 0.9 % IV SOLN: 250.0000 mL | INTRAVENOUS | Status: DC | PRN

## 2021-08-29 MED ORDER — SODIUM CHLORIDE 0.9% FLUSH: 3.0000 mL | INTRAVENOUS | Status: DC | PRN

## 2021-08-29 MED ORDER — ROSUVASTATIN CALCIUM 10 MG PO TABS
20.0000 mg | ORAL_TABLET | Freq: Every day | ORAL | Status: DC
  Administered 2021-08-29 – 2021-08-31 (×2): 20 mg via ORAL
  Filled 2021-08-29 (×2): qty 2

## 2021-08-29 MED ORDER — ASPIRIN 81 MG PO CHEW
81.0000 mg | CHEWABLE_TABLET | ORAL | Status: AC
  Administered 2021-08-30: 81 mg via ORAL
  Filled 2021-08-29: qty 1

## 2021-08-29 NOTE — Progress Notes (Signed)
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  Patient was being treated as an outpatient for possible bronchitis after complaining of sinus congestion, postnasal drip and typical allergy symptoms for the past 2 weeks.  Over the course of the last few days she developed right-sided chest pain of severe intensity radiating from the mid right back to the anterior chest, worse on coughing.  She states her allergy symptoms are improving but the chest pain is worsening.  She denies fever or chills or shortness of breath.  Denies nausea or vomiting.  Denies abdominal pain or change in bowel habits. ED course and data review: Tachycardic to 112, tachypneic 22-30 with normal blood pressures and oxygen saturation.  Labs showing troponin 15-22.  Normal TSH.  WBC normal, hemoglobin 11.5, sodium 131, glucose 246, AST 45.  Sodium 131, COVID and flu negative.  EKG, personally viewed and interpreted with sinus rhythm at 87, prolonged QT interval. CTA chest showed the following findings: IMPRESSION: 1. No acute thoracic abnormality. 2. No pulmonary embolus. 3. Couple right upper lobe pulmonary nodules measuring 7 and 8 mm. Right middle lobe subpleural 8 mm ground-glass nodule. Initial follow-up with CT at 6 months is recommended to confirm persistence. If persistent, repeat CT is recommended every 2 years until 5 years of stability has been established. This recommendation follows the consensus statement: Guidelines for Management of Incidental Pulmonary Nodules Detected on CT Images: From the Fleischner Society 2017; Radiology 2017; 284:228-243. 4. Small right and trace left pleural effusions. 5. Cardiomegaly. 6. Couple nonspecific sclerotic regions of the sternum. Correlate with history of prior  nondisplaced fractures versus history of metastatic malignancy. Recommend attention on follow-up. 7. Colonic diverticulosis with no acute diverticulitis. 8. Likely uterine fibroid.   Subjective: 8/20 afebrile overnight A/O x4.  Patient states slept well overnight negative CP, negative SOB.  Currently sitting in bed eating lunch with mild SOB.  States cardiology will perform right/left heart cath 0730 in the AM    Assessment & Plan: Covid vaccination;   Principal Problem:   Acute systolic CHF (congestive heart failure) (HCC) Active Problems:   Chest pain   Elevated troponin   Acute bronchitis   Hypoxia   Sinus tachycardia   Acute CHF (congestive heart failure) (HCC)   Hyperglycemia   Multiple pulmonary nodules   Hypertension   History of lumbar surgery   Dilated cardiomyopathy (HCC)   Prediabetes  Chest pain/ NSTEMI Suspect chest pain is related to recent acute bronchitis however differential includes ischemic chest pain, viral myocarditis, as well as chest pain related to pulmonary nodules and sclerotic bone lesions of sternum seen on CT CTA aorta was negative for PE or acute thoracic abnormality - We will treat acute bronchitis as outlined  - We will continue to trend troponins to evaluate for ACS - Get sed rate, echo, BNP to further evaluate for myocarditis/cardiomyopathy based on findings of pleural effusion and cardiomegaly in the setting of recent respiratory tract infection -Pain control for right-sided atypical chest pain -Follow-up CT chest as outpatient which showed couple nonspecific sclerotic regions of the sternum. Correlate with history of prior nondisplaced fractures versus history ofmetastatic malignancy. Recommend attention on follow-up. -8/19 new onset chest pain with exertion.  Obtain EKG, trend troponin, NTG 0.4  mg SL x3, placed on 2 L O2.  If no resolution of CP and SOB morphine 2 mg   Elevated troponin Troponin 15-22, nonspecific changes on EKG.  Chest  x-ray showed trace left pleural effusions and cardiomegaly  Latest Reference Range & Units 08/25/21 16:38 08/25/21 18:35 08/26/21 03:36 08/26/21 17:34  Troponin I (High Sensitivity) <18 ng/L 15 22 (H) 33 (H) 31 (H)  (H): Data is abnormally high - Empiric NSAID - Echocardiogram: Consistent with acute systolic and diastolic CHF see results below   Acute Systolic and Diastolic CHF (HCC)/ischemic cardiomyopathy? Possible acute CHF given shortness of breath, BNP resulting postadmission over 2000, pleural effusions on chest x-ray We will try Lasix IV and assess for symptomatic relief from shortness of breath, -Strict ins and out -3.5 L - Daily weight Filed Weights   08/25/21 1617  Weight: 56.7 kg  -8/18 per cardiology right and left heart catheterization on Monday  Sinus tachycardia -TSH was checked and was normal.  Multifactorial (anxiety, pain) -CTA was negative for PE  Essential HTN - Meds per cardiology    Hypoxia -Was mildly hypoxic to the low 90s in the ED and required 2 L -Wean as tolerated   Acute bronchitis Chest x-ray showed airspace and interstitial opacities in the lower lungs favored to represent pulmonary edema though infection is not excluded. - We will treat with Z-Pak, DuoNeb as needed and oral prednisone -8/18 do not believe secondary to bronchitis will obtain procalcitonin adjust antibiotics. -8/19 procalcitonin<0.10, which indicates no need for antibiotics and steroids will discontinue.  Multiple pulmonary nodules CTA showed: Couple right upper lobe pulmonary nodules measuring 7 and 8 mm.Right middle lobe subpleural 8 mm ground-glass nodule. Initial -Follow-up with CT at 6 months is recommended to confirm persistence. If persistent, repeat CT is recommended every 2 years until 5 yearsof stability has been established   Prediabetes -Blood sugar was 246 with no history of diabetes -8/19 hemoglobin A1c= 5.9, unfortunately indicates patient is prediabetic.  A1c of  5.7% to 6.4% indicates prediabetes  Hx lumbar surgery -No evidence of bony disease on CTA  Hypokalemia - Potassium goal> 4 - 8/19 K-Dur 80 mEq    Hyponatremia - Unable to add normal saline secondary to patient's acute CHF. - If sodium continues to drop will consider bolus 3% saline Lab Results  Component Value Date   NA 134 (L) 08/29/2021   NA 134 (L) 08/28/2021   NA 129 (L) 08/27/2021   NA 132 (L) 08/26/2021   NA 131 (L) 08/25/2021  -Stable  Anxiety - Patient anxious given she has few risk factors for acute CHF, and now has EF of 25%. - Ativan 0.5 to 1 mg PRN      Mobility Assessment (last 72 hours)     Mobility Assessment     Row Name 08/28/21 2200 08/27/21 1550 08/26/21 2100 08/26/21 1553     Does patient have an order for bedrest or is patient medically unstable No - Continue assessment -- No - Continue assessment --    What is the highest level of mobility based on the progressive mobility assessment? Level 5 (Walks with assist in room/hall) - Balance while stepping forward/back and can walk in room with assist - Complete Level 5 (Walks with assist in room/hall) - Balance while stepping forward/back and can walk in room with assist - Complete Level 6 (Walks independently in room and hall) - Balance while walking in room without assist - Complete Level 5 (Walks with assist in room/hall) -  Balance while stepping forward/back and can walk in room with assist - Complete                      DVT prophylaxis: Lovenox Code Status: Full Family Communication: 8/20 family at bedside discussed plan of care all questions answered Status is: Inpatient    Dispo: The patient is from: Home              Anticipated d/c is to: Home              Anticipated d/c date is: 2 days              Patient currently is not medically stable to d/c.      Consultants:  Cardiology   Procedures/Significant Events:  8/18 Echocardiogram  Left Ventricle: LVEF=20 to 25%. The  left ventricle has severely decreased function. The left  ventricle demonstrates global hypokinesis.  -The left ventricular internal cavity size was moderately dilated.  -Left ventricular diastolic parameters are consistent with Grade II diastolic dysfunction (pseudonormalization).  Right Ventricle: mildly elevated pulmonary artery systolic pressure.  -Estimated PASP= 37.5 mmHg.  Mitral Valve: Moderate mitral valve regurgitation..    I have personally reviewed and interpreted all radiology studies and my findings are as above.  VENTILATOR SETTINGS:    Cultures   Antimicrobials: Anti-infectives (From admission, onward)    Start     Dose/Rate Route Frequency Ordered Stop   08/26/21 1000  azithromycin (ZITHROMAX) tablet 500 mg  Status:  Discontinued        500 mg Oral Daily 08/26/21 0112 08/28/21 1318         Devices    LINES / TUBES:      Continuous Infusions:   Objective: Vitals:   08/28/21 2322 08/29/21 0338 08/29/21 0737 08/29/21 1215  BP: 113/78 104/66 113/66 (!) 102/52  Pulse: 88 91 97 83  Resp: '16 16 16   '$ Temp: 97.8 F (36.6 C) 98.2 F (36.8 C)    TempSrc:      SpO2:  96% 100% 100%  Weight:      Height:        Intake/Output Summary (Last 24 hours) at 08/29/2021 1312 Last data filed at 08/29/2021 0900 Gross per 24 hour  Intake 3 ml  Output 2200 ml  Net -2197 ml    Filed Weights   08/25/21 1617  Weight: 56.7 kg    Examination:  General: A/O x4 No acute respiratory distress Eyes: negative scleral hemorrhage, negative anisocoria, negative icterus ENT: Negative Runny nose, negative gingival bleeding, Neck:  Negative scars, masses, torticollis, lymphadenopathy, JVD Lungs: Clear to auscultation bilaterally without wheezes or crackles Cardiovascular: Regular rate and rhythm without murmur gallop or rub normal S1 and S2 Abdomen: negative abdominal pain, nondistended, positive soft, bowel sounds, no rebound, no ascites, no appreciable  mass Extremities: No significant cyanosis, clubbing, or edema bilateral lower extremities Skin: Negative rashes, lesions, ulcers Psychiatric:  Negative depression, positive anxiety, negative fatigue, negative mania  Central nervous system:  Cranial nerves II through XII intact, tongue/uvula midline, all extremities muscle strength 5/5, sensation intact throughout,  negative dysarthria, negative expressive aphasia, negative receptive aphasia  .     Data Reviewed: Care during the described time interval was provided by me .  I have reviewed this patient's available data, including medical history, events of note, physical examination, and all test results as part of my evaluation.  CBC: Recent Labs  Lab 08/25/21 1638 08/27/21 0160 08/28/21 1093  08/29/21 0458  WBC 6.6 8.8 7.4 8.1  NEUTROABS 5.7 6.3 4.0 5.1  HGB 11.5* 11.1* 11.6* 12.4  HCT 34.9* 33.2* 34.1* 37.8  MCV 92.8 91.0 88.8 90.6  PLT 232 205 219 188    Basic Metabolic Panel: Recent Labs  Lab 08/25/21 1638 08/26/21 0336 08/27/21 0833 08/28/21 0603 08/28/21 1323 08/29/21 0458  NA 131* 132* 129* 134*  --  134*  K 3.5 3.8 3.3* 3.3* 5.0 4.7  CL 98 99 92* 99  --  99  CO2 21* '25 26 28  '$ --  28  GLUCOSE 246* 184* 129* 95  --  110*  BUN '14 13 17 17  '$ --  20  CREATININE 0.84 0.63 0.81 0.74  --  0.83  CALCIUM 9.3 8.9 8.7* 8.6*  --  9.0  MG  --  2.0 2.0 2.2 2.4 2.4  PHOS  --   --  3.4 3.3  --  4.1    GFR: Estimated Creatinine Clearance: 44.9 mL/min (by C-G formula based on SCr of 0.83 mg/dL). Liver Function Tests: Recent Labs  Lab 08/25/21 1638 08/27/21 0833 08/28/21 0603 08/29/21 0458  AST 45* '26 26 25  '$ ALT 35 31 33 43  ALKPHOS 81 63 60 62  BILITOT 0.7 0.4 0.4 0.7  PROT 7.5 6.6 6.5 6.9  ALBUMIN 4.1 3.7 3.7 3.8    No results for input(s): "LIPASE", "AMYLASE" in the last 168 hours. No results for input(s): "AMMONIA" in the last 168 hours. Coagulation Profile: No results for input(s): "INR", "PROTIME" in the  last 168 hours. Cardiac Enzymes: No results for input(s): "CKTOTAL", "CKMB", "CKMBINDEX", "TROPONINI" in the last 168 hours. BNP (last 3 results) No results for input(s): "PROBNP" in the last 8760 hours. HbA1C: Recent Labs    08/28/21 0603  HGBA1C 5.9*   CBG: No results for input(s): "GLUCAP" in the last 168 hours. Lipid Profile: Recent Labs    08/28/21 0603  CHOL 183  HDL 56  LDLCALC 97  TRIG 151*  CHOLHDL 3.3    Thyroid Function Tests: No results for input(s): "TSH", "T4TOTAL", "FREET4", "T3FREE", "THYROIDAB" in the last 72 hours.  Anemia Panel: No results for input(s): "VITAMINB12", "FOLATE", "FERRITIN", "TIBC", "IRON", "RETICCTPCT" in the last 72 hours. Sepsis Labs: Recent Labs  Lab 08/27/21 2039 08/28/21 0603 08/29/21 0458  PROCALCITON <0.10 <0.10 <0.10     Recent Results (from the past 240 hour(s))  Resp Panel by RT-PCR (Flu A&B, Covid) Anterior Nasal Swab     Status: None   Collection Time: 08/25/21  4:38 PM   Specimen: Anterior Nasal Swab  Result Value Ref Range Status   SARS Coronavirus 2 by RT PCR NEGATIVE NEGATIVE Final    Comment: (NOTE) SARS-CoV-2 target nucleic acids are NOT DETECTED.  The SARS-CoV-2 RNA is generally detectable in upper respiratory specimens during the acute phase of infection. The lowest concentration of SARS-CoV-2 viral copies this assay can detect is 138 copies/mL. A negative result does not preclude SARS-Cov-2 infection and should not be used as the sole basis for treatment or other patient management decisions. A negative result may occur with  improper specimen collection/handling, submission of specimen other than nasopharyngeal swab, presence of viral mutation(s) within the areas targeted by this assay, and inadequate number of viral copies(<138 copies/mL). A negative result must be combined with clinical observations, patient history, and epidemiological information. The expected result is Negative.  Fact Sheet for  Patients:  EntrepreneurPulse.com.au  Fact Sheet for Healthcare Providers:  IncredibleEmployment.be  This test  is no t yet approved or cleared by the [place] and  has been authorized for detection and/or diagnosis of SARS-CoV-2 by FDA under an Emergency Use Authorization (EUA). This EUA will remain  in effect (meaning this test can be used) for the duration of the COVID-19 declaration under Section 564(b)(1) of the Act, 21 U.S.C.section 360bbb-3(b)(1), unless the authorization is terminated  or revoked sooner.       Influenza A by PCR NEGATIVE NEGATIVE Final   Influenza B by PCR NEGATIVE NEGATIVE Final    Comment: (NOTE) The Xpert Xpress SARS-CoV-2/FLU/RSV plus assay is intended as an aid in the diagnosis of influenza from Nasopharyngeal swab specimens and should not be used as a sole basis for treatment. Nasal washings and aspirates are unacceptable for Xpert Xpress SARS-CoV-2/FLU/RSV testing.  Fact Sheet for Patients: EntrepreneurPulse.com.au  Fact Sheet for Healthcare Providers: IncredibleEmployment.be  This test is not yet approved or cleared by the [place] FDA and has been authorized for detection and/or diagnosis of SARS-CoV-2 by FDA under an Emergency Use Authorization (EUA). This EUA will remain in effect (meaning this test can be used) for the duration of the COVID-19 declaration under Section 564(b)(1) of the Act, 21 U.S.C. section 360bbb-3(b)(1), unless the authorization is terminated or revoked.  Performed at [facility], [place], [place] [address]   Respiratory (~20 pathogens) panel by PCR     Status: None   Collection Time: 08/27/21  1:30 PM   Specimen: Nasopharyngeal Swab; Respiratory  Result Value Ref Range Status   Adenovirus NOT DETECTED NOT DETECTED Final   Coronavirus 229E NOT DETECTED NOT DETECTED Final    Comment: (NOTE) The Coronavirus  on the Respiratory Panel, DOES NOT test for the novel  Coronavirus (2019 nCoV)    Coronavirus HKU1 NOT DETECTED NOT DETECTED Final   Coronavirus NL63 NOT DETECTED NOT DETECTED Final   Coronavirus OC43 NOT DETECTED NOT DETECTED Final   Metapneumovirus NOT DETECTED NOT DETECTED Final   Rhinovirus / Enterovirus NOT DETECTED NOT DETECTED Final   Influenza A NOT DETECTED NOT DETECTED Final   Influenza B NOT DETECTED NOT DETECTED Final   Parainfluenza Virus 1 NOT DETECTED NOT DETECTED Final   Parainfluenza Virus 2 NOT DETECTED NOT DETECTED Final   Parainfluenza Virus 3 NOT DETECTED NOT DETECTED Final   Parainfluenza Virus 4 NOT DETECTED NOT DETECTED Final   Respiratory Syncytial Virus NOT DETECTED NOT DETECTED Final   Bordetella pertussis NOT DETECTED NOT DETECTED Final   Bordetella Parapertussis NOT DETECTED NOT DETECTED Final   Chlamydophila pneumoniae NOT DETECTED NOT DETECTED Final   Mycoplasma pneumoniae NOT DETECTED NOT DETECTED Final    Comment: Performed at [facility], [place]. [address] [place], [place] [address]         Radiology Studies: No results found.      Scheduled Meds:  aspirin EC  81 mg Oral Daily   dapagliflozin propanediol  10 mg Oral Daily   enoxaparin (LOVENOX) injection  40 mg Subcutaneous Q24H   furosemide  40 mg Intravenous Q12H   losartan  12.5 mg Oral Daily   metoprolol succinate  12.5 mg Oral Daily   potassium chloride  10 mEq Oral Daily   rosuvastatin  20 mg Oral Daily   sodium chloride flush  3 mL Intravenous Q12H   Continuous Infusions:   LOS: 3 days    Time spent:40 min    Sincere Berlanga, Geraldo Docker, MD [facility]   If 7PM-7AM, please  contact night-coverage 08/29/2021, 1:12 PM

## 2021-08-29 NOTE — Progress Notes (Signed)
Progress Note  Patient Name: Glenda May Date of Encounter: 08/29/2021  [facility] Cardiologist: Ida Rogue, MD   Subjective   Reports that she feels much better, flank pain back pain right breast pain improving, currently without pain Reports her breathing is improving -1 L or more in the past 24 hours on IV Lasix, notes indicating -3.6 liters total  Inpatient Medications    Scheduled Meds:  aspirin EC  81 mg Oral Daily   dapagliflozin propanediol  10 mg Oral Daily   enoxaparin (LOVENOX) injection  40 mg Subcutaneous Q24H   furosemide  40 mg Intravenous Q12H   losartan  12.5 mg Oral Daily   metoprolol succinate  12.5 mg Oral Daily   potassium chloride  10 mEq Oral Daily   rosuvastatin  20 mg Oral Daily   sodium chloride flush  3 mL Intravenous Q12H   Continuous Infusions:  PRN Meds: acetaminophen, cyclobenzaprine, ipratropium-albuterol, LORazepam, morphine injection, nitroGLYCERIN, ondansetron (ZOFRAN) IV   Vital Signs    Vitals:   08/28/21 2322 08/29/21 0338 08/29/21 0737 08/29/21 1215  BP: 113/78 104/66 113/66 (!) 102/52  Pulse: 88 91 97 83  Resp: '16 16 16   '$ Temp: 97.8 F (36.6 C) 98.2 F (36.8 C)    TempSrc:      SpO2:  96% 100% 100%  Weight:      Height:        Intake/Output Summary (Last 24 hours) at 08/29/2021 1312 Last data filed at 08/29/2021 0900 Gross per 24 hour  Intake 3 ml  Output 2200 ml  Net -2197 ml      08/25/2021    4:17 PM 06/11/2020    6:10 PM 06/30/2015    8:23 PM  Last 3 Weights  Weight (lbs) 125 lb 132 lb 132 lb  Weight (kg) 56.7 kg 59.875 kg 59.875 kg      Telemetry    Normal sinus rhythm- Personally Reviewed  ECG     - Personally Reviewed  Physical Exam   GEN: No acute distress.   Neck: No JVD Cardiac: RRR, no murmurs, rubs, or gallops.  Respiratory: Clear to auscultation bilaterally. GI: Soft, nontender, non-distended  MS: No edema; No deformity. Neuro:  Nonfocal  Psych: Normal affect   Labs     High Sensitivity Troponin:   Recent Labs  Lab 08/26/21 0336 08/26/21 1734 08/28/21 1323 08/28/21 1448 08/28/21 1810  TROPONINIHS 33* 31* 20* 19* 19*     Chemistry Recent Labs  Lab 08/27/21 0833 08/28/21 0603 08/28/21 1323 08/29/21 0458  NA 129* 134*  --  134*  K 3.3* 3.3* 5.0 4.7  CL 92* 99  --  99  CO2 26 28  --  28  GLUCOSE 129* 95  --  110*  BUN 17 17  --  20  CREATININE 0.81 0.74  --  0.83  CALCIUM 8.7* 8.6*  --  9.0  MG 2.0 2.2 2.4 2.4  PROT 6.6 6.5  --  6.9  ALBUMIN 3.7 3.7  --  3.8  AST 26 26  --  25  ALT 31 33  --  43  ALKPHOS 63 60  --  62  BILITOT 0.4 0.4  --  0.7  GFRNONAA >60 >60  --  >60  ANIONGAP 11 7  --  7    Lipids  Recent Labs  Lab 08/28/21 0603  CHOL 183  TRIG 151*  HDL 56  LDLCALC 97  CHOLHDL 3.3    Hematology Recent Labs  Lab 08/27/21 0833 08/28/21 0603 08/29/21 0458  WBC 8.8 7.4 8.1  RBC 3.65* 3.84* 4.17  HGB 11.1* 11.6* 12.4  HCT 33.2* 34.1* 37.8  MCV 91.0 88.8 90.6  MCH 30.4 30.2 29.7  MCHC 33.4 34.0 32.8  RDW 13.2 13.1 13.2  PLT 205 219 233   Thyroid  Recent Labs  Lab 08/25/21 1638  TSH 1.052    BNP Recent Labs  Lab 08/26/21 0140  BNP 2,082.6*    DDimer  Recent Labs  Lab 08/26/21 0336  DDIMER 0.69*     Radiology    No results found.  Cardiac Studies   Echocardiogram  1. Left ventricular ejection fraction, by estimation, is 20 to 25%. The  left ventricle has severely decreased function. The left ventricle  demonstrates global hypokinesis. The left ventricular internal cavity size  was moderately dilated. Left  ventricular diastolic parameters are consistent with Grade II diastolic  dysfunction (pseudonormalization). The average left ventricular global  longitudinal strain is -5.1 %.   2. Right ventricular systolic function is normal. The right ventricular  size is normal. There is mildly elevated pulmonary artery systolic  pressure.   3. The mitral valve is normal in structure. Moderate mitral  valve  regurgitation. No evidence of mitral stenosis.   4. The aortic valve is normal in structure. Aortic valve regurgitation is  moderate. No aortic stenosis is present.   5. The inferior vena cava is normal in size with greater than 50%  respiratory variability, suggesting right atrial pressure of 3 mmHg.   Patient Profile     76 y.o. female with a history of allergic rhinitis, HTN, and HLD who [presented on 8/16 with a 2 week history of cough, chest/sinus congestion, back pain, pleuritic pain/chest wall tenderness, and progressive dyspnea, who was found to have CHF with an EF 20-25%.  Assessment & Plan  Dilated cardiomyopathy/acute systolic CHF Echocardiogram with ejection fraction 25%, moderately dilated LV global hypokinesis Unable to exclude ischemic cardiomyopathy though few risk factors(non-smoker, no significant diabetes) -Would recommend we continue Lasix 40 IV twice daily, stable renal function, still with pleural effusion Tolerating low-dose losartan and metoprolol, blood pressure low limiting further titration -- right and left heart cardiac catheterization on Monday   Acute respiratory failure/right chest wall pain In the setting of cardiomyopathy, severely reduced ejection fraction -Pleural effusion likely contributing to chest wall pain CT scan negative for PE Continue IV Lasix, goal-directed medical therapy for cardiomyopathy   Back pain, chest pain Followed by Dr. Sharlet Salina as outpatient Atypical, likely musculoskeletal in the setting of cough Possible exacerbation from pleural effusion, too small it would appear from imaging for thoracentesis Slow improvement daily, pain improved today On Flexeril, Toradol, morphine as needed    Total encounter time more than 35 minutes  Greater than 50% was spent in counseling and coordination of care with the patient   For questions or updates, please contact [facility] Please consult [email or web address] for contact info under         Signed, Ida Rogue, MD  08/29/2021, 1:12 PM

## 2021-08-29 NOTE — H&P (View-Only) (Signed)
Progress Note  Patient Name: Glenda May Date of Encounter: 08/29/2021  [facility] Cardiologist: Ida Rogue, MD   Subjective   Reports that she feels much better, flank pain back pain right breast pain improving, currently without pain Reports her breathing is improving -1 L or more in the past 24 hours on IV Lasix, notes indicating -3.6 liters total  Inpatient Medications    Scheduled Meds:  aspirin EC  81 mg Oral Daily   dapagliflozin propanediol  10 mg Oral Daily   enoxaparin (LOVENOX) injection  40 mg Subcutaneous Q24H   furosemide  40 mg Intravenous Q12H   losartan  12.5 mg Oral Daily   metoprolol succinate  12.5 mg Oral Daily   potassium chloride  10 mEq Oral Daily   rosuvastatin  20 mg Oral Daily   sodium chloride flush  3 mL Intravenous Q12H   Continuous Infusions:  PRN Meds: acetaminophen, cyclobenzaprine, ipratropium-albuterol, LORazepam, morphine injection, nitroGLYCERIN, ondansetron (ZOFRAN) IV   Vital Signs    Vitals:   08/28/21 2322 08/29/21 0338 08/29/21 0737 08/29/21 1215  BP: 113/78 104/66 113/66 (!) 102/52  Pulse: 88 91 97 83  Resp: '16 16 16   '$ Temp: 97.8 F (36.6 C) 98.2 F (36.8 C)    TempSrc:      SpO2:  96% 100% 100%  Weight:      Height:        Intake/Output Summary (Last 24 hours) at 08/29/2021 1312 Last data filed at 08/29/2021 0900 Gross per 24 hour  Intake 3 ml  Output 2200 ml  Net -2197 ml      08/25/2021    4:17 PM 06/11/2020    6:10 PM 06/30/2015    8:23 PM  Last 3 Weights  Weight (lbs) 125 lb 132 lb 132 lb  Weight (kg) 56.7 kg 59.875 kg 59.875 kg      Telemetry    Normal sinus rhythm- Personally Reviewed  ECG     - Personally Reviewed  Physical Exam   GEN: No acute distress.   Neck: No JVD Cardiac: RRR, no murmurs, rubs, or gallops.  Respiratory: Clear to auscultation bilaterally. GI: Soft, nontender, non-distended  MS: No edema; No deformity. Neuro:  Nonfocal  Psych: Normal affect   Labs     High Sensitivity Troponin:   Recent Labs  Lab 08/26/21 0336 08/26/21 1734 08/28/21 1323 08/28/21 1448 08/28/21 1810  TROPONINIHS 33* 31* 20* 19* 19*     Chemistry Recent Labs  Lab 08/27/21 0833 08/28/21 0603 08/28/21 1323 08/29/21 0458  NA 129* 134*  --  134*  K 3.3* 3.3* 5.0 4.7  CL 92* 99  --  99  CO2 26 28  --  28  GLUCOSE 129* 95  --  110*  BUN 17 17  --  20  CREATININE 0.81 0.74  --  0.83  CALCIUM 8.7* 8.6*  --  9.0  MG 2.0 2.2 2.4 2.4  PROT 6.6 6.5  --  6.9  ALBUMIN 3.7 3.7  --  3.8  AST 26 26  --  25  ALT 31 33  --  43  ALKPHOS 63 60  --  62  BILITOT 0.4 0.4  --  0.7  GFRNONAA >60 >60  --  >60  ANIONGAP 11 7  --  7    Lipids  Recent Labs  Lab 08/28/21 0603  CHOL 183  TRIG 151*  HDL 56  LDLCALC 97  CHOLHDL 3.3    Hematology Recent Labs  Lab 08/27/21 0833 08/28/21 0603 08/29/21 0458  WBC 8.8 7.4 8.1  RBC 3.65* 3.84* 4.17  HGB 11.1* 11.6* 12.4  HCT 33.2* 34.1* 37.8  MCV 91.0 88.8 90.6  MCH 30.4 30.2 29.7  MCHC 33.4 34.0 32.8  RDW 13.2 13.1 13.2  PLT 205 219 233   Thyroid  Recent Labs  Lab 08/25/21 1638  TSH 1.052    BNP Recent Labs  Lab 08/26/21 0140  BNP 2,082.6*    DDimer  Recent Labs  Lab 08/26/21 0336  DDIMER 0.69*     Radiology    No results found.  Cardiac Studies   Echocardiogram  1. Left ventricular ejection fraction, by estimation, is 20 to 25%. The  left ventricle has severely decreased function. The left ventricle  demonstrates global hypokinesis. The left ventricular internal cavity size  was moderately dilated. Left  ventricular diastolic parameters are consistent with Grade II diastolic  dysfunction (pseudonormalization). The average left ventricular global  longitudinal strain is -5.1 %.   2. Right ventricular systolic function is normal. The right ventricular  size is normal. There is mildly elevated pulmonary artery systolic  pressure.   3. The mitral valve is normal in structure. Moderate mitral  valve  regurgitation. No evidence of mitral stenosis.   4. The aortic valve is normal in structure. Aortic valve regurgitation is  moderate. No aortic stenosis is present.   5. The inferior vena cava is normal in size with greater than 50%  respiratory variability, suggesting right atrial pressure of 3 mmHg.   Patient Profile     76 y.o. female with a history of allergic rhinitis, HTN, and HLD who [presented on 8/16 with a 2 week history of cough, chest/sinus congestion, back pain, pleuritic pain/chest wall tenderness, and progressive dyspnea, who was found to have CHF with an EF 20-25%.  Assessment & Plan  Dilated cardiomyopathy/acute systolic CHF Echocardiogram with ejection fraction 25%, moderately dilated LV global hypokinesis Unable to exclude ischemic cardiomyopathy though few risk factors(non-smoker, no significant diabetes) -Would recommend we continue Lasix 40 IV twice daily, stable renal function, still with pleural effusion Tolerating low-dose losartan and metoprolol, blood pressure low limiting further titration -- right and left heart cardiac catheterization on Monday   Acute respiratory failure/right chest wall pain In the setting of cardiomyopathy, severely reduced ejection fraction -Pleural effusion likely contributing to chest wall pain CT scan negative for PE Continue IV Lasix, goal-directed medical therapy for cardiomyopathy   Back pain, chest pain Followed by Dr. Sharlet Salina as outpatient Atypical, likely musculoskeletal in the setting of cough Possible exacerbation from pleural effusion, too small it would appear from imaging for thoracentesis Slow improvement daily, pain improved today On Flexeril, Toradol, morphine as needed    Total encounter time more than 35 minutes  Greater than 50% was spent in counseling and coordination of care with the patient   For questions or updates, please contact [facility] Please consult [email or web address] for contact info under         Signed, Ida Rogue, MD  08/29/2021, 1:12 PM

## 2021-08-29 NOTE — Progress Notes (Signed)
Physical Therapy Treatment Patient Details Name: NADALEE NEISWENDER MRN: [id number] DOB: 09-01-1945 Today's Date: 08/29/2021   History of Present Illness presented to ER secondary to worsening SOB, chest and back pain; admitted for management of acute respiratory disease related to bronchitis.  Chest CT negative for PE; per cardiology consult, chest pain likely demand ischemia related to viral illness (no ACS)    PT Comments    Pt is making good progress towards goals with ability to ambulate entire RN station this date with improved confidence. Pt reports already ambulating earlier this date but felt unsteady and is pleased with her progress. No AD used and all mobility on RA. Left on RA post gait training. Updated recs to OP PT with one more session to progress gait and attempt stair training. Will continue to progress.   Recommendations for follow up therapy are one component of a multi-disciplinary discharge planning process, led by the attending physician.  Recommendations may be updated based on patient status, additional functional criteria and insurance authorization.  Follow Up Recommendations  Outpatient PT     Assistance Recommended at Discharge PRN  Patient can return home with the following A little help with walking and/or transfers;A little help with bathing/dressing/bathroom   Equipment Recommendations  None recommended by PT    Recommendations for Other Services       Precautions / Restrictions Precautions Precautions: Fall Precaution Comments: Mod fall risk Restrictions Weight Bearing Restrictions: No     Mobility  Bed Mobility Overal bed mobility: Independent             General bed mobility comments: safe technique    Transfers Overall transfer level: Independent Equipment used: None               General transfer comment: safe technique with upright posture    Ambulation/Gait Ambulation/Gait assistance: Supervision Gait Distance  (Feet): 200 Feet Assistive device: None Gait Pattern/deviations: Step-through pattern       General Gait Details: cautious and slow gait speed with reciprocal gait pattern. All mobility performed on RA with sats at 99% prior and 96% post exertion. Able to carry conversation with exertion and no LOB, however generalized unsteadiness   Stairs             Wheelchair Mobility    Modified Rankin (Stroke Patients Only)       Balance Overall balance assessment: Needs assistance Sitting-balance support: No upper extremity supported, Feet supported Sitting balance-Leahy Scale: Normal     Standing balance support: During functional activity, No upper extremity supported Standing balance-Leahy Scale: Fair                              Cognition Arousal/Alertness: Awake/alert Behavior During Therapy: WFL for tasks assessed/performed Overall Cognitive Status: Within Functional Limits for tasks assessed                                          Exercises      General Comments        Pertinent Vitals/Pain Pain Assessment Pain Assessment: No/denies pain    Home Living                          Prior Function            PT Goals (  current goals can now be found in the care plan section) Acute Rehab PT Goals Patient Stated Goal: to make this pain better PT Goal Formulation: With patient Time For Goal Achievement: 09/09/21 Potential to Achieve Goals: Good Progress towards PT goals: Progressing toward goals    Frequency    Min 2X/week      PT Plan Discharge plan needs to be updated    Co-evaluation              AM-PAC PT "6 Clicks" Mobility   Outcome Measure  Help needed turning from your back to your side while in a flat bed without using bedrails?: None Help needed moving from lying on your back to sitting on the side of a flat bed without using bedrails?: None Help needed moving to and from a bed to a chair  (including a wheelchair)?: None Help needed standing up from a chair using your arms (e.g., wheelchair or bedside chair)?: None Help needed to walk in hospital room?: None Help needed climbing 3-5 steps with a railing? : A Little 6 Click Score: 23    End of Session   Activity Tolerance: Patient tolerated treatment well Patient left: in bed;with bed alarm set Nurse Communication: Mobility status PT Visit Diagnosis: Difficulty in walking, not elsewhere classified (R26.2)     Time: 1350-1402 PT Time Calculation (min) (ACUTE ONLY): 12 min  Charges:  $Gait Training: 8-22 mins                     Greggory Stallion, PT, DPT, GCS [phone]    Lavon Horn 08/29/2021, 3:15 PM

## 2021-08-29 NOTE — Plan of Care (Signed)

## 2021-08-30 ENCOUNTER — Encounter
Admission: EM | Disposition: A | Discharge status: Home / Self Care | Payer: Self-pay | Source: Home / Self Care | Attending: Internal Medicine

## 2021-08-30 ENCOUNTER — Encounter: Payer: Self-pay | Admitting: Internal Medicine

## 2021-08-30 DIAGNOSIS — I428 Other cardiomyopathies: Secondary | ICD-10-CM

## 2021-08-30 DIAGNOSIS — J9601 Acute respiratory failure with hypoxia: Secondary | ICD-10-CM

## 2021-08-30 DIAGNOSIS — I5021 Acute systolic (congestive) heart failure: Secondary | ICD-10-CM | POA: Diagnosis not present

## 2021-08-30 HISTORY — PX: RIGHT/LEFT HEART CATH AND CORONARY ANGIOGRAPHY: CATH118266

## 2021-08-30 LAB — COMPREHENSIVE METABOLIC PANEL
ALT: 37 U/L (ref 0–44)
AST: 23 U/L (ref 15–41)
Albumin: 3.5 g/dL (ref 3.5–5.0)
Alkaline Phosphatase: 59 U/L (ref 38–126)
Anion gap: 12 (ref 5–15)
BUN: 32 mg/dL — ABNORMAL HIGH (ref 8–23)
CO2: 28 mmol/L (ref 22–32)
Calcium: 8.8 mg/dL — ABNORMAL LOW (ref 8.9–10.3)
Chloride: 94 mmol/L — ABNORMAL LOW (ref 98–111)
Creatinine, Ser: 1.06 mg/dL — ABNORMAL HIGH (ref 0.44–1.00)
GFR, Estimated: 54 mL/min — ABNORMAL LOW (ref >60)
Glucose, Bld: 90 mg/dL (ref 70–99)
Potassium: 4.2 mmol/L (ref 3.5–5.1)
Sodium: 134 mmol/L — ABNORMAL LOW (ref 135–145)
Total Bilirubin: 0.4 mg/dL (ref 0.3–1.2)
Total Protein: 6.5 g/dL (ref 6.5–8.1)

## 2021-08-30 LAB — CBC WITH DIFFERENTIAL/PLATELET
Abs Immature Granulocytes: 0.03 10*3/uL (ref 0.00–0.07)
Basophils Absolute: 0 10*3/uL (ref 0.0–0.1)
Basophils Relative: 0 %
Eosinophils Absolute: 0.1 10*3/uL (ref 0.0–0.5)
Eosinophils Relative: 2 %
HCT: 39.7 % (ref 36.0–46.0)
Hemoglobin: 13.2 g/dL (ref 12.0–15.0)
Immature Granulocytes: 0 %
Lymphocytes Relative: 34 %
Lymphs Abs: 2.4 10*3/uL (ref 0.7–4.0)
MCH: 30.3 pg (ref 26.0–34.0)
MCHC: 33.2 g/dL (ref 30.0–36.0)
MCV: 91.1 fL (ref 80.0–100.0)
Monocytes Absolute: 0.6 10*3/uL (ref 0.1–1.0)
Monocytes Relative: 8 %
Neutro Abs: 4 10*3/uL (ref 1.7–7.7)
Neutrophils Relative %: 56 %
Platelets: 231 10*3/uL (ref 150–400)
RBC: 4.36 MIL/uL (ref 3.87–5.11)
RDW: 13.1 % (ref 11.5–15.5)
Smear Review: NORMAL
WBC: 7.1 10*3/uL (ref 4.0–10.5)
nRBC: 0 % (ref 0.0–0.2)

## 2021-08-30 LAB — FERRITIN: Ferritin: 24 ng/mL (ref 11–307)

## 2021-08-30 LAB — MAGNESIUM: Magnesium: 2.3 mg/dL (ref 1.7–2.4)

## 2021-08-30 LAB — PHOSPHORUS: Phosphorus: 5.5 mg/dL — ABNORMAL HIGH (ref 2.5–4.6)

## 2021-08-30 SURGERY — RIGHT/LEFT HEART CATH AND CORONARY ANGIOGRAPHY
Anesthesia: Moderate Sedation

## 2021-08-30 MED ORDER — MIDAZOLAM HCL 2 MG/2ML IJ SOLN
INTRAMUSCULAR | Status: DC | PRN
  Administered 2021-08-30: .5 mg via INTRAVENOUS

## 2021-08-30 MED ORDER — HEPARIN (PORCINE) IN NACL 1000-0.9 UT/500ML-% IV SOLN
INTRAVENOUS | Status: AC
  Filled 2021-08-30: qty 1000

## 2021-08-30 MED ORDER — IOHEXOL 300 MG/ML  SOLN
INTRAMUSCULAR | Status: DC | PRN
  Administered 2021-08-30: 32 mL

## 2021-08-30 MED ORDER — SPIRONOLACTONE 25 MG PO TABS
12.5000 mg | ORAL_TABLET | Freq: Every day | ORAL | Status: DC
  Administered 2021-08-30 – 2021-08-31 (×2): 12.5 mg via ORAL
  Filled 2021-08-30: qty 0.5
  Filled 2021-08-30: qty 1
  Filled 2021-08-30: qty 0.5
  Filled 2021-08-30: qty 1

## 2021-08-30 MED ORDER — HEPARIN (PORCINE) IN NACL 1000-0.9 UT/500ML-% IV SOLN
INTRAVENOUS | Status: DC | PRN
  Administered 2021-08-30: 1000 mL

## 2021-08-30 MED ORDER — FUROSEMIDE 40 MG PO TABS
40.0000 mg | ORAL_TABLET | Freq: Every day | ORAL | Status: DC
  Administered 2021-08-31: 40 mg via ORAL
  Filled 2021-08-30: qty 1

## 2021-08-30 MED ORDER — VERAPAMIL HCL 2.5 MG/ML IV SOLN
INTRAVENOUS | Status: DC | PRN
  Administered 2021-08-30: 2.5 mg via INTRA_ARTERIAL

## 2021-08-30 MED ORDER — VERAPAMIL HCL 2.5 MG/ML IV SOLN
INTRAVENOUS | Status: AC
  Filled 2021-08-30: qty 2

## 2021-08-30 MED ORDER — HEPARIN SODIUM (PORCINE) 1000 UNIT/ML IJ SOLN
INTRAMUSCULAR | Status: AC
  Filled 2021-08-30: qty 10

## 2021-08-30 MED ORDER — HEPARIN SODIUM (PORCINE) 1000 UNIT/ML IJ SOLN
INTRAMUSCULAR | Status: DC | PRN
  Administered 2021-08-30: 3000 [IU] via INTRAVENOUS

## 2021-08-30 MED ORDER — MIDAZOLAM HCL 2 MG/2ML IJ SOLN
INTRAMUSCULAR | Status: AC
  Filled 2021-08-30: qty 2

## 2021-08-30 MED ORDER — LIDOCAINE HCL (PF) 1 % IJ SOLN
INTRAMUSCULAR | Status: DC | PRN
  Administered 2021-08-30 (×3): 2 mL

## 2021-08-30 MED ORDER — FENTANYL CITRATE (PF) 100 MCG/2ML IJ SOLN
INTRAMUSCULAR | Status: DC | PRN
  Administered 2021-08-30: 12.5 ug via INTRAVENOUS

## 2021-08-30 MED ORDER — LIDOCAINE HCL 1 % IJ SOLN
INTRAMUSCULAR | Status: AC
  Filled 2021-08-30: qty 20

## 2021-08-30 MED ORDER — FENTANYL CITRATE (PF) 100 MCG/2ML IJ SOLN
INTRAMUSCULAR | Status: AC
  Filled 2021-08-30: qty 2

## 2021-08-30 SURGICAL SUPPLY — 14 items
BAND ZEPHYR COMPRESS 30 LONG (HEMOSTASIS) IMPLANT
CATH 5F 110X4 TIG (CATHETERS) IMPLANT
CATH BALLN WEDGE 5F 110CM (CATHETERS) IMPLANT
CATH INFINITI 5FR ANG PIGTAIL (CATHETERS) IMPLANT
DRAPE BRACHIAL (DRAPES) IMPLANT
GLIDESHEATH SLEND SS 6F .021 (SHEATH) IMPLANT
GUIDEWIRE INQWIRE 1.5J.035X260 (WIRE) IMPLANT
INQWIRE 1.5J .035X260CM (WIRE) ×1
PACK CARDIAC CATH (CUSTOM PROCEDURE TRAY) ×1 IMPLANT
PAD ELECT DEFIB RADIOL ZOLL (MISCELLANEOUS) IMPLANT
PROTECTION STATION PRESSURIZED (MISCELLANEOUS) ×1
SET ATX SIMPLICITY (MISCELLANEOUS) IMPLANT
SHEATH GLIDE SLENDER 4/5FR (SHEATH) IMPLANT
STATION PROTECTION PRESSURIZED (MISCELLANEOUS) IMPLANT

## 2021-08-30 NOTE — Care Management Important Message (Signed)
Important Message  Patient Details  Name: Glenda May MRN: [id number] Date of Birth: [birth year]   Medicare Important Message Given:  N/A - LOS <3 / Initial given by admissions     Dannette Barbara 08/30/2021, 8:20 AM

## 2021-08-30 NOTE — Plan of Care (Signed)

## 2021-08-30 NOTE — Progress Notes (Signed)
Occupational Therapy Treatment Patient Details Name: Glenda May MRN: [id number] DOB: 1945-09-29 Today's Date: 08/30/2021   History of present illness presented to ER secondary to worsening SOB, chest and back pain; admitted for management of acute respiratory disease related to bronchitis.  Chest CT negative for PE; per cardiology consult, chest pain likely demand ischemia related to viral illness (no ACS)   OT comments  Glenda May did very well today, was able to perform ADL tasks (grooming, toileting, dressing) w/ Mod I. Ambulated ~500' without AD, no SOB, a few episodes of LOB w/ pt able to self-correct. Encouraged pt to walk a bit more slowly, no further LOB with slower ambulation. Provided educ re: self-care post DC. Pt verbalizes understanding, able to provide teach-back. Pt and therapist in agreement that no further OT services are required at this time. Pt appears to be IND in fxl mobility, back to baseline.    Recommendations for follow up therapy are one component of a multi-disciplinary discharge planning process, led by the attending physician.  Recommendations may be updated based on patient status, additional functional criteria and insurance authorization.    Follow Up Recommendations  No OT follow up    Assistance Recommended at Discharge PRN  Patient can return home with the following      Equipment Recommendations  None recommended by OT    Recommendations for Other Services      Precautions / Restrictions Precautions Precautions: Fall Precaution Comments: Mod fall risk Restrictions Weight Bearing Restrictions: No       Mobility Bed Mobility Overal bed mobility: Independent             General bed mobility comments: safe technique    Transfers Overall transfer level: Independent                 General transfer comment: safe technique with upright posture     Balance Overall balance assessment: Needs assistance Sitting-balance  support: No upper extremity supported, Feet supported Sitting balance-Leahy Scale: Normal     Standing balance support: During functional activity, No upper extremity supported Standing balance-Leahy Scale: Fair Standing balance comment: few small LOB with ambulation, pt able to self-correct                           ADL either performed or assessed with clinical judgement   ADL           Upper Body Bathing: Modified independent                                  Extremity/Trunk Assessment Upper Extremity Assessment Upper Extremity Assessment: Overall WFL for tasks assessed   Lower Extremity Assessment Lower Extremity Assessment: Overall WFL for tasks assessed   Cervical / Trunk Assessment Cervical / Trunk Assessment: Normal    Vision       Perception     Praxis      Cognition Arousal/Alertness: Awake/alert Behavior During Therapy: WFL for tasks assessed/performed Overall Cognitive Status: Within Functional Limits for tasks assessed                                 General Comments: pleasant and engaged        Exercises Other Exercises Other Exercises: Educ re: ECS, heart health, rehab process, balance    Shoulder Instructions  General Comments      Pertinent Vitals/ Pain       Pain Assessment Pain Assessment: No/denies pain  Home Living                                          Prior Functioning/Environment              Frequency           Progress Toward Goals  OT Goals(current goals can now be found in the care plan section)  Progress towards OT goals: Progressing toward goals  Acute Rehab OT Goals OT Goal Formulation: With patient Time For Goal Achievement: 09/09/21 Potential to Achieve Goals: Good  Plan Discharge plan remains appropriate;Frequency needs to be updated    Co-evaluation                 AM-PAC OT "6 Clicks" Daily Activity     Outcome  Measure   Help from another person eating meals?: None Help from another person taking care of personal grooming?: None Help from another person toileting, which includes using toliet, bedpan, or urinal?: None Help from another person bathing (including washing, rinsing, drying)?: None Help from another person to put on and taking off regular upper body clothing?: None Help from another person to put on and taking off regular lower body clothing?: None 6 Click Score: 24    End of Session    OT Visit Diagnosis: Muscle weakness (generalized) (M62.81);Unsteadiness on feet (R26.81)   Activity Tolerance Patient tolerated treatment well   Patient Left in bed;with call bell/phone within reach;with family/visitor present   Nurse Communication          Time: 1443-1540 OT Time Calculation (min): 20 min  Charges: OT General Charges $OT Visit: 1 Visit OT Treatments $Self Care/Home Management : 8-22 mins  Josiah Lobo, PhD, MS, OTR/L 08/30/21, 4:19 PM

## 2021-08-30 NOTE — Progress Notes (Addendum)
Brief Education Note  RD received consult for diet education for new CHF.   Noted pt was already educated on 08/27/21; see RD note on this date for further details..   Secure chat message sent to MD. MD called RD and explained that pt's niece from out of town, who is involved in pt care, is requesting education and handouts.   Spoke with pt, son, and niece at bedside. Pt niece has detailed notes and had multiple questions about appropriate seasonings and low sodium foods, which this RD answered. She is also requesting additional educational handouts.   RD provided "Low Sodium Nutrition Therapy" handout from the [employer]. Reviewed patient's dietary recall. Provided examples on ways to decrease sodium intake in diet. Discouraged intake of processed foods and use of salt shaker. Encouraged fresh fruits and vegetables as well as whole grain sources of carbohydrates to maximize fiber intake.   RD discussed why it is important for patient to adhere to diet recommendations, and emphasized the role of fluids, foods to avoid, and importance of weighing self daily. Teach back method used.  Expect fair to good compliance.  Glenda May, RD, LDN, [place] Registered Dietitian II Certified Diabetes Care and Education Specialist Please refer to [facility] for RD and/or RD on-call/weekend/after hours pager

## 2021-08-30 NOTE — Progress Notes (Signed)
Progress Note  Patient Name: Glenda May Date of Encounter: 08/30/2021  [facility] Cardiologist: Ida Rogue, MD   Subjective   Breathing improved.  Glenda May was able to walk to the bathroom this morning without significant dyspnea.  She continues to have some right flank pain.  Her family is concerned that right flank pain is due to small pleural effusion noted on CTA performed earlier this admission.  Inpatient Medications    Scheduled Meds:  [MAR Hold] aspirin EC  81 mg Oral Daily   [MAR Hold] dapagliflozin propanediol  10 mg Oral Daily   [START ON 08/31/2021] furosemide  40 mg Oral Daily   [MAR Hold] losartan  12.5 mg Oral Daily   [MAR Hold] metoprolol succinate  12.5 mg Oral Daily   [MAR Hold] rosuvastatin  20 mg Oral Daily   [MAR Hold] sodium chloride flush  3 mL Intravenous Q12H   spironolactone  12.5 mg Oral Daily   Continuous Infusions:  PRN Meds: [MAR Hold] acetaminophen, [MAR Hold] cyclobenzaprine, [MAR Hold] ipratropium-albuterol, [MAR Hold] LORazepam, [MAR Hold]  morphine injection, [MAR Hold] nitroGLYCERIN, [MAR Hold] ondansetron (ZOFRAN) IV   Vital Signs    Vitals:   08/29/21 2032 08/30/21 0023 08/30/21 0810 08/30/21 1000  BP: (!) 103/54 (!) 92/51 99/72   Pulse: 91 93 93 94  Resp: '16 18 14 18  '$ Temp: 97.8 F (36.6 C) 98.6 F (37 C) 97.6 F (36.4 C)   TempSrc: Oral Oral Oral   SpO2: 100% 99% 97% 95%  Weight:   56 kg   Height:   '5\' 2"'$  (1.575 m)     Intake/Output Summary (Last 24 hours) at 08/30/2021 1126 Last data filed at 08/30/2021 0417 Gross per 24 hour  Intake 240 ml  Output 900 ml  Net -660 ml      08/30/2021    8:10 AM 08/29/2021    7:35 PM 08/25/2021    4:17 PM  Last 3 Weights  Weight (lbs) 123 lb 7.3 oz 123 lb 7.3 oz 125 lb  Weight (kg) 56 kg 56 kg 56.7 kg      Telemetry    Sinus rhythm with 15 beat run of SVT with aberrancy - Personally Reviewed  ECG   No new tracing.  Physical Exam   GEN: No acute distress.    Neck: No JVD Cardiac: RRR, no murmurs, rubs, or gallops.  Respiratory: Clear to auscultation bilaterally. GI: Soft, nontender, non-distended  MS: No edema; No deformity. Neuro:  Nonfocal  Psych: Normal affect   Labs    High Sensitivity Troponin:   Recent Labs  Lab 08/26/21 0336 08/26/21 1734 08/28/21 1323 08/28/21 1448 08/28/21 1810  TROPONINIHS 33* 31* 20* 19* 19*     Chemistry Recent Labs  Lab 08/28/21 0603 08/28/21 1323 08/29/21 0458 08/30/21 0509  NA 134*  --  134* 134*  K 3.3* 5.0 4.7 4.2  CL 99  --  99 94*  CO2 28  --  28 28  GLUCOSE 95  --  110* 90  BUN 17  --  20 32*  CREATININE 0.74  --  0.83 1.06*  CALCIUM 8.6*  --  9.0 8.8*  MG 2.2 2.4 2.4 2.3  PROT 6.5  --  6.9 6.5  ALBUMIN 3.7  --  3.8 3.5  AST 26  --  25 23  ALT 33  --  43 37  ALKPHOS 60  --  62 59  BILITOT 0.4  --  0.7 0.4  GFRNONAA >  60  --  >60 54*  ANIONGAP 7  --  7 12    Lipids  Recent Labs  Lab 08/28/21 0603  CHOL 183  TRIG 151*  HDL 56  LDLCALC 97  CHOLHDL 3.3    Hematology Recent Labs  Lab 08/28/21 0603 08/29/21 0458 08/30/21 0509  WBC 7.4 8.1 7.1  RBC 3.84* 4.17 4.36  HGB 11.6* 12.4 13.2  HCT 34.1* 37.8 39.7  MCV 88.8 90.6 91.1  MCH 30.2 29.7 30.3  MCHC 34.0 32.8 33.2  RDW 13.1 13.2 13.1  PLT 219 233 231   Thyroid  Recent Labs  Lab 08/25/21 1638  TSH 1.052    BNP Recent Labs  Lab 08/26/21 0140  BNP 2,082.6*    DDimer  Recent Labs  Lab 08/26/21 0336  DDIMER 0.69*     Radiology    CARDIAC CATHETERIZATION  Result Date: 08/30/2021 Conclusions: Mild, nonobstructive coronary artery disease with 10-20% proximal LAD and 20% ostial RCA stenoses.  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 Normal left heart, right heart, and pulmonary artery pressures. Normal Fick cardiac output/index. Recommendations: Defer further diuresis today.  Restart oral furosemide tomorrow. Add spironolactone to optimize goal-directed medical therapy.  Continue with  escalation of goal-directed medical therapy as blood pressure and renal function tolerate. Nelva Bush, MD [facility] [place] [facility]   Cardiac Studies   See cardiac catheterization results above.  TTE (08/26/2021):  1. Left ventricular ejection fraction, by estimation, is 20 to 25%. The  left ventricle has severely decreased function. The left ventricle  demonstrates global hypokinesis. The left ventricular internal cavity size  was moderately dilated. Left  ventricular diastolic parameters are consistent with Grade II diastolic  dysfunction (pseudonormalization). The average left ventricular global  longitudinal strain is -5.1 %.   2. Right ventricular systolic function is normal. The right ventricular  size is normal. There is mildly elevated pulmonary artery systolic  pressure.   3. The mitral valve is normal in structure. Moderate mitral valve  regurgitation. No evidence of mitral stenosis.   4. The aortic valve is normal in structure. Aortic valve regurgitation is  moderate. No aortic stenosis is present.   5. The inferior vena cava is normal in size with greater than 50%  respiratory variability, suggesting right atrial pressure of 3 mmHg.   Patient Profile     76 y.o. female with history of hypertension, hyperlipidemia, and allergic rhinitis, admitted with 2-week history of cough, chest/sinus congestion, back pain, and pleuritic chest pain accompanied by progressive dyspnea.  She was found to have severely reduced LVEF consistent with acute HFrEF.  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  Glenda May appears euvolemic by physical exam and right heart catheterization numbers today.  Blood pressure remains soft, limiting escalation of goal-directed medical therapy.  She has a history of angioedema to ACE inhibitors but is tolerating low-dose losartan well thus far. -Continue metoprolol succinate 12.5 mg daily and losartan 12.5 mg  daily.  I would be reluctant to challenge her with Delene Loll given history of angioedema with ACE inhibitors. -Start spironolactone 12.5 mg daily. -Diuretic holiday today with plans to resume furosemide 40 mg p.o. daily tomorrow if renal function is stable. -Check ferritin; TSH normal earlier this admission. -Consider cardiac MRI once breathing improves (could be done this admission or as an outpatient). -May benefit from CRT in the future if LVEF does not improve significantly with GDMT, given underlying LBBB.  Chest wall/flank pain: Glenda May  continues to complain of some flank pain.  This is most likely musculoskeletal though a component of pleurisy cannot be excluded.  Potential for thoracentesis has been mentioned by other providers, though right pleural effusion was relatively small on CTA. -Defer further evaluation for thoracentesis to Dr. Posey Pronto. -Continue symptomatic treatment of right flank pain.  Acute respiratory failure with hypoxia: Patient remains somewhat hypoxic.  Some compressive atelectasis from pleural effusions could be contributing.  Right heart catheterization does not suggest that pulmonary edema is driving persistent hypoxia. -Continue supplemental oxygen as needed. -Potential thoracentesis, as detailed above.   For questions or updates, please contact [place] Please consult [email or web address] for contact info under [facility].     Signed, Nelva Bush, MD  08/30/2021, 11:26 AM

## 2021-08-30 NOTE — Progress Notes (Signed)
PT Cancellation Note  Patient Details Name: Glenda May MRN: [id number] DOB: 03/08/1945   Cancelled Treatment:    Reason Eval/Treat Not Completed: Other (comment). Pt currently off unit for heart cath. Will re-attempt therapy at another time.   Breasia Karges 08/30/2021, 9:05 AM Greggory Stallion, PT, DPT, [facility] [phone]

## 2021-08-30 NOTE — Interval H&P Note (Signed)
History and Physical Interval Note:  08/30/2021 8:46 AM  Glenda May  has presented today for surgery, with the diagnosis of acute HFrEF and cardiomyopathy.  The various methods of treatment have been discussed with the patient and family. After consideration of risks, benefits and other options for treatment, the patient has consented to  Procedure(s): RIGHT/LEFT HEART CATH AND CORONARY ANGIOGRAPHY (N/A) as a surgical intervention.  The patient's history has been reviewed, patient examined, no change in status, stable for surgery.  I have reviewed the patient's chart and labs.  Questions were answered to the patient's satisfaction.    Cath Lab Visit (complete for each Cath Lab visit)  Clinical Evaluation Leading to the Procedure:   ACS: No.  Non-ACS:    Anginal/Heart Failure Classification: NYHA class IV  Anti-ischemic medical therapy: Minimal Therapy (1 class of medications)  Non-Invasive Test Results: No non-invasive testing performed (LVEF 20-25% by echo -> high risk)  Prior CABG: No previous CABG        Octaviano Mukai

## 2021-08-30 NOTE — Progress Notes (Signed)
[facility] NAME: Glenda May    MR#:  [id number]  DATE OF BIRTH:  [birth year]  SUBJECTIVE:  patient's niece at bedside. Came from cardiac catheterization. Denies chest pain. Has some right lower pain in the back. Shortness of breath improved.     VITALS:  Blood pressure (!) 92/55, pulse 91, temperature (!) 97.5 F (36.4 C), resp. rate 17, height '5\' 2"'$  (1.575 m), weight 56 kg, SpO2 100 %.  PHYSICAL EXAMINATION:   GENERAL:  76 y.o.-year-old patient lying in the bed with no acute distress.  LUNGS: Normal breath sounds bilaterally, no wheezing, rales, rhonchi.  CARDIOVASCULAR: S1, S2 normal. No murmurs, rubs, or gallops.  ABDOMEN: Soft, nontender, nondistended. Bowel sounds present.  EXTREMITIES: No  edema b/l.    NEUROLOGIC: nonfocal  patient is alert and awake SKIN: No obvious rash, lesion, or ulcer.   LABORATORY PANEL:  CBC Recent Labs  Lab 08/30/21 0509  WBC 7.1  HGB 13.2  HCT 39.7  PLT 231    Chemistries  Recent Labs  Lab 08/30/21 0509  NA 134*  K 4.2  CL 94*  CO2 28  GLUCOSE 90  BUN 32*  CREATININE 1.06*  CALCIUM 8.8*  MG 2.3  AST 23  ALT 37  ALKPHOS 59  BILITOT 0.4   Cardiac Enzymes No results for input(s): "TROPONINI" in the last 168 hours. RADIOLOGY:  CARDIAC CATHETERIZATION  Result Date: 08/30/2021 Conclusions: Mild, nonobstructive coronary artery disease with 10-20% proximal LAD and 20% ostial RCA stenoses.  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 Normal left heart, right heart, and pulmonary artery pressures. Normal Fick cardiac output/index. Recommendations: Defer further diuresis today.  Restart oral furosemide tomorrow. Add spironolactone to optimize goal-directed medical therapy.  Continue with escalation of goal-directed medical therapy as blood pressure and renal function tolerate. Nelva Bush, MD [facility]   Assessment and Plan  Glenda May is a 76  y.o. female with medical history significant for Hypertension and seasonal allergies who presents to the ED for evaluation of chest pain.  Patient was being treated as an outpatient for possible bronchitis after complaining of sinus congestion, postnasal drip and typical allergy symptoms for the past 2 weeks.  Over the course of the last few days she developed right-sided chest pain of severe intensity radiating from the mid right back to the anterior chest, worse on coughing.  Acute congestive heart failure systolic (EF 02-72% by echo) non-ischemic cardiomyopathy -- patient came in with chest tightness and increasing shortness of breath -- she was started on IV Lasix for diuresis -- holding Lasix today per cardiology recommendation since creatinine slightly up -- seen by [facility] cardiology-- status post cardiac cath by Dr. Saunders Revel  Mild, nonobstructive coronary artery disease with 10-20% proximal LAD and 20% ostial RCA stenoses.  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 Normal left heart, right heart, and pulmonary artery pressures. Normal Fick cardiac output/index. Recommendations: Defer further diuresis today.  Restart oral furosemide tomorrow. Add spironolactone to optimize goal-directed medical therapy.  Continue with escalation of goal-directed medical therapy as blood pressure and renal function tolerate. -- Continue losartan, metoprolol, Spironolactone,farxiga  Right-sided pleural effusion as noted on CT chest -- ultrasound-guided thoracentesis if adequate fluid  Hypertension -- meds as above  History of acute bronchitis-- recently completed treatment with Z pack and prednisone -- Pro calcitonin less than .10 -- patient afebrile -- white count normal  Multiple pulmonary nodules as noted on CT scan --  patient will follow-up at six months and repeat CT chest. Defer to PCP Dr. Sabra Heck   Procedures: cardiac cath Family communication : niece Consults : cardiology CODE  STATUS: full DVT Prophylaxis :SCD Level of care: Telemetry Cardiac Status is: Inpatient Remains inpatient appropriate because: ongoing treatment for acute systolic CHF. Ultrasound-guided thoracentesis pending.  Anticipate discharge 1 to 2 days    TOTAL TIME TAKING CARE OF THIS PATIENT: 35 minutes.  >50% time spent on counselling and coordination of care  Note: This dictation was prepared with Dragon dictation along with smaller phrase technology. Any transcriptional errors that result from this process are unintentional.  Fritzi Mandes M.D    [facility]   CC: Primary care physician; [name], MD

## 2021-08-31 ENCOUNTER — Telehealth: Payer: Self-pay | Admitting: Cardiovascular Disease

## 2021-08-31 ENCOUNTER — Inpatient Hospital Stay: Payer: Medicare Other

## 2021-08-31 DIAGNOSIS — I5021 Acute systolic (congestive) heart failure: Secondary | ICD-10-CM | POA: Diagnosis not present

## 2021-08-31 DIAGNOSIS — I428 Other cardiomyopathies: Secondary | ICD-10-CM | POA: Diagnosis not present

## 2021-08-31 LAB — BASIC METABOLIC PANEL
Anion gap: 8 (ref 5–15)
BUN: 19 mg/dL (ref 8–23)
CO2: 25 mmol/L (ref 22–32)
Calcium: 8.7 mg/dL — ABNORMAL LOW (ref 8.9–10.3)
Chloride: 100 mmol/L (ref 98–111)
Creatinine, Ser: 0.69 mg/dL (ref 0.44–1.00)
GFR, Estimated: >60 mL/min (ref >60)
Glucose, Bld: 108 mg/dL — ABNORMAL HIGH (ref 70–99)
Potassium: 4.1 mmol/L (ref 3.5–5.1)
Sodium: 133 mmol/L — ABNORMAL LOW (ref 135–145)

## 2021-08-31 LAB — GRAM STAIN

## 2021-08-31 LAB — GLUCOSE, PLEURAL OR PERITONEAL FLUID: Glucose, Fluid: 119 mg/dL

## 2021-08-31 LAB — LACTATE DEHYDROGENASE, PLEURAL OR PERITONEAL FLUID: LD, Fluid: 140 U/L — ABNORMAL HIGH (ref 3–23)

## 2021-08-31 LAB — PROTEIN, PLEURAL OR PERITONEAL FLUID: Total protein, fluid: 3.6 g/dL

## 2021-08-31 MED ORDER — NITROGLYCERIN 0.4 MG SL SUBL: 0.4000 mg | SUBLINGUAL_TABLET | SUBLINGUAL | 12 refills | Status: DC | PRN

## 2021-08-31 MED ORDER — SPIRONOLACTONE 25 MG PO TABS: 12.5000 mg | ORAL_TABLET | Freq: Every day | ORAL | 3 refills | Status: AC

## 2021-08-31 MED ORDER — METOPROLOL SUCCINATE ER 25 MG PO TB24: 12.5000 mg | ORAL_TABLET | Freq: Every day | ORAL | 3 refills | Status: DC

## 2021-08-31 MED ORDER — FUROSEMIDE 40 MG PO TABS: 40.0000 mg | ORAL_TABLET | Freq: Every day | ORAL | 3 refills | Status: DC

## 2021-08-31 MED ORDER — ASPIRIN 81 MG PO TBEC: 81.0000 mg | DELAYED_RELEASE_TABLET | Freq: Every day | ORAL | 12 refills | Status: AC

## 2021-08-31 MED ORDER — DAPAGLIFLOZIN PROPANEDIOL 10 MG PO TABS: 10.0000 mg | ORAL_TABLET | Freq: Every day | ORAL | 2 refills | Status: AC

## 2021-08-31 NOTE — Procedures (Signed)
PROCEDURE SUMMARY:  Successful US guided diagnostic and therapeutic right thoracentesis. Yielded 350 cc of clear, yellow fluid. Pt tolerated procedure well. No immediate complications.  Specimen was sent for labs. CXR ordered.  EBL < 1 mL  Tyson Alias, AGNP 08/31/2021 11:23 AM

## 2021-08-31 NOTE — TOC Progression Note (Addendum)
Transition of Care [facility]) - Progression Note    Patient Details  Name: Glenda May MRN: [id number] Date of Birth: Jan 27, 1945  Transition of Care [facility]) CM/SW [place], LCSW Phone Number: 08/31/2021, 12:35 PM  Clinical Narrative:  Patient and niece are requesting a cardiac rehab referral. Sent secure email to Patrice Paradise with referral information. Gave patient and niece information on setting up Med Alert devices. Niece or son will transport her home at discharge.  2:04 pm: Cardiac rehab will require MD to send referral. Attending told patient to discuss referral with Dr. Rockey Situ. Niece is aware.  Expected Discharge Plan: Home/Self Care Barriers to Discharge: Continued Medical Work up  Expected Discharge Plan and Services Expected Discharge Plan: Home/Self Care   Discharge Planning Services: CM Consult   Living arrangements for the past 2 months: Single Family Home                                       Social Determinants of Health (SDOH) Interventions    Readmission Risk Interventions     No data to display

## 2021-08-31 NOTE — Progress Notes (Signed)
   Heart Failure Nurse Navigator Note  Met with patient who just returned from thoracentesis that removed 350 mL of fluid.  Also present at the bedside was her [place].  Teach back method went over what changes in weight to report, along with changes in signs and symptoms.  Did not need any reinforcement.  Asked RN to get standing weight on patient which she obtained weight was 121.7 pounds.  Answered questions on continuing with low-sodium diet and eating in restaurants.  Also discussed follow-up in the outpatient heart failure clinic, reminded that she cannot call with questions or concerns until after she is established with Darylene Price in the heart failure clinic.  They voiced understanding.  They had no further questions.  Pricilla Riffle RN CHFN

## 2021-08-31 NOTE — TOC Transition Note (Signed)
Transition of Care [facility]) - CM/SW Discharge Note   Patient Details  Name: Glenda May MRN: [id number] Date of Birth: [birth year]  Transition of Care [facility]) CM/SW Contact:  Candie Chroman, LCSW Phone Number: 08/31/2021, 2:33 PM   Clinical Narrative:   Patient has orders to discharge home today. No further concerns. CSW signing off.  Final next level of care: OP Rehab Barriers to Discharge: Barriers Resolved   Patient Goals and CMS Choice     Choice offered to / list presented to : Patient  Discharge Placement                    Patient and family notified of of transfer: 08/31/21  Discharge Plan and Services   Discharge Planning Services: CM Consult                                 Social Determinants of Health (SDOH) Interventions     Readmission Risk Interventions     No data to display

## 2021-08-31 NOTE — Progress Notes (Signed)
Cardiology Progress Note   Patient Name: Glenda May Date of Encounter: 08/31/2021  Primary Cardiologist: Ida Rogue, MD  Subjective   Feels well this AM. No chest pain or dyspnea.  Pleuritic pain has resolved.  Niece @ bedside.  Questions answered.  Inpatient Medications    Scheduled Meds:  aspirin EC  81 mg Oral Daily   dapagliflozin propanediol  10 mg Oral Daily   furosemide  40 mg Oral Daily   metoprolol succinate  12.5 mg Oral Daily   rosuvastatin  20 mg Oral Daily   sodium chloride flush  3 mL Intravenous Q12H   spironolactone  12.5 mg Oral Daily   Continuous Infusions:  PRN Meds: acetaminophen, cyclobenzaprine, ipratropium-albuterol, morphine injection, nitroGLYCERIN, ondansetron (ZOFRAN) IV   Vital Signs    Vitals:   08/30/21 1929 08/30/21 2347 08/31/21 0323 08/31/21 0752  BP: 105/62 (!) 101/49 97/71 103/65  Pulse: 91 84 96 86  Resp: '18 17 16 16  '$ Temp: 98.6 F (37 C) 98.4 F (36.9 C) 98.2 F (36.8 C) 98.5 F (36.9 C)  TempSrc: Oral  Oral   SpO2: 97% 96% 99% 100%  Weight:   57.1 kg   Height:        Intake/Output Summary (Last 24 hours) at 08/31/2021 0820 Last data filed at 08/31/2021 0327 Gross per 24 hour  Intake --  Output 900 ml  Net -900 ml   Filed Weights   08/29/21 1935 08/30/21 0810 08/31/21 0323  Weight: 56 kg 56 kg 57.1 kg    Physical Exam   GEN: Thin, somewhat frail, in no acute distress.  HEENT: Grossly normal.  Neck: Supple, no JVD, carotid bruits, or masses. Cardiac: RRR, no murmurs, rubs, or gallops. No clubbing, cyanosis, edema.  Radials 2+, DP/PT 2+ and equal bilaterally.  R radial and brachial cath sites w/o bleeding/bruit/hematoma. Respiratory:  Respirations regular and unlabored, clear to auscultation bilaterally. GI: Soft, nontender, nondistended, BS + x 4. MS: no deformity or atrophy. Skin: warm and dry, no rash. Neuro:  Strength and sensation are intact. Psych: AAOx3.  Normal affect.  Labs     Chemistry Recent Labs  Lab 08/28/21 0603 08/28/21 1323 08/29/21 0458 08/30/21 0509 08/31/21 0546  NA 134*  --  134* 134* 133*  K 3.3*   < > 4.7 4.2 4.1  CL 99  --  99 94* 100  CO2 28  --  '28 28 25  '$ GLUCOSE 95  --  110* 90 108*  BUN 17  --  20 32* 19  CREATININE 0.74  --  0.83 1.06* 0.69  CALCIUM 8.6*  --  9.0 8.8* 8.7*  PROT 6.5  --  6.9 6.5  --   ALBUMIN 3.7  --  3.8 3.5  --   AST 26  --  25 23  --   ALT 33  --  43 37  --   ALKPHOS 60  --  62 59  --   BILITOT 0.4  --  0.7 0.4  --   GFRNONAA >60  --  >60 54* >60  ANIONGAP 7  --  '7 12 8   '$ < > = values in this interval not displayed.     Hematology Recent Labs  Lab 08/28/21 0603 08/29/21 0458 08/30/21 0509  WBC 7.4 8.1 7.1  RBC 3.84* 4.17 4.36  HGB 11.6* 12.4 13.2  HCT 34.1* 37.8 39.7  MCV 88.8 90.6 91.1  MCH 30.2 29.7 30.3  MCHC 34.0 32.8 33.2  RDW  13.1 13.2 13.1  PLT 219 233 231    Cardiac Enzymes  Recent Labs  Lab 08/26/21 0336 08/26/21 1734 08/28/21 1323 08/28/21 1448 08/28/21 1810  TROPONINIHS 33* 31* 20* 19* 19*      BNP    Component Value Date/Time   BNP 2,082.6 (H) 08/26/2021 0140   DDimer  Recent Labs  Lab 08/26/21 0336  DDIMER 0.69*     Lipids  Lab Results  Component Value Date   CHOL 183 08/28/2021   HDL 56 08/28/2021   LDLCALC 97 08/28/2021   TRIG 151 (H) 08/28/2021   CHOLHDL 3.3 08/28/2021    HbA1c  Lab Results  Component Value Date   HGBA1C 5.9 (H) 08/28/2021    Radiology   -------------   Telemetry    RSR to sinus tachycardia, 80's to low 100's - Personally Reviewed  Cardiac Studies   2D Echocardiogram 8.17.2023  1. Left ventricular ejection fraction, by estimation, is 20 to 25%. The  left ventricle has severely decreased function. The left ventricle  demonstrates global hypokinesis. The left ventricular internal cavity size  was moderately dilated. Left  ventricular diastolic parameters are consistent with Grade II diastolic  dysfunction  (pseudonormalization). The average left ventricular global  longitudinal strain is -5.1 %.   2. Right ventricular systolic function is normal. The right ventricular  size is normal. There is mildly elevated pulmonary artery systolic  pressure.   3. The mitral valve is normal in structure. Moderate mitral valve  regurgitation. No evidence of mitral stenosis.   4. The aortic valve is normal in structure. Aortic valve regurgitation is  moderate. No aortic stenosis is present.   5. The inferior vena cava is normal in size with greater than 50%  respiratory variability, suggesting right atrial pressure of 3 mmHg.  _____________   Cardiac Catheterization  8.21.2023  Left Main  Vessel is large. Vessel is angiographically normal.  Left Anterior Descending  Vessel is large.  Prox LAD lesion is 15% stenosed. The lesion is focal.  First Diagonal Branch  Vessel is small in size.  Second Diagonal Branch  Vessel is moderate in size.  Third Diagonal Branch  Vessel is small in size.  Left Circumflex  Vessel is moderate in size. Vessel is angiographically normal. The vessel is severely tortuous.  First Obtuse Marginal Branch  Vessel is moderate in size.  Second Obtuse Marginal Branch  Vessel is small in size.  Third Obtuse Marginal Branch  Vessel is small in size.  Right Coronary Artery  Vessel is small.  Ost RCA lesion is 20% stenosed. Question vasospasm.  Right Posterior Descending Artery  Vessel is small in size.  Right Posterior Atrioventricular Artery  Vessel is small in size.   Right Heart Pressures RA (mean): 4 mmHg RV (S/EDP): 23/4 mmHg PA (S/D, mean): 23/10 (14) mmHg PCWP (mean): 13 mmHg  Ao sat: 90% PA sat: 60%  Fick CO: 3.9 L/min Fick CI: 2.5 L/min/m^2   Left Ventricle LV end diastolic pressure is normal. LVEDP 14 mmHg.  Aortic Valve There is no aortic valve stenosis.  _____________   Patient Profile     76 y.o. female w/ a h/o allergic rhinitis, HTN, and HL, who  presented 8/16 w/ a 2 wk h/o cough, chest/sinus congestion, back pain, pleuritic pain/chest wall tenderness, and progressive dyspnea, and was found to have CHF w/ an EF of 20-25%.  Cath 8/21 showed minimal nonobstructive CAD w/ normal filling pressures.  Assessment & Plan    1.  Acute systolic CHF/NICM:  Presented 8/16 w/ a 2 wk h/o progressive dypsnea and cough initially treated as asthmatic bronchitis.  Found to have CHF on CXR.  Echo w/ EF 20-25%.  Diuresed well over the weekend.  L & RHC on 8/21 w/ minimal nonobs LAD/RCA dzs and nl filling pressures.  Minus 900 ml recorded yesterday (no intake recorded however), and minus 5.2L since admission.  Euvolemic on exam this AM and feeling well.  Cont low-dose toprol XL, farxiga, and spiro. Lasix '40mg'$  PO daily resumed today.  Renal fxn/lytes stable.  BP's soft.  H/o angioedema w/ ACEI but tolerated low-dose losartan, which was d/c'd after cath 8/21 in setting of soft BPs in low-90's.  Follow and look to resume as outpt - f/u 9/1 w/ Christell Faith.  2.  Pleuritic chest/back pain: resolved this AM.  Medicine planning u/s to reassess small R effusion (pt request).  3.  Small R pleural effusion:  Noted on chest CT on admission.  ? Role in pleuritic c/p.  For u/s guided thoracentesis today if appropriate.  4.  Essential HTN/Relative hypotension:  Losartan d/c'd 8/21 w/ pressures in the low-90's despite it being held.  BPs stable but soft overnight. Cont current doses of toprol xl and spiro.  5.  HL:  LDL 97.  Cont rosuvastatin 20.  6.  Nonobs CAD:  noted on cath 8/21. Cont asa and statin.  Signed, Murray Hodgkins, NP  08/31/2021, 8:20 AM    For questions or updates, please contact   Please consult [email or web address] for contact info under Cardiology/STEMI.

## 2021-08-31 NOTE — Telephone Encounter (Signed)
Secure chat received from rounding provider Ignacia Bayley NP) requesting that we place a referral to cardiac rehab for NICM/HFrEF. Patient is currently admitted and referral has been placed.

## 2021-08-31 NOTE — Progress Notes (Signed)
Mobility Specialist - Progress Note    08/31/21 1231  Mobility  Activity Ambulated independently in hallway;Stood at bedside;Dangled on edge of bed  Level of Assistance Independent  Assistive Device None  Distance Ambulated (ft) 160 ft  Activity Response Tolerated well  $Mobility charge 1 Mobility    Pt supine in bed on RA upon arrival. Pt STS and ambulates 1 lap around NS indep with slow gait. Pt returns to EOB with needs in reach and visitors in room.   Gretchen Short  Mobility Specialist  08/31/21 12:32 PM

## 2021-08-31 NOTE — Discharge Summary (Signed)
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 female with medical history significant for Hypertension and seasonal allergies who presents to the ED for evaluation of chest pain.  Patient was being treated as an outpatient for possible bronchitis after complaining of sinus congestion, postnasal drip and typical allergy symptoms for the past 2 weeks.  Over the course of the last few days she developed right-sided chest pain of severe intensity radiating from the mid right back to the anterior chest, worse on coughing.   Acute congestive heart failure systolic (EF 14-48% by echo) non-ischemic cardiomyopathy -- patient came in with chest tightness and increasing shortness of breath -- she was started on IV Lasix for diuresis-- to Lasix 40 mg PO daily --- seen by [facility] cardiology-- status post cardiac cath by Dr. Saunders Revel  Mild, nonobstructive coronary artery disease with 10-20% proximal LAD and 20% ostial RCA stenoses.  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 Normal left heart, right heart, and pulmonary artery pressures. Normal Fick cardiac output/index. Recommendations: Defer further diuresis today.  Restart oral furosemide tomorrow. Add spironolactone to optimize goal-directed medical therapy.  Continue with escalation of goal-directed medical therapy as blood pressure and renal function tolerate. -- Continue losartan, metoprolol, Spironolactone,farxiga -- patient overall feels better. Vital signs stable   Right-sided pleural effusion as noted on CT chest -- ultrasound-guided  thoracentesis-- removal of transited if 300 mL fluid. Post thoracentesis x-ray shows expansion of the lung. Patient overall feels better   Hypertension -- meds as above   History of acute bronchitis-- recently completed treatment with Z pack and prednisone -- Pro calcitonin less than .10 -- patient afebrile -- white count normal   Multiple pulmonary nodules as noted on CT scan -- patient will follow-up at six months and repeat CT chest. Defer to PCP Dr. Sabra Heck  Overall hemodynamically stable. Okay from cardiology standpoint for discharge.     Procedures: cardiac cath Family communication : niece Consults : cardiology CODE STATUS: full DVT Prophylaxis :SCD       Consultants: [facility] cardiology Procedures performed: cardiac cath, right-sided paracentesis Disposition: Home Diet recommendation:  Discharge Diet Orders (From admission, onward)     Start     Ordered   08/31/21 0000  Diet - low sodium heart healthy        08/31/21 1358           Cardiac diet DISCHARGE MEDICATION: Allergies as of 08/31/2021       Reactions   Ace Inhibitors Other (See Comments)   Angioedema/ per patient.         Medication List     STOP taking these medications    amLODipine 5 MG tablet Commonly known as: NORVASC   azithromycin 500 MG tablet Commonly known as: ZITHROMAX   cyproheptadine 4 MG tablet Commonly known as: PERIACTIN   methocarbamol 500 MG tablet Commonly known as: Robaxin   predniSONE 20 MG tablet Commonly known as: DELTASONE       TAKE these medications    aspirin EC 81 MG tablet Take 1 tablet (81 mg total) by  mouth daily. Swallow whole. Start taking on: September 01, 2021   dapagliflozin propanediol 10 MG Tabs tablet Commonly known as: FARXIGA Take 1 tablet (10 mg total) by mouth daily. Start taking on: September 01, 2021   Dexilant 60 MG capsule Generic drug: dexlansoprazole Take 1 capsule by mouth daily.   furosemide 40 MG tablet Commonly known  as: LASIX Take 1 tablet (40 mg total) by mouth daily. Start taking on: September 01, 2021   metoprolol succinate 25 MG 24 hr tablet Commonly known as: TOPROL-XL Take 0.5 tablets (12.5 mg total) by mouth daily. Start taking on: September 01, 2021   nitroGLYCERIN 0.4 MG SL tablet Commonly known as: NITROSTAT Place 1 tablet (0.4 mg total) under the tongue every 5 (five) minutes as needed for chest pain.   simvastatin 40 MG tablet Commonly known as: ZOCOR Take 40 mg by mouth daily.   spironolactone 25 MG tablet Commonly known as: ALDACTONE Take 0.5 tablets (12.5 mg total) by mouth daily. Start taking on: September 01, 2021        Follow-up Information     Rise Mu, PA-C Follow up on 09/10/2021.   Specialties: Physician Assistant, Cardiology, Radiology Why: 10:55 - Dr. Donivan Scull PA Contact information: [place] [address] [phone]         [name], MD. Schedule an appointment as soon as possible for a visit in 1 week(s).   Specialty: Internal Medicine Why: Ronney Asters information: [place] [address] [phone]         Minna Merritts, MD .   Specialty: Cardiology Contact information: [place] [address] [phone]                Discharge Exam: Danley Danker Weights   08/30/21 0810 08/31/21 0323 08/31/21 1113  Weight: 56 kg 57.1 kg 55.2 kg     Condition at discharge: good  The results of significant diagnostics from this hospitalization (including imaging, microbiology, ancillary and laboratory) are listed below for reference.   Imaging Studies: DG Chest Port 1 View  Result Date: 08/31/2021 CLINICAL DATA:  Post thoracentesis right-sided EXAM: PORTABLE CHEST 1 VIEW COMPARISON:  Chest 08/25/2021 FINDINGS: Cardiac enlargement. Negative for pulmonary edema. No residual right pleural effusion. No pneumothorax Small left pleural  effusion. Improved aeration left lung base compared to the prior study. IMPRESSION: No pneumothorax post right thoracentesis Improvement in left lower lobe airspace disease and small left effusion Electronically Signed   By: Franchot Gallo M.D.   On: 08/31/2021 10:46   CARDIAC CATHETERIZATION  Result Date: 08/30/2021 Conclusions: Mild, nonobstructive coronary artery disease with 10-20% proximal LAD and 20% ostial RCA stenoses.  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 Normal left heart, right heart, and pulmonary artery pressures. Normal Fick cardiac output/index. Recommendations: Defer further diuresis today.  Restart oral furosemide tomorrow. Add spironolactone to optimize goal-directed medical therapy.  Continue with escalation of goal-directed medical therapy as blood pressure and renal function tolerate. Nelva Bush, MD [facility]  ECHOCARDIOGRAM COMPLETE  Result Date: 08/26/2021    ECHOCARDIOGRAM REPORT   Patient Name:   Glenda May Date of Exam: 08/26/2021 Medical Rec #:  [id number]          Height:       62.0 in Accession #:    [id number]         Weight:       125.0 lb Date  of Birth:  09/28/1945          BSA:          1.566 m Patient Age:    19 years           BP:           122/72 mmHg Patient Gender: F                  HR:           99 bpm. Exam Location:  [facility] Procedure: 2D Echo, Color Doppler, Cardiac Doppler and Strain Analysis Indications:     I50.31 congestive heart failure-Acute Diastolic  History:         Patient has no prior history of Echocardiogram examinations.                  Risk Factors:Hypertension and HCL.  Sonographer:     Charmayne Sheer Referring Phys:  [id number] Athena Masse Diagnosing Phys: Ida Rogue MD  Sonographer Comments: Global longitudinal strain was attempted. IMPRESSIONS  1. Left ventricular ejection fraction, by estimation, is 20 to 25%. The left ventricle has severely decreased function. The left ventricle demonstrates global hypokinesis.  The left ventricular internal cavity size was moderately dilated. Left ventricular diastolic parameters are consistent with Grade II diastolic dysfunction (pseudonormalization). The average left ventricular global longitudinal strain is -5.1 %.  2. Right ventricular systolic function is normal. The right ventricular size is normal. There is mildly elevated pulmonary artery systolic pressure.  3. The mitral valve is normal in structure. Moderate mitral valve regurgitation. No evidence of mitral stenosis.  4. The aortic valve is normal in structure. Aortic valve regurgitation is moderate. No aortic stenosis is present.  5. The inferior vena cava is normal in size with greater than 50% respiratory variability, suggesting right atrial pressure of 3 mmHg. FINDINGS  Left Ventricle: Left ventricular ejection fraction, by estimation, is 20 to 25%. The left ventricle has severely decreased function. The left ventricle demonstrates global hypokinesis. The average left ventricular global longitudinal strain is -5.1 %. The left ventricular internal cavity size was moderately dilated. There is no left ventricular hypertrophy. Left ventricular diastolic parameters are consistent with Grade II diastolic dysfunction (pseudonormalization). Right Ventricle: The right ventricular size is normal. No increase in right ventricular wall thickness. Right ventricular systolic function is normal. There is mildly elevated pulmonary artery systolic pressure. The tricuspid regurgitant velocity is 2.85  m/s, and with an assumed right atrial pressure of 5 mmHg, the estimated right ventricular systolic pressure is 41.9 mmHg. Left Atrium: Left atrial size was normal in size. Right Atrium: Right atrial size was normal in size. Pericardium: There is no evidence of pericardial effusion. Mitral Valve: The mitral valve is normal in structure. Moderate mitral valve regurgitation. No evidence of mitral valve stenosis. Tricuspid Valve: The tricuspid valve  is normal in structure. Tricuspid valve regurgitation is not demonstrated. No evidence of tricuspid stenosis. Aortic Valve: The aortic valve is normal in structure. Aortic valve regurgitation is moderate. Aortic regurgitation PHT measures 134 msec. No aortic stenosis is present. Aortic valve mean gradient measures 2.0 mmHg. Aortic valve peak gradient measures 3.8 mmHg. Aortic valve area, by VTI measures 1.89 cm. Pulmonic Valve: The pulmonic valve was normal in structure. Pulmonic valve regurgitation is not visualized. No evidence of pulmonic stenosis. Aorta: The aortic root is normal in size and structure. Venous: The inferior vena cava is normal in size with greater than 50% respiratory variability, suggesting right atrial  pressure of 3 mmHg. IAS/Shunts: No atrial level shunt detected by color flow Doppler.  LEFT VENTRICLE PLAX 2D LVIDd:         4.66 cm      Diastology LVIDs:         4.25 cm      LV e' medial:    2.39 cm/s LV PW:         1.19 cm      LV E/e' medial:  62.3 LV IVS:        0.63 cm      LV e' lateral:   2.61 cm/s LVOT diam:     1.80 cm      LV E/e' lateral: 57.1 LV SV:         28 LV SV Index:   18           2D Longitudinal Strain LVOT Area:     2.54 cm     2D Strain GLS Avg:     -5.1 %  LV Volumes (MOD) LV vol d, MOD A2C: 111.0 ml LV vol d, MOD A4C: 125.0 ml LV vol s, MOD A2C: 81.7 ml LV vol s, MOD A4C: 71.2 ml LV SV MOD A2C:     29.3 ml LV SV MOD A4C:     125.0 ml LV SV MOD BP:      42.3 ml RIGHT VENTRICLE RV Basal diam:  2.84 cm TAPSE (M-mode): 2.2 cm LEFT ATRIUM             Index        RIGHT ATRIUM           Index LA diam:        3.40 cm 2.17 cm/m   RA Area:     13.40 cm LA Vol (A2C):   54.3 ml 34.68 ml/m  RA Volume:   29.80 ml  19.03 ml/m LA Vol (A4C):   47.5 ml 30.34 ml/m LA Biplane Vol: 51.9 ml 33.15 ml/m  AORTIC VALVE                    PULMONIC VALVE AV Area (Vmax):    1.97 cm     PV Vmax:          0.78 m/s AV Area (Vmean):   2.07 cm     PV Peak grad:     2.5 mmHg AV Area (VTI):      1.89 cm     PR End Diast Vel: 15.52 msec AV Vmax:           97.30 cm/s AV Vmean:          62.700 cm/s AV VTI:            0.148 m AV Peak Grad:      3.8 mmHg AV Mean Grad:      2.0 mmHg LVOT Vmax:         75.30 cm/s LVOT Vmean:        51.100 cm/s LVOT VTI:          0.110 m LVOT/AV VTI ratio: 0.74 AI PHT:            134 msec  AORTA Ao Root diam: 2.90 cm MITRAL VALVE                TRICUSPID VALVE MV Area (PHT): 7.70 cm     TR Peak grad:   32.5 mmHg MV Decel Time: 99 msec  TR Vmax:        285.00 cm/s MR Peak grad: 89.9 mmHg MR Vmax:      474.00 cm/s   SHUNTS MV E velocity: 149.00 cm/s  Systemic VTI:  0.11 m                             Systemic Diam: 1.80 cm Ida Rogue MD Electronically signed by Ida Rogue MD Signature Date/Time: 08/26/2021/12:58:28 PM    Final    CT Angio Chest/Abd/Pel for Dissection W and/or Wo Contrast  Result Date: 08/25/2021 CLINICAL DATA:  Acute aortic syndrome (AAS) suspected EXAM: CT ANGIOGRAPHY CHEST, ABDOMEN AND PELVIS TECHNIQUE: Non-contrast CT of the chest was initially obtained. Multidetector CT imaging through the chest, abdomen and pelvis was performed using the standard protocol during bolus administration of intravenous contrast. Multiplanar reconstructed images and MIPs were obtained and reviewed to evaluate the vascular anatomy. RADIATION DOSE REDUCTION: This exam was performed according to the departmental dose-optimization program which includes automated exposure control, adjustment of the mA and/or kV according to patient size and/or use of iterative reconstruction technique. CONTRAST:  49m OMNIPAQUE IOHEXOL 350 MG/ML SOLN COMPARISON:  None Available. FINDINGS: CTA CHEST FINDINGS Cardiovascular: Satisfactory opacification of the pulmonary arteries to the segmental level. No evidence of pulmonary embolism. The main pulmonary artery is normal in caliber. Enlarged heart size. No significant pericardial effusion. The thoracic aorta aneurysm or dissection. Mild  atherosclerotic plaque of the thoracic aorta. No coronary artery calcifications. Mediastinum/Nodes: No enlarged mediastinal, hilar, or axillary lymph nodes. Thyroid gland, trachea, and esophagus demonstrate no significant findings. Lungs/Pleura: Biapical pleural/pulmonary scarring. Passive atelectasis the bilateral lower lobes. No focal consolidation. Couple right upper lobe pulmonary nodules measuring 7 and 8 mm. Right middle lobe subpleural 8 mm ground-glass nodule (5:60, 9:27). No pulmonary mass. Lingular atelectasis versus scarring. Small right and trace left pleural effusions. No pneumothorax. Musculoskeletal: No chest wall abnormality. Couple nonspecific sclerotic regions of the sternum (9:70, 73). No acute displaced fracture. Multilevel degenerative changes of the spine. Review of the MIP images confirms the above findings. CTA ABDOMEN AND PELVIS FINDINGS VASCULAR Aorta: Mild atherosclerotic plaque. Normal caliber aorta without aneurysm, dissection, vasculitis or significant stenosis. Celiac: Patent without evidence of aneurysm, dissection, vasculitis or significant stenosis. SMA: Patent without evidence of aneurysm, dissection, vasculitis or significant stenosis. Renals: Both renal arteries are patent without evidence of aneurysm, dissection, vasculitis, fibromuscular dysplasia or significant stenosis. IMA: Patent without evidence of aneurysm, dissection, vasculitis or significant stenosis. Inflow: Mild to moderate atherosclerotic plaque. Patent without evidence of aneurysm, dissection, vasculitis or significant stenosis. Veins: No obvious venous abnormality within the limitations of this arterial phase study. Review of the MIP images confirms the above findings. NON-VASCULAR Hepatobiliary: No focal liver abnormality. Status post cholecystectomy. No biliary dilatation. Pancreas: No focal lesion. Normal pancreatic contour. No surrounding inflammatory changes. No main pancreatic ductal dilatation. Spleen:  Normal in size without focal abnormality. Adrenals/Urinary Tract: No adrenal nodule bilaterally. Bilateral kidneys enhance symmetrically. No hydronephrosis. No hydroureter. The urinary bladder is unremarkable. Stomach/Bowel: Stomach is within normal limits. No evidence of bowel wall thickening or dilatation. Colonic diverticulosis. Appendix appears normal. Lymphatic: No lymphadenopathy. Reproductive: Posterior right lobulation of the uterus with query underlying fibroid. Otherwise uterus and bilateral adnexa are unremarkable. Other: No intraperitoneal free fluid. No intraperitoneal free gas. No organized fluid collection. Musculoskeletal: No abdominal wall hernia or abnormality. No suspicious lytic or blastic osseous lesions. No acute displaced fracture. Intervertebral disc space  vacuum phenomenon at the L5-S1 level. Review of the MIP images confirms the above findings. IMPRESSION: 1. No acute thoracic abnormality. 2. No pulmonary embolus. 3. Couple right upper lobe pulmonary nodules measuring 7 and 8 mm. Right middle lobe subpleural 8 mm ground-glass nodule. Initial follow-up with CT at 6 months is recommended to confirm persistence. If persistent, repeat CT is recommended every 2 years until 5 years of stability has been established. This recommendation follows the consensus statement: Guidelines for Management of Incidental Pulmonary Nodules Detected on CT Images: From the Fleischner Society 2017; Radiology 2017; 284:228-243. 4. Small right and trace left pleural effusions. 5. Cardiomegaly. 6. Couple nonspecific sclerotic regions of the sternum. Correlate with history of prior nondisplaced fractures versus history of metastatic malignancy. Recommend attention on follow-up. 7. Colonic diverticulosis with no acute diverticulitis. 8. Likely uterine fibroid. Electronically Signed   By: Iven Finn M.D.   On: 08/25/2021 18:56   DG Chest 2 View  Result Date: 08/25/2021 CLINICAL DATA:  Cough and dyspnea EXAM:  CHEST - 2 VIEW COMPARISON:  None available FINDINGS: Low lung volumes. Borderline cardiomegaly though evaluation is limited by low lung volumes. Pulmonary vascular congestion. Hazy airspace and interstitial opacities in the lower lungs. Small bilateral pleural effusions. No pneumothorax. Aortic calcification. IMPRESSION: Airspace and interstitial opacities in the lower lungs favored to represent pulmonary edema though infection is not excluded. Small bilateral pleural effusions. Aortic Atherosclerosis (ICD10-I70.0). Electronically Signed   By: Placido Sou M.D.   On: 08/25/2021 17:35    Microbiology: Results for orders placed or performed during the hospital encounter of 08/25/21  Resp Panel by RT-PCR (Flu A&B, Covid) Anterior Nasal Swab     Status: None   Collection Time: 08/25/21  4:38 PM   Specimen: Anterior Nasal Swab  Result Value Ref Range Status   SARS Coronavirus 2 by RT PCR NEGATIVE NEGATIVE Final    Comment: (NOTE) SARS-CoV-2 target nucleic acids are NOT DETECTED.  The SARS-CoV-2 RNA is generally detectable in upper respiratory specimens during the acute phase of infection. The lowest concentration of SARS-CoV-2 viral copies this assay can detect is 138 copies/mL. A negative result does not preclude SARS-Cov-2 infection and should not be used as the sole basis for treatment or other patient management decisions. A negative result may occur with  improper specimen collection/handling, submission of specimen other than nasopharyngeal swab, presence of viral mutation(s) within the areas targeted by this assay, and inadequate number of viral copies(<138 copies/mL). A negative result must be combined with clinical observations, patient history, and epidemiological information. The expected result is Negative.  Fact Sheet for Patients:  EntrepreneurPulse.com.au  Fact Sheet for Healthcare Providers:  IncredibleEmployment.be  This test is no t  yet approved or cleared by the [place] FDA and  has been authorized for detection and/or diagnosis of SARS-CoV-2 by FDA under an Emergency Use Authorization (EUA). This EUA will remain  in effect (meaning this test can be used) for the duration of the COVID-19 declaration under Section 564(b)(1) of the Act, 21 U.S.C.section 360bbb-3(b)(1), unless the authorization is terminated  or revoked sooner.       Influenza A by PCR NEGATIVE NEGATIVE Final   Influenza B by PCR NEGATIVE NEGATIVE Final    Comment: (NOTE) The Xpert Xpress SARS-CoV-2/FLU/RSV plus assay is intended as an aid in the diagnosis of influenza from Nasopharyngeal swab specimens and should not be used as a sole basis for treatment. Nasal washings and aspirates are unacceptable for Xpert Xpress SARS-CoV-2/FLU/RSV testing.  Fact Sheet for Patients: EntrepreneurPulse.com.au  Fact Sheet for Healthcare Providers: IncredibleEmployment.be  This test is not yet approved or cleared by the [place] FDA and has been authorized for detection and/or diagnosis of SARS-CoV-2 by FDA under an Emergency Use Authorization (EUA). This EUA will remain in effect (meaning this test can be used) for the duration of the COVID-19 declaration under Section 564(b)(1) of the Act, 21 U.S.C. section 360bbb-3(b)(1), unless the authorization is terminated or revoked.  Performed at [facility], [place], [place] [address]   Respiratory (~20 pathogens) panel by PCR     Status: None   Collection Time: 08/27/21  1:30 PM   Specimen: Nasopharyngeal Swab; Respiratory  Result Value Ref Range Status   Adenovirus NOT DETECTED NOT DETECTED Final   Coronavirus 229E NOT DETECTED NOT DETECTED Final    Comment: (NOTE) The Coronavirus on the Respiratory Panel, DOES NOT test for the novel  Coronavirus (2019 nCoV)    Coronavirus HKU1 NOT DETECTED NOT DETECTED Final   Coronavirus NL63 NOT  DETECTED NOT DETECTED Final   Coronavirus OC43 NOT DETECTED NOT DETECTED Final   Metapneumovirus NOT DETECTED NOT DETECTED Final   Rhinovirus / Enterovirus NOT DETECTED NOT DETECTED Final   Influenza A NOT DETECTED NOT DETECTED Final   Influenza B NOT DETECTED NOT DETECTED Final   Parainfluenza Virus 1 NOT DETECTED NOT DETECTED Final   Parainfluenza Virus 2 NOT DETECTED NOT DETECTED Final   Parainfluenza Virus 3 NOT DETECTED NOT DETECTED Final   Parainfluenza Virus 4 NOT DETECTED NOT DETECTED Final   Respiratory Syncytial Virus NOT DETECTED NOT DETECTED Final   Bordetella pertussis NOT DETECTED NOT DETECTED Final   Bordetella Parapertussis NOT DETECTED NOT DETECTED Final   Chlamydophila pneumoniae NOT DETECTED NOT DETECTED Final   Mycoplasma pneumoniae NOT DETECTED NOT DETECTED Final    Comment: Performed at [facility], [place]. [address], [place], [place] [address]    Labs: CBC: Recent Labs  Lab 08/25/21 1638 08/27/21 3818 08/28/21 0603 08/29/21 0458 08/30/21 0509  WBC 6.6 8.8 7.4 8.1 7.1  NEUTROABS 5.7 6.3 4.0 5.1 4.0  HGB 11.5* 11.1* 11.6* 12.4 13.2  HCT 34.9* 33.2* 34.1* 37.8 39.7  MCV 92.8 91.0 88.8 90.6 91.1  PLT 232 205 219 233 299   Basic Metabolic Panel: Recent Labs  Lab 08/27/21 0833 08/28/21 0603 08/28/21 1323 08/29/21 0458 08/30/21 0509 08/31/21 0546  NA 129* 134*  --  134* 134* 133*  K 3.3* 3.3* 5.0 4.7 4.2 4.1  CL 92* 99  --  99 94* 100  CO2 26 28  --  '28 28 25  '$ GLUCOSE 129* 95  --  110* 90 108*  BUN 17 17  --  20 32* 19  CREATININE 0.81 0.74  --  0.83 1.06* 0.69  CALCIUM 8.7* 8.6*  --  9.0 8.8* 8.7*  MG 2.0 2.2 2.4 2.4 2.3  --   PHOS 3.4 3.3  --  4.1 5.5*  --    Liver Function Tests: Recent Labs  Lab 08/25/21 1638 08/27/21 0833 08/28/21 0603 08/29/21 0458 08/30/21 0509  AST 45* '26 26 25 23  '$ ALT 35 31 33 43 37  ALKPHOS 81 63 60 62 59  BILITOT 0.7 0.4 0.4 0.7 0.4  PROT 7.5 6.6 6.5 6.9 6.5  ALBUMIN 4.1 3.7 3.7 3.8 3.5    Discharge  time spent: greater than 30 minutes.  Signed: Fritzi Mandes, MD [facility] 08/31/2021

## 2021-09-01 ENCOUNTER — Other Ambulatory Visit: Payer: Self-pay | Admitting: *Deleted

## 2021-09-01 DIAGNOSIS — I5032 Chronic diastolic (congestive) heart failure: Secondary | ICD-10-CM

## 2021-09-06 LAB — MISC LABCORP TEST (SEND OUT): Labcorp test code: 5367

## 2021-09-10 ENCOUNTER — Ambulatory Visit: Payer: Medicare Other | Admitting: Physician Assistant

## 2021-09-11 NOTE — Progress Notes (Deleted)
   Patient ID: Glenda May, female    DOB: [birth year], 76 y.o.   MRN: [id number]  HPI  Glenda May is a 76 y/o female with a history of  Echo report from 08/26/21 reviewed and showed an EF of 20-25% along with mildly elevated PA pressure and moderate MR.   RHC/LHC done 08/30/21 and showed: Mild, nonobstructive coronary artery disease with 10-20% proximal LAD and 20% ostial RCA stenoses.  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 Normal left heart, right heart, and pulmonary artery pressures. Normal Fick cardiac output/index.  Admitted 08/25/21 due to chest pain/tightness with worsening SOB. Initially given IV lasix with transition to oral diuretics. Cardiology consult obtained. Echo and cath done. Right-sided thoracentesis done with removal of 342m. Multiple pulmonary nodules as noted on CT scan. PT/OT evaluations done.       Discharged after 6 days.   She presents today for her initial visit with a chief complaint of  Review of Systems    Physical Exam  Assessment & Plan:  1: Chronic heart failure with reduced ejection fraction- - NYHA class  - BNP 08/26/21 was 2082.8  2: HTN- - BP - saw PCP (Glenda May 09/07/21 - BMP 09/07/21 reviewed and showed sodium 135, potassium 4.6, creatinine 1.0 and GFR 65  3: Pulmonary nodules- - noted on CT - repeat CT in 6 months

## 2021-09-14 ENCOUNTER — Ambulatory Visit: Payer: Medicare Other | Admitting: Family

## 2021-09-15 ENCOUNTER — Encounter: Payer: Self-pay | Admitting: Pharmacist

## 2021-09-15 ENCOUNTER — Encounter: Payer: Self-pay | Admitting: Family

## 2021-09-15 ENCOUNTER — Ambulatory Visit: Payer: Medicare Other | Attending: Family | Admitting: Family

## 2021-09-15 VITALS — BP 127/63 | HR 90 | Resp 18 | Ht 62.0 in | Wt 122.4 lb

## 2021-09-15 DIAGNOSIS — Z79899 Other long term (current) drug therapy: Secondary | ICD-10-CM | POA: Insufficient documentation

## 2021-09-15 DIAGNOSIS — R918 Other nonspecific abnormal finding of lung field: Secondary | ICD-10-CM | POA: Insufficient documentation

## 2021-09-15 DIAGNOSIS — I5022 Chronic systolic (congestive) heart failure: Secondary | ICD-10-CM | POA: Insufficient documentation

## 2021-09-15 DIAGNOSIS — R0602 Shortness of breath: Secondary | ICD-10-CM | POA: Insufficient documentation

## 2021-09-15 DIAGNOSIS — I10 Essential (primary) hypertension: Secondary | ICD-10-CM | POA: Diagnosis not present

## 2021-09-15 DIAGNOSIS — E785 Hyperlipidemia, unspecified: Secondary | ICD-10-CM | POA: Diagnosis not present

## 2021-09-15 DIAGNOSIS — R911 Solitary pulmonary nodule: Secondary | ICD-10-CM

## 2021-09-15 DIAGNOSIS — R42 Dizziness and giddiness: Secondary | ICD-10-CM | POA: Diagnosis not present

## 2021-09-15 DIAGNOSIS — I2510 Atherosclerotic heart disease of native coronary artery without angina pectoris: Secondary | ICD-10-CM | POA: Diagnosis not present

## 2021-09-15 DIAGNOSIS — Z7984 Long term (current) use of oral hypoglycemic drugs: Secondary | ICD-10-CM | POA: Diagnosis not present

## 2021-09-15 DIAGNOSIS — I110 Hypertensive heart disease with heart failure: Secondary | ICD-10-CM | POA: Diagnosis not present

## 2021-09-15 NOTE — Patient Instructions (Addendum)
Continue weighing daily and call for an overnight weight gain of 3 pounds or more or a weekly weight gain of more than 5 pounds.   If you have voicemail, please make sure your mailbox is cleaned out so that we may leave a message and please make sure to listen to any voicemails.    Increase your metoprolol to 1 whole tablet every day

## 2021-09-15 NOTE — Progress Notes (Signed)
Patient ID: Glenda May, female   DOB: February 14, 1945, 76 y.o.   MRN: [id number] [facility] - PHARMACIST COUNSELING NOTE  Guideline-Directed Medical Therapy/Evidence Based Medicine  ACE/ARB/ARNI:  none - angioedema with ACEi Beta Blocker: Metoprolol succinate 12.5 mg daily Aldosterone Antagonist: Spironolactone 25 mg daily Diuretic: Furosemide 40 mg daily SGLT2i: Dapagliflozin 10 mg daily  Adherence Assessment  Do you ever forget to take your medication? '[]'$ Yes '[x]'$ No  Do you ever skip doses due to side effects? '[]'$ Yes '[x]'$ No  Do you have trouble affording your medicines? '[]'$ Yes '[x]'$ No  Are you ever unable to pick up your medication due to transportation difficulties? '[]'$ Yes '[x]'$ No  Do you ever stop taking your medications because you don't believe they are helping? '[]'$ Yes '[x]'$ No  Do you check your weight daily? '[x]'$ Yes '[]'$ No   Adherence strategy: none  Barriers to obtaining medications: none  Vital signs: HR 90, BP 127/63, weight (pounds) 122 lb ECHO: Date 08/26/2021, EF 20-25%, notes  mildly elevated PA pressure  and moderate MR.      Latest Ref Rng & Units 08/31/2021    5:46 AM 08/30/2021    5:09 AM 08/29/2021    4:58 AM  BMP  Glucose 70 - 99 mg/dL 108  90  110   BUN 8 - 23 mg/dL 19  32  20   Creatinine 0.44 - 1.00 mg/dL 0.69  1.06  0.83   Sodium 135 - 145 mmol/L 133  134  134   Potassium 3.5 - 5.1 mmol/L 4.1  4.2  4.7   Chloride 98 - 111 mmol/L 100  94  99   CO2 22 - 32 mmol/L '25  28  28   '$ Calcium 8.9 - 10.3 mg/dL 8.7  8.8  9.0     Past Medical History:  Diagnosis Date   Allergic rhinitis    High cholesterol    Hypertension     ASSESSMENT 76 year old female who presents to the HF clinic for initial visit. PMH includes hyperlipidemia, HTN, pulmonary nodules and chronic heart failure. Last acute care admission was 08/25/2021 for bronchitis. During last acute care admission patient was newly diagnosed with heart failure.    Patient presents to OV accompany by her daughter. Medrec was completed. All questions related to medication answered. Dietary modifications were also discussed with emphasis on low sodium diet.  PLAN  Increase metoprolol to '25mg'$  daily Work on lifestyle modifications (low sodium diet) Monitor weight and BP at home.   Time spent: 30 minutes  Avik Leoni Rodriguez-Guzman PharmD, BCPS 09/21/2021 2:18 PM     Current Outpatient Medications:    aspirin EC 81 MG tablet, Take 1 tablet (81 mg total) by mouth daily. Swallow whole., Disp: 30 tablet, Rfl: 12   calcium carbonate (OSCAL) 1500 (600 Ca) MG TABS tablet, Take 600 mg of elemental calcium by mouth 2 (two) times daily with a meal., Disp: , Rfl:    dapagliflozin propanediol (FARXIGA) 10 MG TABS tablet, Take 1 tablet (10 mg total) by mouth daily., Disp: 30 tablet, Rfl: 2   DEXILANT 60 MG capsule, Take 1 capsule by mouth daily., Disp: , Rfl:    furosemide (LASIX) 40 MG tablet, Take 1 tablet (40 mg total) by mouth daily., Disp: 30 tablet, Rfl: 3   metoprolol succinate (TOPROL-XL) 25 MG 24 hr tablet, Take 0.5 tablets (12.5 mg total) by mouth daily. (Patient taking differently: Take 25 mg by mouth daily.), Disp: 30 tablet, Rfl: 3   Multiple Vitamins-Minerals (  WOMENS MULTI GUMMIES PO), Take 1 each by mouth daily., Disp: , Rfl:    nitroGLYCERIN (NITROSTAT) 0.4 MG SL tablet, Place 1 tablet (0.4 mg total) under the tongue every 5 (five) minutes as needed for chest pain. (Patient not taking: Reported on 09/15/2021), Disp: 20 tablet, Rfl: 12   simvastatin (ZOCOR) 40 MG tablet, Take 40 mg by mouth daily., Disp: , Rfl:    spironolactone (ALDACTONE) 25 MG tablet, Take 0.5 tablets (12.5 mg total) by mouth daily., Disp: 30 tablet, Rfl: 3   MEDICATION ADHERENCES TIPS AND STRATEGIES Taking medication as prescribed improves patient outcomes in heart failure (reduces hospitalizations, improves symptoms, increases survival) Side effects of medications can be  managed by decreasing doses, switching agents, stopping drugs, or adding additional therapy. Please let someone in the [facility] know if you have having bothersome side effects so we can modify your regimen. Do not alter your medication regimen without talking to [place].  Medication reminders can help patients remember to take drugs on time. If you are missing or forgetting doses you can try linking behaviors, using pill boxes, or an electronic reminder like an alarm on your phone or an app. Some people can also get automated phone calls as medication reminders.

## 2021-09-15 NOTE — Progress Notes (Signed)
Patient ID: Glenda May, female    DOB: Nov 14, 1945, 76 y.o.   MRN: [id number]  HPI  [name] is a 76 y/o female with a history of hyperlipidemia, HTN, pulmonary nodules and chronic heart failure.    Echo report from 08/26/21 reviewed and showed an EF of 20-25% along with mildly elevated PA pressure and moderate MR.   RHC/LHC done 08/30/21 and showed: Mild, nonobstructive coronary artery disease with 10-20% proximal LAD and 20% ostial RCA stenoses.  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 Normal left heart, right heart, and pulmonary artery pressures. Normal Fick cardiac output/index.  Admitted 08/25/21 due to chest pain/tightness with worsening SOB. Initially given IV lasix with transition to oral diuretics. Cardiology consult obtained. Echo and cath done. Right-sided thoracentesis done with removal of 353m. Multiple pulmonary nodules as noted on CT scan. PT/OT evaluations done.       Discharged after 6 days.   She presents today for her initial visit with a chief complaint of minimal fatigue upon moderate exertion. Describes this as chronic in nature. She has associated shortness of breath, chronic difficulty sleeping and occasional light-headedness with sudden position changes along with this. She denies any abdominal distention, palpitations, pedal edema, chest pain, cough or weight gain.   [facility]  Past Medical History:  Diagnosis Date   Allergic rhinitis    High cholesterol    Hypertension    Past Surgical History:  Procedure Laterality Date   APPENDECTOMY     CHOLECYSTECTOMY     LUMBAR LAMINECTOMY     L4-5   RIGHT/LEFT HEART CATH AND CORONARY ANGIOGRAPHY N/A 08/30/2021   Procedure: RIGHT/LEFT HEART CATH AND CORONARY ANGIOGRAPHY;  Surgeon: ENelva Bush MD;  [facility];  Service: Cardiovascular;  Laterality: N/A;   TONSILLECTOMY     Family History  Problem Relation Age of Onset    Stroke Mother        died @ 939  Dementia Mother    Stroke Father        died @ 634  Stroke Sister    Heart attack Brother        died @ 821  Breast cancer Neg Hx    Social History   Tobacco Use   Smoking status: Never   Smokeless tobacco: Not on file  Substance Use Topics   Alcohol use: Not Currently   Allergies  Allergen Reactions   Ace Inhibitors Other (See Comments)    Angioedema/ per patient.    Prior to Admission medications   Medication Sig Start Date End Date Taking? Authorizing Provider  aspirin EC 81 MG tablet Take 1 tablet (81 mg total) by mouth daily. Swallow whole. 09/01/21  Yes PFritzi Mandes MD  calcium carbonate (OSCAL) 1500 (600 Ca) MG TABS tablet Take 600 mg of elemental calcium by mouth 2 (two) times daily with a meal.   Yes [provider]  dapagliflozin propanediol (FARXIGA) 10 MG TABS tablet Take 1 tablet (10 mg total) by mouth daily. 09/01/21  Yes PFritzi Mandes MD  DEXILANT 60 MG capsule Take 1 capsule by mouth daily. 08/17/21  Yes [provider]  furosemide (LASIX) 40 MG tablet Take 1 tablet (40 mg total) by mouth daily. 09/01/21  Yes PFritzi Mandes MD  metoprolol succinate (TOPROL-XL) 25 MG 24 hr tablet Take 0.5 tablets (12.5 mg total) by mouth daily. 09/01/21  Yes PFritzi Mandes MD  Multiple Vitamins-Minerals (WOMENS MULTI GUMMIES PO)  Take 1 each by mouth daily.   Yes [provider]  simvastatin (ZOCOR) 40 MG tablet Take 40 mg by mouth daily. 06/25/21  Yes [provider]  spironolactone (ALDACTONE) 25 MG tablet Take 0.5 tablets (12.5 mg total) by mouth daily. 09/01/21  Yes Fritzi Mandes, MD  nitroGLYCERIN (NITROSTAT) 0.4 MG SL tablet Place 1 tablet (0.4 mg total) under the tongue every 5 (five) minutes as needed for chest pain. Patient not taking: Reported on 09/15/2021 08/31/21   Fritzi Mandes, MD    Review of Systems  Constitutional:  Positive for fatigue. Negative for appetite change.  HENT:  Negative for congestion, postnasal drip  and sore throat.   Eyes: Negative.   Respiratory:  Positive for shortness of breath. Negative for cough and chest tightness.   Cardiovascular:  Negative for chest pain, palpitations and leg swelling.  Gastrointestinal:  Negative for abdominal distention and abdominal pain.  Endocrine: Negative.   Genitourinary: Negative.   Musculoskeletal:  Negative for back pain and neck pain.  Skin: Negative.   Allergic/Immunologic: Negative.   Neurological:  Positive for light-headedness (with sudden position changes). Negative for dizziness.  Hematological:  Negative for adenopathy. Does not bruise/bleed easily.  Psychiatric/Behavioral:  Positive for sleep disturbance (chronic; sleeping on 2 pillows). Negative for dysphoric mood. The patient is not nervous/anxious.     Vitals:   09/15/21 1159  BP: 127/63  Pulse: 90  Resp: 18  SpO2: 100%  Weight: 122 lb 6 oz (55.5 kg)  Height: '5\' 2"'$  (1.575 m)   Wt Readings from Last 3 Encounters:  09/15/21 122 lb 6 oz (55.5 kg)  08/31/21 121 lb 11.2 oz (55.2 kg)  06/11/20 132 lb (59.9 kg)   Lab Results  Component Value Date   CREATININE 0.69 08/31/2021   CREATININE 1.06 (H) 08/30/2021   CREATININE 0.83 08/29/2021   Physical Exam Vitals and nursing note reviewed. Exam conducted with a chaperone present (niece).  Constitutional:      Appearance: Normal appearance.  HENT:     Head: Normocephalic and atraumatic.  Cardiovascular:     Rate and Rhythm: Normal rate and regular rhythm.  Pulmonary:     Effort: Pulmonary effort is normal. No respiratory distress.     Breath sounds: No wheezing or rales.  Abdominal:     General: There is no distension.     Palpations: Abdomen is soft.     Tenderness: There is no abdominal tenderness.  Musculoskeletal:        General: No tenderness.     Cervical back: Normal range of motion and neck supple.     Right lower leg: No edema.     Left lower leg: No edema.  Skin:    General: Skin is warm and dry.   Neurological:     General: No focal deficit present.     Mental Status: She is alert and oriented to person, place, and time.  Psychiatric:        Mood and Affect: Mood normal.        Behavior: Behavior normal.        Thought Content: Thought content normal.    Assessment & Plan:  1: Chronic heart failure with reduced ejection fraction- - NYHA class II - euvolemic today - weighing daily; reminded to call for an overnight weight gain of > 2 pounds or a weekly weight gain of > 5 pounds - not adding any salt to her food and has been reading food labels for sodium content -  on GDMT of farxiga, metoprolol and spironolactone - will increase her metoprolol to '25mg'$  daily; was previously taking 12.5 - had angioedema with ACE-i - BNP 08/26/21 was 2082.8 - PharmD reconciled medications with the patient  2: HTN- - BP looks good (127/63) - saw PCP Sabra Heck) 09/07/21 - BMP 09/07/21 reviewed and showed sodium 135, potassium 4.6, creatinine 1.0 and GFR 65  3: Pulmonary nodules- - noted on CT - repeat CT in 6 months   Medication bottles reviewed.   Return in 1 month, sooner if needed.

## 2021-09-16 ENCOUNTER — Ambulatory Visit: Payer: Medicare Other | Admitting: Family

## 2021-09-16 ENCOUNTER — Encounter: Payer: Self-pay | Admitting: Family

## 2021-09-24 ENCOUNTER — Ambulatory Visit: Payer: Medicare Other | Admitting: Cardiology

## 2021-09-29 ENCOUNTER — Encounter: Payer: Medicare Other | Attending: Internal Medicine | Admitting: *Deleted

## 2021-09-29 DIAGNOSIS — I5022 Chronic systolic (congestive) heart failure: Secondary | ICD-10-CM

## 2021-09-29 NOTE — Progress Notes (Signed)
Initial telephone orientation completed. Diagnosis can be found in [facility] 8/16. EP orientation scheduled for Monday 10/2 at 1:30pm

## 2021-10-11 ENCOUNTER — Encounter: Payer: Medicare Other | Attending: Internal Medicine | Admitting: *Deleted

## 2021-10-11 VITALS — Ht 62.75 in | Wt 121.1 lb

## 2021-10-11 DIAGNOSIS — I5022 Chronic systolic (congestive) heart failure: Secondary | ICD-10-CM | POA: Diagnosis present

## 2021-10-11 NOTE — Patient Instructions (Signed)
Patient Instructions  Patient Details  Name: Glenda May MRN: [id number] Date of Birth: [birth year] Referring Provider:  [name], MD  Below are your personal goals for exercise, nutrition, and risk factors. Our goal is to help you stay on track towards obtaining and maintaining these goals. We will be discussing your progress on these goals with you throughout the program.  Initial Exercise Prescription:  Initial Exercise Prescription - 10/11/21 1500       Date of Initial Exercise RX and Referring Provider   Date 10/11/21    Referring Provider Emily Filbert      Oxygen   Maintain Oxygen Saturation 88% or higher      Treadmill   MPH 2.3    Grade 0.5    Minutes 15    METs 2.92      Recumbant Bike   Level 2    RPM 50    Minutes 15    METs 2.98      NuStep   Level 2    SPM 80    Minutes 15    METs 2.92      REL-XR   Level 2    Speed 50    Minutes 15    METs 2.92      T5 Nustep   Level 1    SPM 80    Minutes 15    METs 2.92      Biostep-RELP   Level 2    SPM 50    Minutes 15    METs 2.92      Prescription Details   Frequency (times per week) 2    Duration Progress to 30 minutes of continuous aerobic without signs/symptoms of physical distress      Intensity   THRR 40-80% of Max Heartrate 107-130    Ratings of Perceived Exertion 11-13    Perceived Dyspnea 0-4      Progression   Progression Continue to progress workloads to maintain intensity without signs/symptoms of physical distress.      Resistance Training   Training Prescription Yes    Weight 3    Reps 10-15             Exercise Goals: Frequency: Be able to perform aerobic exercise two to three times per week in program working toward 2-5 days per week of home exercise.  Intensity: Work with a perceived exertion of 11 (fairly light) - 15 (hard) while following your exercise prescription.  We will make changes to your prescription with you as you progress through the program.    Duration: Be able to do 30 to 45 minutes of continuous aerobic exercise in addition to a 5 minute warm-up and a 5 minute cool-down routine.   Nutrition Goals: Your personal nutrition goals will be established when you do your nutrition analysis with the dietician.  The following are general nutrition guidelines to follow: Cholesterol < '200mg'$ /day Sodium < '1500mg'$ /day Fiber: Women over 50 yrs - 21 grams per day  Personal Goals:  Personal Goals and Risk Factors at Admission - 10/11/21 1525       Core Components/Risk Factors/Patient Goals on Admission    Weight Management Yes    Intervention Weight Management: Develop a combined nutrition and exercise program designed to reach desired caloric intake, while maintaining appropriate intake of nutrient and fiber, sodium and fats, and appropriate energy expenditure required for the weight goal.;Weight Management: Provide education and appropriate resources to help participant work on and attain dietary goals.  Admit Weight 121 lb 1.6 oz (54.9 kg)    Goal Weight: Short Term 125 lb (56.7 kg)    Goal Weight: Long Term 130 lb (59 kg)    Expected Outcomes Short Term: Continue to assess and modify interventions until short term weight is achieved;Long Term: Adherence to nutrition and physical activity/exercise program aimed toward attainment of established weight goal;Understanding recommendations for meals to include 15-35% energy as protein, 25-35% energy from fat, 35-60% energy from carbohydrates, less than '200mg'$  of dietary cholesterol, 20-35 gm of total fiber daily;Understanding of distribution of calorie intake throughout the day with the consumption of 4-5 meals/snacks;Weight Gain: Understanding of general recommendations for a high calorie, high protein meal plan that promotes weight gain by distributing calorie intake throughout the day with the consumption for 4-5 meals, snacks, and/or supplements    Heart Failure Yes    Intervention Provide a  combined exercise and nutrition program that is supplemented with education, support and counseling about heart failure. Directed toward relieving symptoms such as shortness of breath, decreased exercise tolerance, and extremity edema.    Expected Outcomes Improve functional capacity of life;Short term: Attendance in program 2-3 days a week with increased exercise capacity. Reported lower sodium intake. Reported increased fruit and vegetable intake. Reports medication compliance.;Short term: Daily weights obtained and reported for increase. Utilizing diuretic protocols set by physician.;Long term: Adoption of self-care skills and reduction of barriers for early signs and symptoms recognition and intervention leading to self-care maintenance.    Hypertension Yes    Intervention Provide education on lifestyle modifcations including regular physical activity/exercise, weight management, moderate sodium restriction and increased consumption of fresh fruit, vegetables, and low fat dairy, alcohol moderation, and smoking cessation.;Monitor prescription use compliance.    Expected Outcomes Short Term: Continued assessment and intervention until BP is < 140/82m HG in hypertensive participants. < 130/833mHG in hypertensive participants with diabetes, heart failure or chronic kidney disease.;Long Term: Maintenance of blood pressure at goal levels.    Lipids Yes    Intervention Provide education and support for participant on nutrition & aerobic/resistive exercise along with prescribed medications to achieve LDL '70mg'$ , HDL >'40mg'$ .    Expected Outcomes Short Term: Participant states understanding of desired cholesterol values and is compliant with medications prescribed. Participant is following exercise prescription and nutrition guidelines.;Long Term: Cholesterol controlled with medications as prescribed, with individualized exercise RX and with personalized nutrition plan. Value goals: LDL < '70mg'$ , HDL > 40 mg.              Tobacco Use Initial Evaluation: Social History   Tobacco Use  Smoking Status Never  Smokeless Tobacco Not on file    Exercise Goals and Review:  Exercise Goals     Row Name 10/11/21 1522             Exercise Goals   Increase Physical Activity Yes       Intervention Provide advice, education, support and counseling about physical activity/exercise needs.       Expected Outcomes Short Term: Attend rehab on a regular basis to increase amount of physical activity.;Long Term: Add in home exercise to make exercise part of routine and to increase amount of physical activity.;Long Term: Exercising regularly at least 3-5 days a week.       Increase Strength and Stamina Yes       Intervention Develop an individualized exercise prescription for aerobic and resistive training based on initial evaluation findings, risk stratification, comorbidities and participant's personal goals.;Provide advice, education,  support and counseling about physical activity/exercise needs.       Expected Outcomes Short Term: Increase workloads from initial exercise prescription for resistance, speed, and METs.;Short Term: Perform resistance training exercises routinely during rehab and add in resistance training at home;Long Term: Improve cardiorespiratory fitness, muscular endurance and strength as measured by increased METs and functional capacity (6MWT)       Able to understand and use rate of perceived exertion (RPE) scale Yes       Intervention Provide education and explanation on how to use RPE scale       Expected Outcomes Short Term: Able to use RPE daily in rehab to express subjective intensity level;Long Term:  Able to use RPE to guide intensity level when exercising independently       Able to understand and use Dyspnea scale Yes       Intervention Provide education and explanation on how to use Dyspnea scale       Expected Outcomes Short Term: Able to use Dyspnea scale daily in rehab to express  subjective sense of shortness of breath during exertion;Long Term: Able to use Dyspnea scale to guide intensity level when exercising independently       Knowledge and understanding of Target Heart Rate Range (THRR) Yes       Intervention Provide education and explanation of THRR including how the numbers were predicted and where they are located for reference       Expected Outcomes Short Term: Able to state/look up THRR;Short Term: Able to use daily as guideline for intensity in rehab;Long Term: Able to use THRR to govern intensity when exercising independently       Able to check pulse independently Yes       Intervention Provide education and demonstration on how to check pulse in carotid and radial arteries.;Review the importance of being able to check your own pulse for safety during independent exercise       Expected Outcomes Short Term: Able to explain why pulse checking is important during independent exercise;Long Term: Able to check pulse independently and accurately       Understanding of Exercise Prescription Yes       Intervention Provide education, explanation, and written materials on patient's individual exercise prescription       Expected Outcomes Short Term: Able to explain program exercise prescription;Long Term: Able to explain home exercise prescription to exercise independently                Copy of goals given to participant.

## 2021-10-11 NOTE — Progress Notes (Signed)
Cardiac Individual Treatment Plan  Patient Details  Name: Glenda May MRN: [id number] Date of Birth: Mar 28, 1945 Referring Provider:   Flowsheet Row Cardiac Rehab from 10/11/2021 in [facility] Cardiac and Pulmonary Rehab  Referring Provider Emily Filbert       Initial Encounter Date:  Flowsheet Row Cardiac Rehab from 10/11/2021 in [facility] Cardiac and Pulmonary Rehab  Date 10/11/21       Visit Diagnosis: Heart failure, chronic systolic ([place])  Patient's Home Medications on Admission:  Current Outpatient Medications:    aspirin EC 81 MG tablet, Take 1 tablet (81 mg total) by mouth daily. Swallow whole., Disp: 30 tablet, Rfl: 12   calcium carbonate (OSCAL) 1500 (600 Ca) MG TABS tablet, Take 600 mg of elemental calcium by mouth 2 (two) times daily with a meal., Disp: , Rfl:    dapagliflozin propanediol (FARXIGA) 10 MG TABS tablet, Take 1 tablet (10 mg total) by mouth daily., Disp: 30 tablet, Rfl: 2   DEXILANT 60 MG capsule, Take 1 capsule by mouth daily., Disp: , Rfl:    furosemide (LASIX) 40 MG tablet, Take 1 tablet (40 mg total) by mouth daily., Disp: 30 tablet, Rfl: 3   metoprolol succinate (TOPROL-XL) 25 MG 24 hr tablet, Take 0.5 tablets (12.5 mg total) by mouth daily. (Patient taking differently: Take 25 mg by mouth daily.), Disp: 30 tablet, Rfl: 3   Multiple Vitamins-Minerals (WOMENS MULTI GUMMIES PO), Take 1 each by mouth daily., Disp: , Rfl:    nitroGLYCERIN (NITROSTAT) 0.4 MG SL tablet, Place 1 tablet (0.4 mg total) under the tongue every 5 (five) minutes as needed for chest pain., Disp: 20 tablet, Rfl: 12   simvastatin (ZOCOR) 40 MG tablet, Take 40 mg by mouth daily., Disp: , Rfl:    spironolactone (ALDACTONE) 25 MG tablet, Take 0.5 tablets (12.5 mg total) by mouth daily., Disp: 30 tablet, Rfl: 3  Past Medical History: Past Medical History:  Diagnosis Date   Allergic rhinitis    CHF (congestive heart failure) (HCC)    High cholesterol    Hypertension     Tobacco Use: Social  History   Tobacco Use  Smoking Status Never  Smokeless Tobacco Not on file    Labs: Review Flowsheet       Latest Ref Rng & Units 08/28/2021  Labs for ITP Cardiac and Pulmonary Rehab  Cholestrol 0 - 200 mg/dL 183   LDL (calc) 0 - 99 mg/dL 97   HDL-C >40 mg/dL 56   Trlycerides <150 mg/dL 151   Hemoglobin A1c 4.8 - 5.6 % 5.9      Exercise Target Goals: Exercise Program Goal: Individual exercise prescription set using results from initial 6 min walk test and THRR while considering  patient's activity barriers and safety.   Exercise Prescription Goal: Initial exercise prescription builds to 30-45 minutes a day of aerobic activity, 2-3 days per week.  Home exercise guidelines will be given to patient during program as part of exercise prescription that the participant will acknowledge.   Education: Aerobic Exercise: - Group verbal and visual presentation on the components of exercise prescription. Introduces F.I.T.T principle from ACSM for exercise prescriptions.  Reviews F.I.T.T. principles of aerobic exercise including progression. Written material given at graduation. Flowsheet Row Cardiac Rehab from 10/11/2021 in [facility] Cardiac and Pulmonary Rehab  Education need identified 10/11/21       Education: Resistance Exercise: - Group verbal and visual presentation on the components of exercise prescription. Introduces F.I.T.T principle from ACSM for exercise prescriptions  Reviews  F.I.T.T. principles of resistance exercise including progression. Written material given at graduation.    Education: Exercise & Equipment Safety: - Individual verbal instruction and demonstration of equipment use and safety with use of the equipment. Flowsheet Row Cardiac Rehab from 10/11/2021 in [facility] Cardiac and Pulmonary Rehab  Date 10/11/21  Educator [facility]  Instruction Review Code 1- Verbalizes Understanding       Education: Exercise Physiology & General Exercise Guidelines: - Group verbal and  written instruction with models to review the exercise physiology of the cardiovascular system and associated critical values. Provides general exercise guidelines with specific guidelines to those with heart or lung disease.    Education: Flexibility, Balance, Mind/Body Relaxation: - Group verbal and visual presentation with interactive activity on the components of exercise prescription. Introduces F.I.T.T principle from ACSM for exercise prescriptions. Reviews F.I.T.T. principles of flexibility and balance exercise training including progression. Also discusses the mind body connection.  Reviews various relaxation techniques to help reduce and manage stress (i.e. Deep breathing, progressive muscle relaxation, and visualization). Balance handout provided to take home. Written material given at graduation.   Activity Barriers & Risk Stratification:  Activity Barriers & Cardiac Risk Stratification - 09/29/21 1534       Activity Barriers & Cardiac Risk Stratification   Activity Barriers Back Problems    Cardiac Risk Stratification High             6 Minute Walk:  6 Minute Walk     Row Name 10/11/21 1515         6 Minute Walk   Phase Initial     Distance 1360 feet     Walk Time 6 minutes     # of Rest Breaks 0     MPH 2.58     METS 2.98     RPE 13     Perceived Dyspnea  1     VO2 Peak 10.44     Symptoms No     Resting HR 83 bpm     Resting BP 124/62     Resting Oxygen Saturation  100 %     Exercise Oxygen Saturation  during 6 min walk 100 %     Max Ex. HR 106 bpm     Max Ex. BP 142/78     2 Minute Post BP 114/62              Oxygen Initial Assessment:   Oxygen Re-Evaluation:   Oxygen Discharge (Final Oxygen Re-Evaluation):   Initial Exercise Prescription:  Initial Exercise Prescription - 10/11/21 1500       Date of Initial Exercise RX and Referring Provider   Date 10/11/21    Referring Provider Emily Filbert      Oxygen   Maintain Oxygen Saturation  88% or higher      Treadmill   MPH 2.3    Grade 0.5    Minutes 15    METs 2.92      Recumbant Bike   Level 2    RPM 50    Minutes 15    METs 2.98      NuStep   Level 2    SPM 80    Minutes 15    METs 2.92      REL-XR   Level 2    Speed 50    Minutes 15    METs 2.92      T5 Nustep   Level 1    SPM 80    Minutes  15    METs 2.92      Biostep-RELP   Level 2    SPM 50    Minutes 15    METs 2.92      Prescription Details   Frequency (times per week) 2    Duration Progress to 30 minutes of continuous aerobic without signs/symptoms of physical distress      Intensity   THRR 40-80% of Max Heartrate 107-130    Ratings of Perceived Exertion 11-13    Perceived Dyspnea 0-4      Progression   Progression Continue to progress workloads to maintain intensity without signs/symptoms of physical distress.      Resistance Training   Training Prescription Yes    Weight 3    Reps 10-15             Perform Capillary Blood Glucose checks as needed.  Exercise Prescription Changes:   Exercise Prescription Changes     Row Name 10/11/21 1500             Response to Exercise   Blood Pressure (Admit) 124/62       Blood Pressure (Exercise) 142/78       Blood Pressure (Exit) 114/62       Heart Rate (Admit) 83 bpm       Heart Rate (Exercise) 106 bpm       Heart Rate (Exit) 84 bpm       Oxygen Saturation (Admit) 100 %       Oxygen Saturation (Exercise) 100 %       Oxygen Saturation (Exit) 100 %       Rating of Perceived Exertion (Exercise) 13       Perceived Dyspnea (Exercise) 1       Symptoms none       Comments 6 MWT results                Exercise Comments:   Exercise Goals and Review:   Exercise Goals     Row Name 10/11/21 1522             Exercise Goals   Increase Physical Activity Yes       Intervention Provide advice, education, support and counseling about physical activity/exercise needs.       Expected Outcomes Short Term: Attend  rehab on a regular basis to increase amount of physical activity.;Long Term: Add in home exercise to make exercise part of routine and to increase amount of physical activity.;Long Term: Exercising regularly at least 3-5 days a week.       Increase Strength and Stamina Yes       Intervention Develop an individualized exercise prescription for aerobic and resistive training based on initial evaluation findings, risk stratification, comorbidities and participant's personal goals.;Provide advice, education, support and counseling about physical activity/exercise needs.       Expected Outcomes Short Term: Increase workloads from initial exercise prescription for resistance, speed, and METs.;Short Term: Perform resistance training exercises routinely during rehab and add in resistance training at home;Long Term: Improve cardiorespiratory fitness, muscular endurance and strength as measured by increased METs and functional capacity (6MWT)       Able to understand and use rate of perceived exertion (RPE) scale Yes       Intervention Provide education and explanation on how to use RPE scale       Expected Outcomes Short Term: Able to use RPE daily in rehab to express subjective intensity level;Long Term:  Able to  use RPE to guide intensity level when exercising independently       Able to understand and use Dyspnea scale Yes       Intervention Provide education and explanation on how to use Dyspnea scale       Expected Outcomes Short Term: Able to use Dyspnea scale daily in rehab to express subjective sense of shortness of breath during exertion;Long Term: Able to use Dyspnea scale to guide intensity level when exercising independently       Knowledge and understanding of Target Heart Rate Range (THRR) Yes       Intervention Provide education and explanation of THRR including how the numbers were predicted and where they are located for reference       Expected Outcomes Short Term: Able to state/look up  THRR;Short Term: Able to use daily as guideline for intensity in rehab;Long Term: Able to use THRR to govern intensity when exercising independently       Able to check pulse independently Yes       Intervention Provide education and demonstration on how to check pulse in carotid and radial arteries.;Review the importance of being able to check your own pulse for safety during independent exercise       Expected Outcomes Short Term: Able to explain why pulse checking is important during independent exercise;Long Term: Able to check pulse independently and accurately       Understanding of Exercise Prescription Yes       Intervention Provide education, explanation, and written materials on patient's individual exercise prescription       Expected Outcomes Short Term: Able to explain program exercise prescription;Long Term: Able to explain home exercise prescription to exercise independently                Exercise Goals Re-Evaluation :   Discharge Exercise Prescription (Final Exercise Prescription Changes):  Exercise Prescription Changes - 10/11/21 1500       Response to Exercise   Blood Pressure (Admit) 124/62    Blood Pressure (Exercise) 142/78    Blood Pressure (Exit) 114/62    Heart Rate (Admit) 83 bpm    Heart Rate (Exercise) 106 bpm    Heart Rate (Exit) 84 bpm    Oxygen Saturation (Admit) 100 %    Oxygen Saturation (Exercise) 100 %    Oxygen Saturation (Exit) 100 %    Rating of Perceived Exertion (Exercise) 13    Perceived Dyspnea (Exercise) 1    Symptoms none    Comments 6 MWT results             Nutrition:  Target Goals: Understanding of nutrition guidelines, daily intake of sodium '1500mg'$ , cholesterol '200mg'$ , calories 30% from fat and 7% or less from saturated fats, daily to have 5 or more servings of fruits and vegetables.  Education: All About Nutrition: -Group instruction provided by verbal, written material, interactive activities, discussions, models, and  posters to present general guidelines for heart healthy nutrition including fat, fiber, MyPlate, the role of sodium in heart healthy nutrition, utilization of the nutrition label, and utilization of this knowledge for meal planning. Follow up email sent as well. Written material given at graduation. Flowsheet Row Cardiac Rehab from 10/11/2021 in [facility] Cardiac and Pulmonary Rehab  Education need identified 10/11/21       Biometrics:  Pre Biometrics - 10/11/21 1522       Pre Biometrics   Height 5' 2.75" (1.594 m)    Weight 121 lb 1.6 oz (54.9 kg)  Waist Circumference 30 inches    Hip Circumference 36.5 inches    Waist to Hip Ratio 0.82 %    BMI (Calculated) 21.62    Single Leg Stand 6.05 seconds              Nutrition Therapy Plan and Nutrition Goals:  Nutrition Therapy & Goals - 10/11/21 1525       Intervention Plan   Intervention Prescribe, educate and counsel regarding individualized specific dietary modifications aiming towards targeted core components such as weight, hypertension, lipid management, diabetes, heart failure and other comorbidities.    Expected Outcomes Short Term Goal: Understand basic principles of dietary content, such as calories, fat, sodium, cholesterol and nutrients.;Short Term Goal: A plan has been developed with personal nutrition goals set during dietitian appointment.;Long Term Goal: Adherence to prescribed nutrition plan.             Nutrition Assessments:  MEDIFICTS Score Key: ?70 Need to make dietary changes  40-70 Heart Healthy Diet ? 40 Therapeutic Level Cholesterol Diet  Flowsheet Row Cardiac Rehab from 10/11/2021 in [facility] Cardiac and Pulmonary Rehab  Picture Your Plate Total Score on Admission 85      Picture Your Plate Scores: <77 Unhealthy dietary pattern with much room for improvement. 41-50 Dietary pattern unlikely to meet recommendations for good health and room for improvement. 51-60 More healthful dietary pattern, with  some room for improvement.  >60 Healthy dietary pattern, although there may be some specific behaviors that could be improved.    Nutrition Goals Re-Evaluation:   Nutrition Goals Discharge (Final Nutrition Goals Re-Evaluation):   Psychosocial: Target Goals: Acknowledge presence or absence of significant depression and/or stress, maximize coping skills, provide positive support system. Participant is able to verbalize types and ability to use techniques and skills needed for reducing stress and depression.   Education: Stress, Anxiety, and Depression - Group verbal and visual presentation to define topics covered.  Reviews how body is impacted by stress, anxiety, and depression.  Also discusses healthy ways to reduce stress and to treat/manage anxiety and depression.  Written material given at graduation.   Education: Sleep Hygiene -Provides group verbal and written instruction about how sleep can affect your health.  Define sleep hygiene, discuss sleep cycles and impact of sleep habits. Review good sleep hygiene tips.    Initial Review & Psychosocial Screening:  Initial Psych Review & Screening - 09/29/21 1540       Initial Review   Current issues with Current Stress Concerns;Current Sleep Concerns    Source of Stress Concerns Chronic Illness      Family Dynamics   Good Support System? Yes   family     Barriers   Psychosocial barriers to participate in program There are no identifiable barriers or psychosocial needs.;The patient should benefit from training in stress management and relaxation.      Screening Interventions   Interventions Encouraged to exercise;Provide feedback about the scores to participant;To provide support and resources with identified psychosocial needs    Expected Outcomes Short Term goal: Utilizing psychosocial counselor, staff and physician to assist with identification of specific Stressors or current issues interfering with healing process. Setting  desired goal for each stressor or current issue identified.;Long Term Goal: Stressors or current issues are controlled or eliminated.;Short Term goal: Identification and review with participant of any Quality of Life or Depression concerns found by scoring the questionnaire.;Long Term goal: The participant improves quality of Life and PHQ9 Scores as seen by post scores and/or  verbalization of changes             Quality of Life Scores:   Quality of Life - 10/11/21 1527       Quality of Life   Select Quality of Life      Quality of Life Scores   Health/Function Pre 22.7 %    Socioeconomic Pre 26.14 %    Psych/Spiritual Pre 24.86 %    Family Pre 25.2 %    GLOBAL Pre 24.22 %            Scores of 19 and below usually indicate a poorer quality of life in these areas.  A difference of  2-3 points is a clinically meaningful difference.  A difference of 2-3 points in the total score of the Quality of Life Index has been associated with significant improvement in overall quality of life, self-image, physical symptoms, and general health in studies assessing change in quality of life.  PHQ-9: Review Flowsheet       10/11/2021  Depression screen PHQ 2/9  Decreased Interest 3  Down, Depressed, Hopeless 1  PHQ - 2 Score 4  Altered sleeping 1  Tired, decreased energy 1  Change in appetite 1  Feeling bad or failure about yourself  0  Trouble concentrating 1  Moving slowly or fidgety/restless 1  Suicidal thoughts 0  PHQ-9 Score 9  Difficult doing work/chores Somewhat difficult   Interpretation of Total Score  Total Score Depression Severity:  1-4 = Minimal depression, 5-9 = Mild depression, 10-14 = Moderate depression, 15-19 = Moderately severe depression, 20-27 = Severe depression   Psychosocial Evaluation and Intervention:  Psychosocial Evaluation - 09/29/21 1549       Psychosocial Evaluation & Interventions   Interventions Encouraged to exercise with the program and follow  exercise prescription;Stress management education;Relaxation education    Comments Ms. Beadle was surprised her heart was having issues and is really wanting to gain more knowledge about her heart failure diagnosis. She states she has a history of bronchitis and asthma, but never thought her heart would be giving her issues. She has changed her diet and is starting to exercise more. Her sleep has been a struggle for a while, in that she wakes up early morning and has a hard time falling back asleep. She has talked with her doctor about this and is trying to still lay there and rest to help her heart. Her family is involved in her care.    Expected Outcomes Short: attend cardiac rehab for education. Long: develop and maintain positive self care habits.    Continue Psychosocial Services  Follow up required by staff             Psychosocial Re-Evaluation:   Psychosocial Discharge (Final Psychosocial Re-Evaluation):   Vocational Rehabilitation: Provide vocational rehab assistance to qualifying candidates.   Vocational Rehab Evaluation & Intervention:  Vocational Rehab - 09/29/21 1540       Initial Vocational Rehab Evaluation & Intervention   Assessment shows need for Vocational Rehabilitation No             Education: Education Goals: Education classes will be provided on a variety of topics geared toward better understanding of heart health and risk factor modification. Participant will state understanding/return demonstration of topics presented as noted by education test scores.  Learning Barriers/Preferences:  Learning Barriers/Preferences - 09/29/21 1538       Learning Barriers/Preferences   Learning Barriers None    Learning Preferences Individual Instruction  General Cardiac Education Topics:  AED/CPR: - Group verbal and written instruction with the use of models to demonstrate the basic use of the AED with the basic ABC's of  resuscitation.   Anatomy and Cardiac Procedures: - Group verbal and visual presentation and models provide information about basic cardiac anatomy and function. Reviews the testing methods done to diagnose heart disease and the outcomes of the test results. Describes the treatment choices: Medical Management, Angioplasty, or Coronary Bypass Surgery for treating various heart conditions including Myocardial Infarction, Angina, Valve Disease, and Cardiac Arrhythmias.  Written material given at graduation.   Medication Safety: - Group verbal and visual instruction to review commonly prescribed medications for heart and lung disease. Reviews the medication, class of the drug, and side effects. Includes the steps to properly store meds and maintain the prescription regimen.  Written material given at graduation.   Intimacy: - Group verbal instruction through game format to discuss how heart and lung disease can affect sexual intimacy. Written material given at graduation..   Know Your Numbers and Heart Failure: - Group verbal and visual instruction to discuss disease risk factors for cardiac and pulmonary disease and treatment options.  Reviews associated critical values for Overweight/Obesity, Hypertension, Cholesterol, and Diabetes.  Discusses basics of heart failure: signs/symptoms and treatments.  Introduces Heart Failure Zone chart for action plan for heart failure.  Written material given at graduation.   Infection Prevention: - Provides verbal and written material to individual with discussion of infection control including proper hand washing and proper equipment cleaning during exercise session. Flowsheet Row Cardiac Rehab from 10/11/2021 in [facility] Cardiac and Pulmonary Rehab  Date 10/11/21  Educator [facility]  Instruction Review Code 1- Verbalizes Understanding       Falls Prevention: - Provides verbal and written material to individual with discussion of falls prevention and  safety. Flowsheet Row Cardiac Rehab from 10/11/2021 in [facility] Cardiac and Pulmonary Rehab  Date 10/11/21  Educator [facility]  Instruction Review Code 1- Verbalizes Understanding       Other: -Provides group and verbal instruction on various topics (see comments)   Knowledge Questionnaire Score:  Knowledge Questionnaire Score - 10/11/21 1527       Knowledge Questionnaire Score   Pre Score 24/26             Core Components/Risk Factors/Patient Goals at Admission:  Personal Goals and Risk Factors at Admission - 10/11/21 1525       Core Components/Risk Factors/Patient Goals on Admission    Weight Management Yes    Intervention Weight Management: Develop a combined nutrition and exercise program designed to reach desired caloric intake, while maintaining appropriate intake of nutrient and fiber, sodium and fats, and appropriate energy expenditure required for the weight goal.;Weight Management: Provide education and appropriate resources to help participant work on and attain dietary goals.    Admit Weight 121 lb 1.6 oz (54.9 kg)    Goal Weight: Short Term 125 lb (56.7 kg)    Goal Weight: Long Term 130 lb (59 kg)    Expected Outcomes Short Term: Continue to assess and modify interventions until short term weight is achieved;Long Term: Adherence to nutrition and physical activity/exercise program aimed toward attainment of established weight goal;Understanding recommendations for meals to include 15-35% energy as protein, 25-35% energy from fat, 35-60% energy from carbohydrates, less than '200mg'$  of dietary cholesterol, 20-35 gm of total fiber daily;Understanding of distribution of calorie intake throughout the day with the consumption of 4-5 meals/snacks;Weight Gain: Understanding of general  recommendations for a high calorie, high protein meal plan that promotes weight gain by distributing calorie intake throughout the day with the consumption for 4-5 meals, snacks, and/or supplements    Heart  Failure Yes    Intervention Provide a combined exercise and nutrition program that is supplemented with education, support and counseling about heart failure. Directed toward relieving symptoms such as shortness of breath, decreased exercise tolerance, and extremity edema.    Expected Outcomes Improve functional capacity of life;Short term: Attendance in program 2-3 days a week with increased exercise capacity. Reported lower sodium intake. Reported increased fruit and vegetable intake. Reports medication compliance.;Short term: Daily weights obtained and reported for increase. Utilizing diuretic protocols set by physician.;Long term: Adoption of self-care skills and reduction of barriers for early signs and symptoms recognition and intervention leading to self-care maintenance.    Hypertension Yes    Intervention Provide education on lifestyle modifcations including regular physical activity/exercise, weight management, moderate sodium restriction and increased consumption of fresh fruit, vegetables, and low fat dairy, alcohol moderation, and smoking cessation.;Monitor prescription use compliance.    Expected Outcomes Short Term: Continued assessment and intervention until BP is < 140/4m HG in hypertensive participants. < 130/89mHG in hypertensive participants with diabetes, heart failure or chronic kidney disease.;Long Term: Maintenance of blood pressure at goal levels.    Lipids Yes    Intervention Provide education and support for participant on nutrition & aerobic/resistive exercise along with prescribed medications to achieve LDL '70mg'$ , HDL >'40mg'$ .    Expected Outcomes Short Term: Participant states understanding of desired cholesterol values and is compliant with medications prescribed. Participant is following exercise prescription and nutrition guidelines.;Long Term: Cholesterol controlled with medications as prescribed, with individualized exercise RX and with personalized nutrition plan. Value  goals: LDL < '70mg'$ , HDL > 40 mg.             Education:Diabetes - Individual verbal and written instruction to review signs/symptoms of diabetes, desired ranges of glucose level fasting, after meals and with exercise. Acknowledge that pre and post exercise glucose checks will be done for 3 sessions at entry of program.   Core Components/Risk Factors/Patient Goals Review:    Core Components/Risk Factors/Patient Goals at Discharge (Final Review):    ITP Comments:  ITP Comments     Row Name 09/29/21 1539 10/11/21 1514         ITP Comments Initial telephone orientation completed. Diagnosis can be found in [facility] 8/16. EP orientation scheduled for Monday 10/2 at 1:30pm. Completed 6MWT and gym orientation. Initial ITP created and sent for review to Dr. MaEmily FilbertMedical Director.               Comments: initial ITP

## 2021-10-12 ENCOUNTER — Encounter: Payer: Medicare Other | Admitting: *Deleted

## 2021-10-12 DIAGNOSIS — I5022 Chronic systolic (congestive) heart failure: Secondary | ICD-10-CM

## 2021-10-12 NOTE — Progress Notes (Signed)
Daily Session Note  Patient Details  Name: Glenda May MRN: [id number] Date of Birth: [birth year] Referring Provider:   Flowsheet Row Cardiac Rehab from 10/11/2021 in [facility] Cardiac [facility]  Referring Provider Emily Filbert       Encounter Date: 10/12/2021  Check In:  Session Check In - 10/12/21 1114       Check-In   Supervising physician immediately available to respond to emergencies See telemetry face sheet for immediately available ER MD    Location [facility]-Cardiac & Pulmonary Rehab    Staff Present Coralie Keens, MS, ASCM CEP, Exercise Physiologist;Melissa Tilford Pillar, RDN, LDN;Kemiah Booz Sherryll Burger, RN BSN;Noah Tickle, BS, Exercise Physiologist    Virtual Visit No    Medication changes reported     No    Fall or balance concerns reported    No    Warm-up and Cool-down Performed on first and last piece of equipment    Resistance Training Performed Yes    VAD Patient? No    PAD/SET Patient? No      Pain Assessment   Currently in Pain? No/denies                Social History   Tobacco Use  Smoking Status Never  Smokeless Tobacco Not on file    Goals Met:  Independence with exercise equipment Exercise tolerated well No report of concerns or symptoms today Strength training completed today  Goals Unmet:  Not Applicable  Comments: First full day of exercise!  Patient was oriented to gym and equipment including functions, settings, policies, and procedures.  Patient's individual exercise prescription and treatment plan were reviewed.  All starting workloads were established based on the results of the 6 minute walk test done at initial orientation visit.  The plan for exercise progression was also introduced and progression will be customized based on patient's performance and goals.    Dr. Emily Filbert is Medical Director for [place].  Dr. Ottie Glazier is Medical Director for [facility] Pulmonary Rehabilitation.

## 2021-10-13 ENCOUNTER — Encounter: Payer: Self-pay | Admitting: *Deleted

## 2021-10-13 DIAGNOSIS — I5022 Chronic systolic (congestive) heart failure: Secondary | ICD-10-CM

## 2021-10-13 NOTE — Progress Notes (Signed)
Cardiac Individual Treatment Plan  Patient Details  Name: Glenda May MRN: [id number] Date of Birth: Jun 07, 1945 Referring Provider:   Flowsheet Row Cardiac Rehab from 10/11/2021 in [facility] Cardiac and Pulmonary Rehab  Referring Provider Emily Filbert       Initial Encounter Date:  Flowsheet Row Cardiac Rehab from 10/11/2021 in [facility] Cardiac and Pulmonary Rehab  Date 10/11/21       Visit Diagnosis: Heart failure, chronic systolic ([place])  Patient's Home Medications on Admission:  Current Outpatient Medications:    aspirin EC 81 MG tablet, Take 1 tablet (81 mg total) by mouth daily. Swallow whole., Disp: 30 tablet, Rfl: 12   calcium carbonate (OSCAL) 1500 (600 Ca) MG TABS tablet, Take 600 mg of elemental calcium by mouth 2 (two) times daily with a meal., Disp: , Rfl:    dapagliflozin propanediol (FARXIGA) 10 MG TABS tablet, Take 1 tablet (10 mg total) by mouth daily., Disp: 30 tablet, Rfl: 2   DEXILANT 60 MG capsule, Take 1 capsule by mouth daily., Disp: , Rfl:    furosemide (LASIX) 40 MG tablet, Take 1 tablet (40 mg total) by mouth daily., Disp: 30 tablet, Rfl: 3   metoprolol succinate (TOPROL-XL) 25 MG 24 hr tablet, Take 0.5 tablets (12.5 mg total) by mouth daily. (Patient taking differently: Take 25 mg by mouth daily.), Disp: 30 tablet, Rfl: 3   Multiple Vitamins-Minerals (WOMENS MULTI GUMMIES PO), Take 1 each by mouth daily., Disp: , Rfl:    nitroGLYCERIN (NITROSTAT) 0.4 MG SL tablet, Place 1 tablet (0.4 mg total) under the tongue every 5 (five) minutes as needed for chest pain., Disp: 20 tablet, Rfl: 12   simvastatin (ZOCOR) 40 MG tablet, Take 40 mg by mouth daily., Disp: , Rfl:    spironolactone (ALDACTONE) 25 MG tablet, Take 0.5 tablets (12.5 mg total) by mouth daily., Disp: 30 tablet, Rfl: 3  Past Medical History: Past Medical History:  Diagnosis Date   Allergic rhinitis    CHF (congestive heart failure) (HCC)    High cholesterol    Hypertension     Tobacco Use: Social  History   Tobacco Use  Smoking Status Never  Smokeless Tobacco Not on file    Labs: Review Flowsheet       Latest Ref Rng & Units 08/28/2021  Labs for ITP Cardiac and Pulmonary Rehab  Cholestrol 0 - 200 mg/dL 183   LDL (calc) 0 - 99 mg/dL 97   HDL-C >40 mg/dL 56   Trlycerides <150 mg/dL 151   Hemoglobin A1c 4.8 - 5.6 % 5.9      Exercise Target Goals: Exercise Program Goal: Individual exercise prescription set using results from initial 6 min walk test and THRR while considering  patient's activity barriers and safety.   Exercise Prescription Goal: Initial exercise prescription builds to 30-45 minutes a day of aerobic activity, 2-3 days per week.  Home exercise guidelines will be given to patient during program as part of exercise prescription that the participant will acknowledge.   Education: Aerobic Exercise: - Group verbal and visual presentation on the components of exercise prescription. Introduces F.I.T.T principle from ACSM for exercise prescriptions.  Reviews F.I.T.T. principles of aerobic exercise including progression. Written material given at graduation. Flowsheet Row Cardiac Rehab from 10/11/2021 in [facility] Cardiac and Pulmonary Rehab  Education need identified 10/11/21       Education: Resistance Exercise: - Group verbal and visual presentation on the components of exercise prescription. Introduces F.I.T.T principle from ACSM for exercise prescriptions  Reviews  F.I.T.T. principles of resistance exercise including progression. Written material given at graduation.    Education: Exercise & Equipment Safety: - Individual verbal instruction and demonstration of equipment use and safety with use of the equipment. Flowsheet Row Cardiac Rehab from 10/11/2021 in [facility] Cardiac and Pulmonary Rehab  Date 10/11/21  Educator [facility]  Instruction Review Code 1- Verbalizes Understanding       Education: Exercise Physiology & General Exercise Guidelines: - Group verbal and  written instruction with models to review the exercise physiology of the cardiovascular system and associated critical values. Provides general exercise guidelines with specific guidelines to those with heart or lung disease.    Education: Flexibility, Balance, Mind/Body Relaxation: - Group verbal and visual presentation with interactive activity on the components of exercise prescription. Introduces F.I.T.T principle from ACSM for exercise prescriptions. Reviews F.I.T.T. principles of flexibility and balance exercise training including progression. Also discusses the mind body connection.  Reviews various relaxation techniques to help reduce and manage stress (i.e. Deep breathing, progressive muscle relaxation, and visualization). Balance handout provided to take home. Written material given at graduation.   Activity Barriers & Risk Stratification:  Activity Barriers & Cardiac Risk Stratification - 09/29/21 1534       Activity Barriers & Cardiac Risk Stratification   Activity Barriers Back Problems    Cardiac Risk Stratification High             6 Minute Walk:  6 Minute Walk     Row Name 10/11/21 1515         6 Minute Walk   Phase Initial     Distance 1360 feet     Walk Time 6 minutes     # of Rest Breaks 0     MPH 2.58     METS 2.98     RPE 13     Perceived Dyspnea  1     VO2 Peak 10.44     Symptoms No     Resting HR 83 bpm     Resting BP 124/62     Resting Oxygen Saturation  100 %     Exercise Oxygen Saturation  during 6 min walk 100 %     Max Ex. HR 106 bpm     Max Ex. BP 142/78     2 Minute Post BP 114/62              Oxygen Initial Assessment:   Oxygen Re-Evaluation:   Oxygen Discharge (Final Oxygen Re-Evaluation):   Initial Exercise Prescription:  Initial Exercise Prescription - 10/11/21 1500       Date of Initial Exercise RX and Referring Provider   Date 10/11/21    Referring Provider Emily Filbert      Oxygen   Maintain Oxygen Saturation  88% or higher      Treadmill   MPH 2.3    Grade 0.5    Minutes 15    METs 2.92      Recumbant Bike   Level 2    RPM 50    Minutes 15    METs 2.98      NuStep   Level 2    SPM 80    Minutes 15    METs 2.92      REL-XR   Level 2    Speed 50    Minutes 15    METs 2.92      T5 Nustep   Level 1    SPM 80    Minutes  15    METs 2.92      Biostep-RELP   Level 2    SPM 50    Minutes 15    METs 2.92      Prescription Details   Frequency (times per week) 2    Duration Progress to 30 minutes of continuous aerobic without signs/symptoms of physical distress      Intensity   THRR 40-80% of Max Heartrate 107-130    Ratings of Perceived Exertion 11-13    Perceived Dyspnea 0-4      Progression   Progression Continue to progress workloads to maintain intensity without signs/symptoms of physical distress.      Resistance Training   Training Prescription Yes    Weight 3    Reps 10-15             Perform Capillary Blood Glucose checks as needed.  Exercise Prescription Changes:   Exercise Prescription Changes     Row Name 10/11/21 1500             Response to Exercise   Blood Pressure (Admit) 124/62       Blood Pressure (Exercise) 142/78       Blood Pressure (Exit) 114/62       Heart Rate (Admit) 83 bpm       Heart Rate (Exercise) 106 bpm       Heart Rate (Exit) 84 bpm       Oxygen Saturation (Admit) 100 %       Oxygen Saturation (Exercise) 100 %       Oxygen Saturation (Exit) 100 %       Rating of Perceived Exertion (Exercise) 13       Perceived Dyspnea (Exercise) 1       Symptoms none       Comments 6 MWT results                Exercise Comments:   Exercise Comments     Row Name 10/12/21 1117           Exercise Comments First full day of exercise!  Patient was oriented to gym and equipment including functions, settings, policies, and procedures.  Patient's individual exercise prescription and treatment plan were reviewed.  All  starting workloads were established based on the results of the 6 minute walk test done at initial orientation visit.  The plan for exercise progression was also introduced and progression will be customized based on patient's performance and goals                Exercise Goals and Review:   Exercise Goals     Row Name 10/11/21 1522             Exercise Goals   Increase Physical Activity Yes       Intervention Provide advice, education, support and counseling about physical activity/exercise needs.       Expected Outcomes Short Term: Attend rehab on a regular basis to increase amount of physical activity.;Long Term: Add in home exercise to make exercise part of routine and to increase amount of physical activity.;Long Term: Exercising regularly at least 3-5 days a week.       Increase Strength and Stamina Yes       Intervention Develop an individualized exercise prescription for aerobic and resistive training based on initial evaluation findings, risk stratification, comorbidities and participant's personal goals.;Provide advice, education, support and counseling about physical activity/exercise needs.       Expected  Outcomes Short Term: Increase workloads from initial exercise prescription for resistance, speed, and METs.;Short Term: Perform resistance training exercises routinely during rehab and add in resistance training at home;Long Term: Improve cardiorespiratory fitness, muscular endurance and strength as measured by increased METs and functional capacity (6MWT)       Able to understand and use rate of perceived exertion (RPE) scale Yes       Intervention Provide education and explanation on how to use RPE scale       Expected Outcomes Short Term: Able to use RPE daily in rehab to express subjective intensity level;Long Term:  Able to use RPE to guide intensity level when exercising independently       Able to understand and use Dyspnea scale Yes       Intervention Provide  education and explanation on how to use Dyspnea scale       Expected Outcomes Short Term: Able to use Dyspnea scale daily in rehab to express subjective sense of shortness of breath during exertion;Long Term: Able to use Dyspnea scale to guide intensity level when exercising independently       Knowledge and understanding of Target Heart Rate Range (THRR) Yes       Intervention Provide education and explanation of THRR including how the numbers were predicted and where they are located for reference       Expected Outcomes Short Term: Able to state/look up THRR;Short Term: Able to use daily as guideline for intensity in rehab;Long Term: Able to use THRR to govern intensity when exercising independently       Able to check pulse independently Yes       Intervention Provide education and demonstration on how to check pulse in carotid and radial arteries.;Review the importance of being able to check your own pulse for safety during independent exercise       Expected Outcomes Short Term: Able to explain why pulse checking is important during independent exercise;Long Term: Able to check pulse independently and accurately       Understanding of Exercise Prescription Yes       Intervention Provide education, explanation, and written materials on patient's individual exercise prescription       Expected Outcomes Short Term: Able to explain program exercise prescription;Long Term: Able to explain home exercise prescription to exercise independently                Exercise Goals Re-Evaluation :  Exercise Goals Re-Evaluation     Row Name 10/12/21 1117             Exercise Goal Re-Evaluation   Exercise Goals Review Increase Physical Activity;Able to understand and use rate of perceived exertion (RPE) scale;Knowledge and understanding of Target Heart Rate Range (THRR);Understanding of Exercise Prescription;Increase Strength and Stamina;Able to check pulse independently       Comments Reviewed RPE  and dyspnea scales, THR and program prescription with pt today.  Pt voiced understanding and was given a copy of goals to take home.       Expected Outcomes Short: Use RPE daily to regulate intensity. Long: Follow program prescription in THR.                Discharge Exercise Prescription (Final Exercise Prescription Changes):  Exercise Prescription Changes - 10/11/21 1500       Response to Exercise   Blood Pressure (Admit) 124/62    Blood Pressure (Exercise) 142/78    Blood Pressure (Exit) 114/62    Heart Rate (  Admit) 83 bpm    Heart Rate (Exercise) 106 bpm    Heart Rate (Exit) 84 bpm    Oxygen Saturation (Admit) 100 %    Oxygen Saturation (Exercise) 100 %    Oxygen Saturation (Exit) 100 %    Rating of Perceived Exertion (Exercise) 13    Perceived Dyspnea (Exercise) 1    Symptoms none    Comments 6 MWT results             Nutrition:  Target Goals: Understanding of nutrition guidelines, daily intake of sodium '1500mg'$ , cholesterol '200mg'$ , calories 30% from fat and 7% or less from saturated fats, daily to have 5 or more servings of fruits and vegetables.  Education: All About Nutrition: -Group instruction provided by verbal, written material, interactive activities, discussions, models, and posters to present general guidelines for heart healthy nutrition including fat, fiber, MyPlate, the role of sodium in heart healthy nutrition, utilization of the nutrition label, and utilization of this knowledge for meal planning. Follow up email sent as well. Written material given at graduation. Flowsheet Row Cardiac Rehab from 10/11/2021 in [facility] Cardiac and Pulmonary Rehab  Education need identified 10/11/21       Biometrics:  Pre Biometrics - 10/11/21 1522       Pre Biometrics   Height 5' 2.75" (1.594 m)    Weight 121 lb 1.6 oz (54.9 kg)    Waist Circumference 30 inches    Hip Circumference 36.5 inches    Waist to Hip Ratio 0.82 %    BMI (Calculated) 21.62    Single Leg  Stand 6.05 seconds              Nutrition Therapy Plan and Nutrition Goals:  Nutrition Therapy & Goals - 10/11/21 1525       Intervention Plan   Intervention Prescribe, educate and counsel regarding individualized specific dietary modifications aiming towards targeted core components such as weight, hypertension, lipid management, diabetes, heart failure and other comorbidities.    Expected Outcomes Short Term Goal: Understand basic principles of dietary content, such as calories, fat, sodium, cholesterol and nutrients.;Short Term Goal: A plan has been developed with personal nutrition goals set during dietitian appointment.;Long Term Goal: Adherence to prescribed nutrition plan.             Nutrition Assessments:  MEDIFICTS Score Key: ?70 Need to make dietary changes  40-70 Heart Healthy Diet ? 40 Therapeutic Level Cholesterol Diet  Flowsheet Row Cardiac Rehab from 10/11/2021 in [facility] Cardiac and Pulmonary Rehab  Picture Your Plate Total Score on Admission 85      Picture Your Plate Scores: <29 Unhealthy dietary pattern with much room for improvement. 41-50 Dietary pattern unlikely to meet recommendations for good health and room for improvement. 51-60 More healthful dietary pattern, with some room for improvement.  >60 Healthy dietary pattern, although there may be some specific behaviors that could be improved.    Nutrition Goals Re-Evaluation:   Nutrition Goals Discharge (Final Nutrition Goals Re-Evaluation):   Psychosocial: Target Goals: Acknowledge presence or absence of significant depression and/or stress, maximize coping skills, provide positive support system. Participant is able to verbalize types and ability to use techniques and skills needed for reducing stress and depression.   Education: Stress, Anxiety, and Depression - Group verbal and visual presentation to define topics covered.  Reviews how body is impacted by stress, anxiety, and depression.   Also discusses healthy ways to reduce stress and to treat/manage anxiety and depression.  Written material given  at graduation.   Education: Sleep Hygiene -Provides group verbal and written instruction about how sleep can affect your health.  Define sleep hygiene, discuss sleep cycles and impact of sleep habits. Review good sleep hygiene tips.    Initial Review & Psychosocial Screening:  Initial Psych Review & Screening - 09/29/21 1540       Initial Review   Current issues with Current Stress Concerns;Current Sleep Concerns    Source of Stress Concerns Chronic Illness      Family Dynamics   Good Support System? Yes   family     Barriers   Psychosocial barriers to participate in program There are no identifiable barriers or psychosocial needs.;The patient should benefit from training in stress management and relaxation.      Screening Interventions   Interventions Encouraged to exercise;Provide feedback about the scores to participant;To provide support and resources with identified psychosocial needs    Expected Outcomes Short Term goal: Utilizing psychosocial counselor, staff and physician to assist with identification of specific Stressors or current issues interfering with healing process. Setting desired goal for each stressor or current issue identified.;Long Term Goal: Stressors or current issues are controlled or eliminated.;Short Term goal: Identification and review with participant of any Quality of Life or Depression concerns found by scoring the questionnaire.;Long Term goal: The participant improves quality of Life and PHQ9 Scores as seen by post scores and/or verbalization of changes             Quality of Life Scores:   Quality of Life - 10/11/21 1527       Quality of Life   Select Quality of Life      Quality of Life Scores   Health/Function Pre 22.7 %    Socioeconomic Pre 26.14 %    Psych/Spiritual Pre 24.86 %    Family Pre 25.2 %    GLOBAL Pre 24.22 %             Scores of 19 and below usually indicate a poorer quality of life in these areas.  A difference of  2-3 points is a clinically meaningful difference.  A difference of 2-3 points in the total score of the Quality of Life Index has been associated with significant improvement in overall quality of life, self-image, physical symptoms, and general health in studies assessing change in quality of life.  PHQ-9: Review Flowsheet       10/11/2021  Depression screen PHQ 2/9  Decreased Interest 3  Down, Depressed, Hopeless 1  PHQ - 2 Score 4  Altered sleeping 1  Tired, decreased energy 1  Change in appetite 1  Feeling bad or failure about yourself  0  Trouble concentrating 1  Moving slowly or fidgety/restless 1  Suicidal thoughts 0  PHQ-9 Score 9  Difficult doing work/chores Somewhat difficult   Interpretation of Total Score  Total Score Depression Severity:  1-4 = Minimal depression, 5-9 = Mild depression, 10-14 = Moderate depression, 15-19 = Moderately severe depression, 20-27 = Severe depression   Psychosocial Evaluation and Intervention:  Psychosocial Evaluation - 09/29/21 1549       Psychosocial Evaluation & Interventions   Interventions Encouraged to exercise with the program and follow exercise prescription;Stress management education;Relaxation education    Comments Ms. Joo was surprised her heart was having issues and is really wanting to gain more knowledge about her heart failure diagnosis. She states she has a history of bronchitis and asthma, but never thought her heart would be giving her issues.  She has changed her diet and is starting to exercise more. Her sleep has been a struggle for a while, in that she wakes up early morning and has a hard time falling back asleep. She has talked with her doctor about this and is trying to still lay there and rest to help her heart. Her family is involved in her care.    Expected Outcomes Short: attend cardiac rehab for  education. Long: develop and maintain positive self care habits.    Continue Psychosocial Services  Follow up required by staff             Psychosocial Re-Evaluation:   Psychosocial Discharge (Final Psychosocial Re-Evaluation):   Vocational Rehabilitation: Provide vocational rehab assistance to qualifying candidates.   Vocational Rehab Evaluation & Intervention:  Vocational Rehab - 09/29/21 1540       Initial Vocational Rehab Evaluation & Intervention   Assessment shows need for Vocational Rehabilitation No             Education: Education Goals: Education classes will be provided on a variety of topics geared toward better understanding of heart health and risk factor modification. Participant will state understanding/return demonstration of topics presented as noted by education test scores.  Learning Barriers/Preferences:  Learning Barriers/Preferences - 09/29/21 1538       Learning Barriers/Preferences   Learning Barriers None    Learning Preferences Individual Instruction             General Cardiac Education Topics:  AED/CPR: - Group verbal and written instruction with the use of models to demonstrate the basic use of the AED with the basic ABC's of resuscitation.   Anatomy and Cardiac Procedures: - Group verbal and visual presentation and models provide information about basic cardiac anatomy and function. Reviews the testing methods done to diagnose heart disease and the outcomes of the test results. Describes the treatment choices: Medical Management, Angioplasty, or Coronary Bypass Surgery for treating various heart conditions including Myocardial Infarction, Angina, Valve Disease, and Cardiac Arrhythmias.  Written material given at graduation.   Medication Safety: - Group verbal and visual instruction to review commonly prescribed medications for heart and lung disease. Reviews the medication, class of the drug, and side effects. Includes the  steps to properly store meds and maintain the prescription regimen.  Written material given at graduation.   Intimacy: - Group verbal instruction through game format to discuss how heart and lung disease can affect sexual intimacy. Written material given at graduation..   Know Your Numbers and Heart Failure: - Group verbal and visual instruction to discuss disease risk factors for cardiac and pulmonary disease and treatment options.  Reviews associated critical values for Overweight/Obesity, Hypertension, Cholesterol, and Diabetes.  Discusses basics of heart failure: signs/symptoms and treatments.  Introduces Heart Failure Zone chart for action plan for heart failure.  Written material given at graduation.   Infection Prevention: - Provides verbal and written material to individual with discussion of infection control including proper hand washing and proper equipment cleaning during exercise session. Flowsheet Row Cardiac Rehab from 10/11/2021 in [facility] Cardiac and Pulmonary Rehab  Date 10/11/21  Educator [facility]  Instruction Review Code 1- Verbalizes Understanding       Falls Prevention: - Provides verbal and written material to individual with discussion of falls prevention and safety. Flowsheet Row Cardiac Rehab from 10/11/2021 in [facility] Cardiac and Pulmonary Rehab  Date 10/11/21  Educator [facility]  Instruction Review Code 1- Verbalizes Understanding       Other: -  Provides group and verbal instruction on various topics (see comments)   Knowledge Questionnaire Score:  Knowledge Questionnaire Score - 10/11/21 1527       Knowledge Questionnaire Score   Pre Score 24/26             Core Components/Risk Factors/Patient Goals at Admission:  Personal Goals and Risk Factors at Admission - 10/11/21 1525       Core Components/Risk Factors/Patient Goals on Admission    Weight Management Yes    Intervention Weight Management: Develop a combined nutrition and exercise program designed to  reach desired caloric intake, while maintaining appropriate intake of nutrient and fiber, sodium and fats, and appropriate energy expenditure required for the weight goal.;Weight Management: Provide education and appropriate resources to help participant work on and attain dietary goals.    Admit Weight 121 lb 1.6 oz (54.9 kg)    Goal Weight: Short Term 125 lb (56.7 kg)    Goal Weight: Long Term 130 lb (59 kg)    Expected Outcomes Short Term: Continue to assess and modify interventions until short term weight is achieved;Long Term: Adherence to nutrition and physical activity/exercise program aimed toward attainment of established weight goal;Understanding recommendations for meals to include 15-35% energy as protein, 25-35% energy from fat, 35-60% energy from carbohydrates, less than '200mg'$  of dietary cholesterol, 20-35 gm of total fiber daily;Understanding of distribution of calorie intake throughout the day with the consumption of 4-5 meals/snacks;Weight Gain: Understanding of general recommendations for a high calorie, high protein meal plan that promotes weight gain by distributing calorie intake throughout the day with the consumption for 4-5 meals, snacks, and/or supplements    Heart Failure Yes    Intervention Provide a combined exercise and nutrition program that is supplemented with education, support and counseling about heart failure. Directed toward relieving symptoms such as shortness of breath, decreased exercise tolerance, and extremity edema.    Expected Outcomes Improve functional capacity of life;Short term: Attendance in program 2-3 days a week with increased exercise capacity. Reported lower sodium intake. Reported increased fruit and vegetable intake. Reports medication compliance.;Short term: Daily weights obtained and reported for increase. Utilizing diuretic protocols set by physician.;Long term: Adoption of self-care skills and reduction of barriers for early signs and symptoms  recognition and intervention leading to self-care maintenance.    Hypertension Yes    Intervention Provide education on lifestyle modifcations including regular physical activity/exercise, weight management, moderate sodium restriction and increased consumption of fresh fruit, vegetables, and low fat dairy, alcohol moderation, and smoking cessation.;Monitor prescription use compliance.    Expected Outcomes Short Term: Continued assessment and intervention until BP is < 140/74m HG in hypertensive participants. < 130/843mHG in hypertensive participants with diabetes, heart failure or chronic kidney disease.;Long Term: Maintenance of blood pressure at goal levels.    Lipids Yes    Intervention Provide education and support for participant on nutrition & aerobic/resistive exercise along with prescribed medications to achieve LDL '70mg'$ , HDL >'40mg'$ .    Expected Outcomes Short Term: Participant states understanding of desired cholesterol values and is compliant with medications prescribed. Participant is following exercise prescription and nutrition guidelines.;Long Term: Cholesterol controlled with medications as prescribed, with individualized exercise RX and with personalized nutrition plan. Value goals: LDL < '70mg'$ , HDL > 40 mg.             Education:Diabetes - Individual verbal and written instruction to review signs/symptoms of diabetes, desired ranges of glucose level fasting, after meals and with exercise. Acknowledge that pre  and post exercise glucose checks will be done for 3 sessions at entry of program.   Core Components/Risk Factors/Patient Goals Review:    Core Components/Risk Factors/Patient Goals at Discharge (Final Review):    ITP Comments:  ITP Comments     Row Name 09/29/21 1539 10/11/21 1514 10/12/21 1116 10/13/21 0704     ITP Comments Initial telephone orientation completed. Diagnosis can be found in [facility] 8/16. EP orientation scheduled for Monday 10/2 at 1:30pm. Completed  6MWT and gym orientation. Initial ITP created and sent for review to Dr. Emily Filbert, Medical Director. First full day of exercise!  Patient was oriented to gym and equipment including functions, settings, policies, and procedures.  Patient's individual exercise prescription and treatment plan were reviewed.  All starting workloads were established based on the results of the 6 minute walk test done at initial orientation visit.  The plan for exercise progression was also introduced and progression will be customized based on patient's performance and goals 30 Day review completed. Medical Director ITP review done, changes made as directed, and signed approval by Medical Director.   New to program             Comments:

## 2021-10-14 ENCOUNTER — Ambulatory Visit: Payer: Medicare Other | Attending: Family | Admitting: Family

## 2021-10-14 ENCOUNTER — Encounter: Payer: Self-pay | Admitting: Family

## 2021-10-14 VITALS — BP 121/65 | HR 85 | Resp 14 | Ht 62.0 in | Wt 122.5 lb

## 2021-10-14 DIAGNOSIS — F5101 Primary insomnia: Secondary | ICD-10-CM | POA: Diagnosis not present

## 2021-10-14 DIAGNOSIS — R911 Solitary pulmonary nodule: Secondary | ICD-10-CM

## 2021-10-14 DIAGNOSIS — I10 Essential (primary) hypertension: Secondary | ICD-10-CM

## 2021-10-14 DIAGNOSIS — E785 Hyperlipidemia, unspecified: Secondary | ICD-10-CM | POA: Insufficient documentation

## 2021-10-14 DIAGNOSIS — R918 Other nonspecific abnormal finding of lung field: Secondary | ICD-10-CM | POA: Insufficient documentation

## 2021-10-14 DIAGNOSIS — X58XXXA Exposure to other specified factors, initial encounter: Secondary | ICD-10-CM | POA: Insufficient documentation

## 2021-10-14 DIAGNOSIS — I5022 Chronic systolic (congestive) heart failure: Secondary | ICD-10-CM | POA: Diagnosis not present

## 2021-10-14 DIAGNOSIS — Z79899 Other long term (current) drug therapy: Secondary | ICD-10-CM | POA: Diagnosis not present

## 2021-10-14 DIAGNOSIS — G4700 Insomnia, unspecified: Secondary | ICD-10-CM | POA: Diagnosis not present

## 2021-10-14 DIAGNOSIS — Z7984 Long term (current) use of oral hypoglycemic drugs: Secondary | ICD-10-CM | POA: Diagnosis not present

## 2021-10-14 DIAGNOSIS — T783XXA Angioneurotic edema, initial encounter: Secondary | ICD-10-CM | POA: Insufficient documentation

## 2021-10-14 DIAGNOSIS — I110 Hypertensive heart disease with heart failure: Secondary | ICD-10-CM | POA: Insufficient documentation

## 2021-10-14 DIAGNOSIS — I2510 Atherosclerotic heart disease of native coronary artery without angina pectoris: Secondary | ICD-10-CM | POA: Diagnosis not present

## 2021-10-14 NOTE — Patient Instructions (Addendum)
Continue weighing daily and call for an overnight weight gain of 3 pounds or more or a weekly weight gain of more than 5 pounds.   If you have voicemail, please make sure your mailbox is cleaned out so that we may leave a message and please make sure to listen to any voicemails.    If you receive a satisfaction survey regarding the [facility], please take the time to fill it out. This way we can continue to provide excellent care and make any changes that need to be made.    Call us in the future if you need us for anything 

## 2021-10-14 NOTE — Progress Notes (Signed)
Patient ID: Glenda May, female    DOB: 1945/05/11, 76 y.o.   MRN: [id number]  HPI  Glenda May is a 76 y/o female with a history of hyperlipidemia, HTN, pulmonary nodules and chronic heart failure.    Echo report from 08/26/21 reviewed and showed an EF of 20-25% along with mildly elevated PA pressure and moderate MR.   RHC/LHC done 08/30/21 and showed: Mild, nonobstructive coronary artery disease with 10-20% proximal LAD and 20% ostial RCA stenoses.  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 Normal left heart, right heart, and pulmonary artery pressures. Normal Fick cardiac output/index.  Admitted 08/25/21 due to chest pain/tightness with worsening SOB. Initially given IV lasix with transition to oral diuretics. Cardiology consult obtained. Echo and cath done. Right-sided thoracentesis done with removal of 319m. Multiple pulmonary nodules as noted on CT scan. PT/OT evaluations done. Discharged after 6 days.   She presents today for a follow-up visit with a chief complaint of minimal fatigue upon moderate exertion. She describes this as chronic in nature. She has associated shortness of breath, light-headedness and chronic difficulty sleeping along with this. She denies any cough, chest pain, pedal edema, palpitations, abdominal distention or weight gain.   She is participating in cardiac rehab. Scheduled for cardiac MRI January 2024.   She would like to try taking melatonin at bedtime to see if that helps her sleep. She generally goes to sleep at 10pm and then wakes up between 3:30-4:00am. Sometimes feels rested but usually doesn't.   Past Medical History:  Diagnosis Date   Allergic rhinitis    CHF (congestive heart failure) (HCC)    High cholesterol    Hypertension    Past Surgical History:  Procedure Laterality Date   APPENDECTOMY     CHOLECYSTECTOMY     LUMBAR LAMINECTOMY     L4-5   RIGHT/LEFT HEART CATH AND CORONARY ANGIOGRAPHY N/A 08/30/2021   Procedure:  RIGHT/LEFT HEART CATH AND CORONARY ANGIOGRAPHY;  Surgeon: ENelva Bush MD;  Location: [facility];  Service: Cardiovascular;  Laterality: N/A;   TONSILLECTOMY     Family History  Problem Relation Age of Onset   Stroke Mother        died @ 982  Dementia Mother    Stroke Father        died @ 647  Stroke Sister    Heart attack Brother        died @ 872  Breast cancer Neg Hx    Social History   Tobacco Use   Smoking status: Never   Smokeless tobacco: Not on file  Substance Use Topics   Alcohol use: Not Currently   Allergies  Allergen Reactions   Ace Inhibitors Other (See Comments)    Angioedema/ per patient.    Prior to Admission medications   Medication Sig Start Date End Date Taking? Authorizing Provider  albuterol (VENTOLIN HFA) 108 (90 Base) MCG/ACT inhaler Inhale 2 puffs into the lungs every 6 (six) hours as needed. 09/16/21 09/16/22 Yes [provider]  aspirin EC 81 MG tablet Take 1 tablet (81 mg total) by mouth daily. Swallow whole. 09/01/21  Yes PFritzi Mandes MD  calcium carbonate (OSCAL) 1500 (600 Ca) MG TABS tablet Take 600 mg of elemental calcium by mouth 2 (two) times daily with a meal.   Yes [provider]  dapagliflozin propanediol (FARXIGA) 10 MG TABS tablet Take 1 tablet (10 mg total) by mouth daily. 09/01/21  Yes PFritzi Mandes MD  DRoss Marcus  60 MG capsule Take 1 capsule by mouth daily. 08/17/21  Yes [provider]  furosemide (LASIX) 40 MG tablet Take 1 tablet (40 mg total) by mouth daily. 09/01/21  Yes Fritzi Mandes, MD  loratadine (CLARITIN) 10 MG tablet Take 10 mg by mouth daily.   Yes [provider]  metoprolol succinate (TOPROL-XL) 25 MG 24 hr tablet Take 0.5 tablets (12.5 mg total) by mouth daily. Patient taking differently: Take 25 mg by mouth daily. 09/01/21  Yes Fritzi Mandes, MD  Multiple Vitamins-Minerals (WOMENS MULTI GUMMIES PO) Take 1 each by mouth daily.   Yes [provider]  rosuvastatin (CRESTOR) 20 MG  tablet Take 20 mg by mouth daily. 09/30/21  Yes [provider]  spironolactone (ALDACTONE) 25 MG tablet Take 0.5 tablets (12.5 mg total) by mouth daily. 09/01/21  Yes Fritzi Mandes, MD   Review of Systems  Constitutional:  Positive for fatigue. Negative for appetite change.  HENT:  Negative for congestion, postnasal drip and sore throat.   Eyes: Negative.   Respiratory:  Positive for shortness of breath. Negative for cough and chest tightness.   Cardiovascular:  Negative for chest pain, palpitations and leg swelling.  Gastrointestinal:  Negative for abdominal distention and abdominal pain.  Endocrine: Negative.   Genitourinary: Negative.   Musculoskeletal:  Negative for back pain and neck pain.  Skin: Negative.   Allergic/Immunologic: Negative.   Neurological:  Positive for light-headedness (with sudden position changes). Negative for dizziness.  Hematological:  Negative for adenopathy. Does not bruise/bleed easily.  Psychiatric/Behavioral:  Positive for sleep disturbance (chronic; sleeping on 2 pillows). Negative for dysphoric mood. The patient is not nervous/anxious.    Vitals:   10/14/21 1101  BP: 121/65  Pulse: 85  Resp: 14  SpO2: 100%  Weight: 122 lb 8 oz (55.6 kg)  Height: '5\' 2"'$  (1.575 m)   Wt Readings from Last 3 Encounters:  10/14/21 122 lb 8 oz (55.6 kg)  10/11/21 121 lb 1.6 oz (54.9 kg)  09/15/21 122 lb 6 oz (55.5 kg)   Lab Results  Component Value Date   CREATININE 0.69 08/31/2021   CREATININE 1.06 (H) 08/30/2021   CREATININE 0.83 08/29/2021   Physical Exam Vitals and nursing note reviewed.  Constitutional:      Appearance: Normal appearance.  HENT:     Head: Normocephalic and atraumatic.  Cardiovascular:     Rate and Rhythm: Normal rate and regular rhythm.  Pulmonary:     Effort: Pulmonary effort is normal. No respiratory distress.     Breath sounds: No wheezing or rales.  Abdominal:     General: There is no distension.     Palpations: Abdomen is  soft.     Tenderness: There is no abdominal tenderness.  Musculoskeletal:        General: No tenderness.     Cervical back: Normal range of motion and neck supple.     Right lower leg: No edema.     Left lower leg: No edema.  Skin:    General: Skin is warm and dry.  Neurological:     General: No focal deficit present.     Mental Status: She is alert and oriented to person, place, and time.  Psychiatric:        Mood and Affect: Mood normal.        Behavior: Behavior normal.        Thought Content: Thought content normal.   Assessment & Plan:  1: Chronic heart failure with reduced ejection  fraction- - NYHA class II - euvolemic today - weighing daily; reminded to call for an overnight weight gain of > 2 pounds or a weekly weight gain of > 5 pounds - weight stable from last visit here 1 month ago - saw cardiology Sharlett Iles) 09/30/21 - has cardiac MRI scheduled for 01/11/22 - not adding any salt to her food and has been reading food labels for sodium content - on GDMT of farxiga, metoprolol and spironolactone - had angioedema with ACE-i - participating in cardiac rehab - now on rosuvastatin '20mg'$  daily - BNP 08/26/21 was 2082.8  2: HTN- - BP looks good (121/65) - saw PCP Sabra Heck) 09/16/21 - BMP 09/07/21 reviewed and showed sodium 135, potassium 4.6, creatinine 1.0 and GFR 65  3: Pulmonary nodules- - noted on CT - repeat CT in 6 months - saw pulmonology Lanney Gins) 09/16/21  4: Insomnia- - advised that she could try taking '5mg'$  melatonin nightly   Medication bottles reviewed.

## 2021-10-18 ENCOUNTER — Other Ambulatory Visit: Payer: Self-pay | Admitting: Family

## 2021-10-18 MED ORDER — METOPROLOL SUCCINATE ER 25 MG PO TB24: 25.0000 mg | ORAL_TABLET | Freq: Every day | ORAL | 3 refills | Status: AC

## 2021-10-19 ENCOUNTER — Encounter: Payer: Medicare Other | Admitting: *Deleted

## 2021-10-19 DIAGNOSIS — I5022 Chronic systolic (congestive) heart failure: Secondary | ICD-10-CM | POA: Diagnosis not present

## 2021-10-19 NOTE — Progress Notes (Signed)
Daily Session Note  Patient Details  Name: Glenda May MRN: [id number] Date of Birth: [birth year] Referring Provider:   Flowsheet Row Cardiac Rehab from 10/11/2021 in [facility] Cardiac and Pulmonary Rehab  Referring Provider Emily Filbert       Encounter Date: 10/19/2021  Check In:  Session Check In - 10/19/21 1125       Check-In   Supervising physician immediately available to respond to emergencies See telemetry face sheet for immediately available ER MD    Location [facility]-Cardiac & Pulmonary Rehab    Staff Present Alberteen Sam, MA, RCEP, CCRP, Marylynn Pearson, MS, ASCM CEP, Exercise Physiologist;Tacarra Justo Sherryll Burger, RN BSN    Virtual Visit No    Medication changes reported     No    Fall or balance concerns reported    No    Warm-up and Cool-down Performed on first and last piece of equipment    Resistance Training Performed Yes    VAD Patient? No    PAD/SET Patient? No      Pain Assessment   Currently in Pain? No/denies                Social History   Tobacco Use  Smoking Status Never  Smokeless Tobacco Not on file    Goals Met:  Independence with exercise equipment Exercise tolerated well No report of concerns or symptoms today Strength training completed today  Goals Unmet:  Not Applicable  Comments: Pt able to follow exercise prescription today without complaint.  Will continue to monitor for progression.'   Dr. Emily Filbert is Medical Director for [place].  Dr. Ottie Glazier is Medical Director for [facility] Pulmonary Rehabilitation.

## 2021-10-21 ENCOUNTER — Encounter: Payer: Medicare Other | Admitting: *Deleted

## 2021-10-21 DIAGNOSIS — I5022 Chronic systolic (congestive) heart failure: Secondary | ICD-10-CM | POA: Diagnosis not present

## 2021-10-21 NOTE — Progress Notes (Signed)
Daily Session Note  Patient Details  Name: Glenda May MRN: [id number] Date of Birth: October 20, 1945 Referring Provider:   Flowsheet Row Cardiac Rehab from 10/11/2021 in [facility] Cardiac and Pulmonary Rehab  Referring Provider Emily Filbert       Encounter Date: 10/21/2021  Check In:  Session Check In - 10/21/21 1113       Check-In   Supervising physician immediately available to respond to emergencies See telemetry face sheet for immediately available ER MD    Staff Present Alberteen Sam, MA, RCEP, CCRP, CCET;Joseph Morton, [place], RN, [place]    Virtual Visit No    Medication changes reported     No    Fall or balance concerns reported    No    Warm-up and Cool-down Performed on first and last piece of equipment    Resistance Training Performed Yes    VAD Patient? No    PAD/SET Patient? No      Pain Assessment   Currently in Pain? No/denies                Social History   Tobacco Use  Smoking Status Never  Smokeless Tobacco Not on file    Goals Met:  Independence with exercise equipment Exercise tolerated well No report of concerns or symptoms today Strength training completed today  Goals Unmet:  Not Applicable  Comments: Pt able to follow exercise prescription today without complaint.  Will continue to monitor for progression.    Dr. Emily Filbert is Medical Director for [place].  Dr. Ottie Glazier is Medical Director for [facility] Pulmonary Rehabilitation.

## 2021-10-26 ENCOUNTER — Encounter: Payer: Medicare Other | Admitting: *Deleted

## 2021-10-26 DIAGNOSIS — I5022 Chronic systolic (congestive) heart failure: Secondary | ICD-10-CM | POA: Diagnosis not present

## 2021-10-26 NOTE — Progress Notes (Signed)
Daily Session Note  Patient Details  Name: Glenda May MRN: [id number] Date of Birth: Jan 26, 1945 Referring Provider:   Flowsheet Row Cardiac Rehab from 10/11/2021 in [facility] Cardiac and Pulmonary Rehab  Referring Provider Emily Filbert       Encounter Date: 10/26/2021  Check In:  Session Check In - 10/26/21 1112       Check-In   Supervising physician immediately available to respond to emergencies See telemetry face sheet for immediately available ER MD    Location [facility]-Cardiac & Pulmonary Rehab    Staff Present Renita Papa, RN BSN;Jessica Juarez, MA, RCEP, CCRP, Marylynn Pearson, MS, ASCM CEP, Exercise Physiologist;Melissa Caiola, RDN, LDN    Virtual Visit No    Medication changes reported     No    Fall or balance concerns reported    No    Warm-up and Cool-down Performed on first and last piece of equipment    Resistance Training Performed Yes    VAD Patient? No    PAD/SET Patient? No      Pain Assessment   Currently in Pain? No/denies                Social History   Tobacco Use  Smoking Status Never  Smokeless Tobacco Not on file    Goals Met:  Independence with exercise equipment Exercise tolerated well No report of concerns or symptoms today Strength training completed today  Goals Unmet:  Not Applicable  Comments: Pt able to follow exercise prescription today without complaint.  Will continue to monitor for progression.    Dr. Emily Filbert is Medical Director for [place].  Dr. Ottie Glazier is Medical Director for [facility] Pulmonary Rehabilitation.

## 2021-10-28 ENCOUNTER — Encounter: Payer: Medicare Other | Admitting: *Deleted

## 2021-10-28 DIAGNOSIS — I5022 Chronic systolic (congestive) heart failure: Secondary | ICD-10-CM | POA: Diagnosis not present

## 2021-10-28 NOTE — Progress Notes (Signed)
Daily Session Note  Patient Details  Name: Glenda May MRN: [id number] Date of Birth: Jun 04, 1945 Referring Provider:   Flowsheet Row Cardiac Rehab from 10/11/2021 in [facility] Cardiac and Pulmonary Rehab  Referring Provider Emily Filbert       Encounter Date: 10/28/2021  Check In:  Session Check In - 10/28/21 1026       Check-In   Supervising physician immediately available to respond to emergencies See telemetry face sheet for immediately available ER MD    Location [facility]-Cardiac & Pulmonary Rehab    Staff Present Heath Lark, RN, BSN, Jacklynn Bue, MS, ASCM CEP, Exercise Physiologist;Jessica Melrose, MA, RCEP, CCRP, CCET;Joseph Hickman, [place]    Virtual Visit No    Medication changes reported     No    Fall or balance concerns reported    No    Warm-up and Cool-down Performed on first and last piece of equipment    Resistance Training Performed Yes    VAD Patient? No    PAD/SET Patient? No      Pain Assessment   Currently in Pain? No/denies                Social History   Tobacco Use  Smoking Status Never  Smokeless Tobacco Not on file    Goals Met:  Independence with exercise equipment Exercise tolerated well No report of concerns or symptoms today  Goals Unmet:  Not Applicable  Comments: Pt able to follow exercise prescription today without complaint.  Will continue to monitor for progression.  Reviewed home exercise with pt today.  Pt plans to continues walking at home/park for exercise.  We also talked about using our staff videos as well.  Reviewed THR, pulse, RPE, sign and symptoms, pulse oximetery and when to call 911 or MD.  Also discussed weather considerations and indoor options.  Pt voiced understanding.    Dr. Emily Filbert is Medical Director for [place].  Dr. Ottie Glazier is Medical Director for [facility] Pulmonary Rehabilitation.

## 2021-11-02 ENCOUNTER — Encounter: Payer: Medicare Other | Admitting: *Deleted

## 2021-11-02 DIAGNOSIS — I5022 Chronic systolic (congestive) heart failure: Secondary | ICD-10-CM | POA: Diagnosis not present

## 2021-11-02 NOTE — Progress Notes (Signed)
Daily Session Note  Patient Details  Name: Glenda May MRN: [id number] Date of Birth: Aug 14, 1945 Referring Provider:   Flowsheet Row Cardiac Rehab from 10/11/2021 in [facility] Cardiac and Pulmonary Rehab  Referring Provider Emily Filbert       Encounter Date: 11/02/2021  Check In:  Session Check In - 11/02/21 1114       Check-In   Supervising physician immediately available to respond to emergencies See telemetry face sheet for immediately available ER MD    Location [facility]-Cardiac & Pulmonary Rehab    Staff Present Coralie Keens, MS, ASCM CEP, Exercise Physiologist;Jessica Luan Pulling, MA, RCEP, CCRP, Mindi Curling, RN, [place]    Virtual Visit No    Medication changes reported     No    Fall or balance concerns reported    No    Warm-up and Cool-down Performed on first and last piece of equipment    Resistance Training Performed Yes    VAD Patient? No    PAD/SET Patient? No      Pain Assessment   Currently in Pain? No/denies                Social History   Tobacco Use  Smoking Status Never  Smokeless Tobacco Not on file    Goals Met:  Independence with exercise equipment Exercise tolerated well No report of concerns or symptoms today Strength training completed today  Goals Unmet:  Not Applicable  Comments: Pt able to follow exercise prescription today without complaint.  Will continue to monitor for progression.    Dr. Emily Filbert is Medical Director for [place].  Dr. Ottie Glazier is Medical Director for [facility] Pulmonary Rehabilitation.

## 2021-11-04 ENCOUNTER — Encounter: Payer: Medicare Other | Admitting: *Deleted

## 2021-11-04 DIAGNOSIS — I5022 Chronic systolic (congestive) heart failure: Secondary | ICD-10-CM

## 2021-11-04 NOTE — Progress Notes (Signed)
Daily Session Note  Patient Details  Name: Glenda May MRN: [id number] Date of Birth: 08/06/1945 Referring Provider:   Flowsheet Row Cardiac Rehab from 10/11/2021 in [facility] Cardiac and Pulmonary Rehab  Referring Provider Mark Miller       Encounter Date: 11/04/2021  Check In:  Session Check In - 11/04/21 1110       Check-In   Supervising physician immediately available to respond to emergencies See telemetry face sheet for immediately available ER MD    Location [facility]-Cardiac & Pulmonary Rehab    Staff Present Joseph Hood, RCP,RRT,BSRT;Meredith Craven, RN BSN;Megan Smith, RN, ADN    Virtual Visit No    Medication changes reported     No    Fall or balance concerns reported    No    Warm-up and Cool-down Performed on first and last piece of equipment    Resistance Training Performed Yes    VAD Patient? No    PAD/SET Patient? No      Pain Assessment   Currently in Pain? No/denies                Social History   Tobacco Use  Smoking Status Never  Smokeless Tobacco Not on file    Goals Met:  Independence with exercise equipment Exercise tolerated well No report of concerns or symptoms today Strength training completed today  Goals Unmet:  Not Applicable  Comments: Pt able to follow exercise prescription today without complaint.  Will continue to monitor for progression.    Dr. Mark Miller is Medical Director for [facility].  Dr. Fuad Aleskerov is Medical Director for [facility] Pulmonary Rehabilitation. 

## 2021-11-09 ENCOUNTER — Encounter: Payer: Medicare Other | Admitting: *Deleted

## 2021-11-09 DIAGNOSIS — I5022 Chronic systolic (congestive) heart failure: Secondary | ICD-10-CM

## 2021-11-09 NOTE — Progress Notes (Signed)
Daily Session Note  Patient Details  Name: Glenda May MRN: [id number] Date of Birth: [birth year] Referring Provider:   Flowsheet Row Cardiac Rehab from 10/11/2021 in [facility] Cardiac and Pulmonary Rehab  Referring Provider Emily Filbert       Encounter Date: 11/09/2021  Check In:  Session Check In - 11/09/21 1107       Check-In   Supervising physician immediately available to respond to emergencies See telemetry face sheet for immediately available ER MD    Location [facility]-Cardiac & Pulmonary Rehab    Staff Present Alberteen Sam, MA, RCEP, CCRP, Marylynn Pearson, MS, ASCM CEP, Exercise Physiologist;Rosaisela Jamroz Sherryll Burger, RN BSN;Noah Tickle, BS, Exercise Physiologist    Virtual Visit No    Medication changes reported     No    Fall or balance concerns reported    No    Warm-up and Cool-down Performed on first and last piece of equipment    Resistance Training Performed Yes    VAD Patient? No    PAD/SET Patient? No      Pain Assessment   Currently in Pain? No/denies                Social History   Tobacco Use  Smoking Status Never  Smokeless Tobacco Not on file    Goals Met:  Independence with exercise equipment Exercise tolerated well No report of concerns or symptoms today Strength training completed today  Goals Unmet:  Not Applicable  Comments: Pt able to follow exercise prescription today without complaint.  Will continue to monitor for progression.    Dr. Emily Filbert is Medical Director for [place].  Dr. Ottie Glazier is Medical Director for [facility] Pulmonary Rehabilitation.

## 2021-11-10 ENCOUNTER — Encounter: Payer: Self-pay | Admitting: *Deleted

## 2021-11-10 DIAGNOSIS — I5022 Chronic systolic (congestive) heart failure: Secondary | ICD-10-CM

## 2021-11-10 NOTE — Progress Notes (Signed)
Cardiac Individual Treatment Plan  Patient Details  Name: Glenda May MRN: [id number] Date of Birth: 09-Jan-1946 Referring Provider:   Flowsheet Row Cardiac Rehab from 10/11/2021 in [facility] Cardiac and Pulmonary Rehab  Referring Provider Emily Filbert       Initial Encounter Date:  Flowsheet Row Cardiac Rehab from 10/11/2021 in [facility] Cardiac and Pulmonary Rehab  Date 10/11/21       Visit Diagnosis: Heart failure, chronic systolic ([place])  Patient's Home Medications on Admission:  Current Outpatient Medications:    albuterol (VENTOLIN HFA) 108 (90 Base) MCG/ACT inhaler, Inhale 2 puffs into the lungs every 6 (six) hours as needed., Disp: , Rfl:    aspirin EC 81 MG tablet, Take 1 tablet (81 mg total) by mouth daily. Swallow whole., Disp: 30 tablet, Rfl: 12   calcium carbonate (OSCAL) 1500 (600 Ca) MG TABS tablet, Take 600 mg of elemental calcium by mouth 2 (two) times daily with a meal., Disp: , Rfl:    dapagliflozin propanediol (FARXIGA) 10 MG TABS tablet, Take 1 tablet (10 mg total) by mouth daily., Disp: 30 tablet, Rfl: 2   DEXILANT 60 MG capsule, Take 1 capsule by mouth daily., Disp: , Rfl:    furosemide (LASIX) 40 MG tablet, Take 1 tablet (40 mg total) by mouth daily., Disp: 30 tablet, Rfl: 3   loratadine (CLARITIN) 10 MG tablet, Take 10 mg by mouth daily., Disp: , Rfl:    metoprolol succinate (TOPROL-XL) 25 MG 24 hr tablet, Take 1 tablet (25 mg total) by mouth daily., Disp: 90 tablet, Rfl: 3   Multiple Vitamins-Minerals (WOMENS MULTI GUMMIES PO), Take 1 each by mouth daily., Disp: , Rfl:    rosuvastatin (CRESTOR) 20 MG tablet, Take 20 mg by mouth daily., Disp: , Rfl:    spironolactone (ALDACTONE) 25 MG tablet, Take 0.5 tablets (12.5 mg total) by mouth daily., Disp: 30 tablet, Rfl: 3  Past Medical History: Past Medical History:  Diagnosis Date   Allergic rhinitis    CHF (congestive heart failure) (HCC)    High cholesterol    Hypertension     Tobacco Use: Social History    Tobacco Use  Smoking Status Never  Smokeless Tobacco Not on file    Labs: Review Flowsheet       Latest Ref Rng & Units 08/28/2021  Labs for ITP Cardiac and Pulmonary Rehab  Cholestrol 0 - 200 mg/dL 183   LDL (calc) 0 - 99 mg/dL 97   HDL-C >40 mg/dL 56   Trlycerides <150 mg/dL 151   Hemoglobin A1c 4.8 - 5.6 % 5.9      Exercise Target Goals: Exercise Program Goal: Individual exercise prescription set using results from initial 6 min walk test and THRR while considering  patient's activity barriers and safety.   Exercise Prescription Goal: Initial exercise prescription builds to 30-45 minutes a day of aerobic activity, 2-3 days per week.  Home exercise guidelines will be given to patient during program as part of exercise prescription that the participant will acknowledge.   Education: Aerobic Exercise: - Group verbal and visual presentation on the components of exercise prescription. Introduces F.I.T.T principle from ACSM for exercise prescriptions.  Reviews F.I.T.T. principles of aerobic exercise including progression. Written material given at graduation. Flowsheet Row Cardiac Rehab from 11/04/2021 in [facility] Cardiac and Pulmonary Rehab  Education need identified 10/11/21       Education: Resistance Exercise: - Group verbal and visual presentation on the components of exercise prescription. Introduces F.I.T.T principle from ACSM for exercise  prescriptions  Reviews F.I.T.T. principles of resistance exercise including progression. Written material given at graduation.    Education: Exercise & Equipment Safety: - Individual verbal instruction and demonstration of equipment use and safety with use of the equipment. Flowsheet Row Cardiac Rehab from 11/04/2021 in [facility] Cardiac and Pulmonary Rehab  Date 10/11/21  Educator [facility]  Instruction Review Code 1- Verbalizes Understanding       Education: Exercise Physiology & General Exercise Guidelines: - Group verbal and written  instruction with models to review the exercise physiology of the cardiovascular system and associated critical values. Provides general exercise guidelines with specific guidelines to those with heart or lung disease.    Education: Flexibility, Balance, Mind/Body Relaxation: - Group verbal and visual presentation with interactive activity on the components of exercise prescription. Introduces F.I.T.T principle from ACSM for exercise prescriptions. Reviews F.I.T.T. principles of flexibility and balance exercise training including progression. Also discusses the mind body connection.  Reviews various relaxation techniques to help reduce and manage stress (i.e. Deep breathing, progressive muscle relaxation, and visualization). Balance handout provided to take home. Written material given at graduation.   Activity Barriers & Risk Stratification:  Activity Barriers & Cardiac Risk Stratification - 09/29/21 1534       Activity Barriers & Cardiac Risk Stratification   Activity Barriers Back Problems    Cardiac Risk Stratification High             6 Minute Walk:  6 Minute Walk     Row Name 10/11/21 1515         6 Minute Walk   Phase Initial     Distance 1360 feet     Walk Time 6 minutes     # of Rest Breaks 0     MPH 2.58     METS 2.98     RPE 13     Perceived Dyspnea  1     VO2 Peak 10.44     Symptoms No     Resting HR 83 bpm     Resting BP 124/62     Resting Oxygen Saturation  100 %     Exercise Oxygen Saturation  during 6 min walk 100 %     Max Ex. HR 106 bpm     Max Ex. BP 142/78     2 Minute Post BP 114/62              Oxygen Initial Assessment:   Oxygen Re-Evaluation:   Oxygen Discharge (Final Oxygen Re-Evaluation):   Initial Exercise Prescription:  Initial Exercise Prescription - 10/11/21 1500       Date of Initial Exercise RX and Referring Provider   Date 10/11/21    Referring Provider Emily Filbert      Oxygen   Maintain Oxygen Saturation 88% or  higher      Treadmill   MPH 2.3    Grade 0.5    Minutes 15    METs 2.92      Recumbant Bike   Level 2    RPM 50    Minutes 15    METs 2.98      NuStep   Level 2    SPM 80    Minutes 15    METs 2.92      REL-XR   Level 2    Speed 50    Minutes 15    METs 2.92      T5 Nustep   Level 1    SPM 80  Minutes 15    METs 2.92      Biostep-RELP   Level 2    SPM 50    Minutes 15    METs 2.92      Prescription Details   Frequency (times per week) 2    Duration Progress to 30 minutes of continuous aerobic without signs/symptoms of physical distress      Intensity   THRR 40-80% of Max Heartrate 107-130    Ratings of Perceived Exertion 11-13    Perceived Dyspnea 0-4      Progression   Progression Continue to progress workloads to maintain intensity without signs/symptoms of physical distress.      Resistance Training   Training Prescription Yes    Weight 3    Reps 10-15             Perform Capillary Blood Glucose checks as needed.  Exercise Prescription Changes:   Exercise Prescription Changes     Row Name 10/11/21 1500 10/28/21 1100 10/28/21 1600 11/09/21 1500       Response to Exercise   Blood Pressure (Admit) 124/62 -- 112/62 118/62    Blood Pressure (Exercise) 142/78 -- 140/66 132/58    Blood Pressure (Exit) 114/62 -- 104/62 122/62    Heart Rate (Admit) 83 bpm -- 82 bpm 81 bpm    Heart Rate (Exercise) 106 bpm -- 125 bpm 127 bpm    Heart Rate (Exit) 84 bpm -- 90 bpm 97 bpm    Oxygen Saturation (Admit) 100 % -- -- --    Oxygen Saturation (Exercise) 100 % -- -- --    Oxygen Saturation (Exit) 100 % -- -- --    Rating of Perceived Exertion (Exercise) 13 -- 13 13    Perceived Dyspnea (Exercise) 1 -- 0 --    Symptoms none -- none none    Comments 6 MWT results -- First 3 days of rehab --    Duration -- -- Continue with 30 min of aerobic exercise without signs/symptoms of physical distress. Continue with 30 min of aerobic exercise without  signs/symptoms of physical distress.    Intensity -- -- THRR unchanged THRR unchanged      Progression   Progression -- -- Continue to progress workloads to maintain intensity without signs/symptoms of physical distress. Continue to progress workloads to maintain intensity without signs/symptoms of physical distress.    Average METs -- -- 2.74 3.83      Resistance Training   Training Prescription -- -- Yes Yes    Weight -- -- 3 lb 3 lb    Reps -- -- 10-15 10-15      Interval Training   Interval Training -- -- No No      Treadmill   MPH -- -- 2.3 3    Grade -- -- 1 1    Minutes -- -- 15 15    METs -- -- 3.08 3.71      Recumbant Bike   Level -- -- -- 4    Minutes -- -- -- 15    METs -- -- -- 4.03      NuStep   Level -- -- -- 4    Minutes -- -- -- 15    METs -- -- -- 2.7      REL-XR   Level -- -- 2 5    Minutes -- -- 15 15    METs -- -- 2.9 5.7      T5 Nustep   Level -- -- 1 --  Minutes -- -- 15 --    METs -- -- 2.1 --      Biostep-RELP   Level -- -- 3 3    Minutes -- -- 15 15    METs -- -- 3 4      Track   Laps -- -- 32 30    Minutes -- -- 15 15    METs -- -- 2.63 2.63      Home Exercise Plan   Plans to continue exercise at -- Home (comment)  walking, staff videos Home (comment)  walking, staff videos Home (comment)  walking, staff videos    Frequency -- Add 3 additional days to program exercise sessions. Add 3 additional days to program exercise sessions. Add 3 additional days to program exercise sessions.    Initial Home Exercises Provided -- 10/28/21 10/28/21 10/28/21      Oxygen   Maintain Oxygen Saturation -- -- 88% or higher 88% or higher             Exercise Comments:   Exercise Comments     Row Name 10/12/21 1117           Exercise Comments First full day of exercise!  Patient was oriented to gym and equipment including functions, settings, policies, and procedures.  Patient's individual exercise prescription and treatment plan were  reviewed.  All starting workloads were established based on the results of the 6 minute walk test done at initial orientation visit.  The plan for exercise progression was also introduced and progression will be customized based on patient's performance and goals                Exercise Goals and Review:   Exercise Goals     Row Name 10/11/21 1522             Exercise Goals   Increase Physical Activity Yes       Intervention Provide advice, education, support and counseling about physical activity/exercise needs.       Expected Outcomes Short Term: Attend rehab on a regular basis to increase amount of physical activity.;Long Term: Add in home exercise to make exercise part of routine and to increase amount of physical activity.;Long Term: Exercising regularly at least 3-5 days a week.       Increase Strength and Stamina Yes       Intervention Develop an individualized exercise prescription for aerobic and resistive training based on initial evaluation findings, risk stratification, comorbidities and participant's personal goals.;Provide advice, education, support and counseling about physical activity/exercise needs.       Expected Outcomes Short Term: Increase workloads from initial exercise prescription for resistance, speed, and METs.;Short Term: Perform resistance training exercises routinely during rehab and add in resistance training at home;Long Term: Improve cardiorespiratory fitness, muscular endurance and strength as measured by increased METs and functional capacity (6MWT)       Able to understand and use rate of perceived exertion (RPE) scale Yes       Intervention Provide education and explanation on how to use RPE scale       Expected Outcomes Short Term: Able to use RPE daily in rehab to express subjective intensity level;Long Term:  Able to use RPE to guide intensity level when exercising independently       Able to understand and use Dyspnea scale Yes       Intervention  Provide education and explanation on how to use Dyspnea scale  Expected Outcomes Short Term: Able to use Dyspnea scale daily in rehab to express subjective sense of shortness of breath during exertion;Long Term: Able to use Dyspnea scale to guide intensity level when exercising independently       Knowledge and understanding of Target Heart Rate Range (THRR) Yes       Intervention Provide education and explanation of THRR including how the numbers were predicted and where they are located for reference       Expected Outcomes Short Term: Able to state/look up THRR;Short Term: Able to use daily as guideline for intensity in rehab;Long Term: Able to use THRR to govern intensity when exercising independently       Able to check pulse independently Yes       Intervention Provide education and demonstration on how to check pulse in carotid and radial arteries.;Review the importance of being able to check your own pulse for safety during independent exercise       Expected Outcomes Short Term: Able to explain why pulse checking is important during independent exercise;Long Term: Able to check pulse independently and accurately       Understanding of Exercise Prescription Yes       Intervention Provide education, explanation, and written materials on patient's individual exercise prescription       Expected Outcomes Short Term: Able to explain program exercise prescription;Long Term: Able to explain home exercise prescription to exercise independently                Exercise Goals Re-Evaluation :  Exercise Goals Re-Evaluation     Row Name 10/12/21 1117 10/28/21 1108 10/28/21 1607 11/09/21 1601       Exercise Goal Re-Evaluation   Exercise Goals Review Increase Physical Activity;Able to understand and use rate of perceived exertion (RPE) scale;Knowledge and understanding of Target Heart Rate Range (THRR);Understanding of Exercise Prescription;Increase Strength and Stamina;Able to check pulse  independently Increase Physical Activity;Able to understand and use rate of perceived exertion (RPE) scale;Knowledge and understanding of Target Heart Rate Range (THRR);Understanding of Exercise Prescription;Increase Strength and Stamina;Able to check pulse independently;Able to understand and use Dyspnea scale Increase Physical Activity;Understanding of Exercise Prescription;Increase Strength and Stamina Increase Physical Activity;Understanding of Exercise Prescription;Increase Strength and Stamina    Comments Reviewed RPE and dyspnea scales, THR and program prescription with pt today.  Pt voiced understanding and was given a copy of goals to take home. Janiel is doing well in rehab.  She has noted that her stamina is getting better.  She is already walking on her off days.  Reviewed home exercise with pt today.  Pt plans to continues walking at home/park for exercise.  We also talked about using our staff videos as well.  Reviewed THR, pulse, RPE, sign and symptoms, pulse oximetery and when to call 911 or MD.  Also discussed weather considerations and indoor options.  Pt voiced understanding. Tiasha is doing well in rehab. She had an overall average MET level of 2.74 METs. She got up to 32 laps on the track and improved to level 3 on the biostep. We will continue to monitor her progress in the program. Saniah continues to do well in rehab. she has significantly increased most of her workloads; specifically, she went to level 4 on both the T4 Nustep and recumbent bike. She also increased to level on the XR working up to almost 6 METS! She would benefit from increasing her handweights to 4 lbs. Will continue to monitor.    Expected  Outcomes Short: Use RPE daily to regulate intensity. Long: Follow program prescription in THR. Short: Conitnue to walk on off days Long: Continue to improve stamina Short: Continue to attend cardiac rehab and follow current exercise prescription. Long: Continue to increase  strength and stamina. Short: Increase resistance training to 4 lbs Long: Continue to increase overall MET level             Discharge Exercise Prescription (Final Exercise Prescription Changes):  Exercise Prescription Changes - 11/09/21 1500       Response to Exercise   Blood Pressure (Admit) 118/62    Blood Pressure (Exercise) 132/58    Blood Pressure (Exit) 122/62    Heart Rate (Admit) 81 bpm    Heart Rate (Exercise) 127 bpm    Heart Rate (Exit) 97 bpm    Rating of Perceived Exertion (Exercise) 13    Symptoms none    Duration Continue with 30 min of aerobic exercise without signs/symptoms of physical distress.    Intensity THRR unchanged      Progression   Progression Continue to progress workloads to maintain intensity without signs/symptoms of physical distress.    Average METs 3.83      Resistance Training   Training Prescription Yes    Weight 3 lb    Reps 10-15      Interval Training   Interval Training No      Treadmill   MPH 3    Grade 1    Minutes 15    METs 3.71      Recumbant Bike   Level 4    Minutes 15    METs 4.03      NuStep   Level 4    Minutes 15    METs 2.7      REL-XR   Level 5    Minutes 15    METs 5.7      Biostep-RELP   Level 3    Minutes 15    METs 4      Track   Laps 30    Minutes 15    METs 2.63      Home Exercise Plan   Plans to continue exercise at Home (comment)   walking, staff videos   Frequency Add 3 additional days to program exercise sessions.    Initial Home Exercises Provided 10/28/21      Oxygen   Maintain Oxygen Saturation 88% or higher             Nutrition:  Target Goals: Understanding of nutrition guidelines, daily intake of sodium <1520m, cholesterol <2011m calories 30% from fat and 7% or less from saturated fats, daily to have 5 or more servings of fruits and vegetables.  Education: All About Nutrition: -Group instruction provided by verbal, written material, interactive activities,  discussions, models, and posters to present general guidelines for heart healthy nutrition including fat, fiber, MyPlate, the role of sodium in heart healthy nutrition, utilization of the nutrition label, and utilization of this knowledge for meal planning. Follow up email sent as well. Written material given at graduation. [facility]  Education need identified 10/11/21       Biometrics:  Pre Biometrics - 10/11/21 1522       Pre Biometrics   Height 5' 2.75" (1.594 m)    Weight 121 lb 1.6 oz (54.9 kg)    Waist Circumference 30 inches    Hip Circumference 36.5 inches  Waist to Hip Ratio 0.82 %    BMI (Calculated) 21.62    Single Leg Stand 6.05 seconds              Nutrition Therapy Plan and Nutrition Goals:  Nutrition Therapy & Goals - 10/11/21 1525       Intervention Plan   Intervention Prescribe, educate and counsel regarding individualized specific dietary modifications aiming towards targeted core components such as weight, hypertension, lipid management, diabetes, heart failure and other comorbidities.    Expected Outcomes Short Term Goal: Understand basic principles of dietary content, such as calories, fat, sodium, cholesterol and nutrients.;Short Term Goal: A plan has been developed with personal nutrition goals set during dietitian appointment.;Long Term Goal: Adherence to prescribed nutrition plan.             Nutrition Assessments:  MEDIFICTS Score Key: ?70 Need to make dietary changes  40-70 Heart Healthy Diet ? 40 Therapeutic Level Cholesterol Diet  Flowsheet Row Cardiac Rehab from 10/11/2021 in [facility] Cardiac and Pulmonary Rehab  Picture Your Plate Total Score on Admission 85      Picture Your Plate Scores: <93 Unhealthy dietary pattern with much room for improvement. 41-50 Dietary pattern unlikely to meet recommendations for good health and room for improvement. 51-60 More  healthful dietary pattern, with some room for improvement.  >60 Healthy dietary pattern, although there may be some specific behaviors that could be improved.    Nutrition Goals Re-Evaluation:  Nutrition Goals Re-Evaluation     [place] Name 10/28/21 1124             Goals   Nutrition Goal ST: add protein and fat to snack - almonds with fruit, add additional snack mid-afternoon - greek yogurt with fruit. LT: Gain 10 pounds, continue with heart healthy changes, meet energy and protein needs       Comment Parnika is doing well with her diet.  She is using her air fryer, grill, and baking.  She is eating chicken and fish.  She is doiing better with staying away from fried foods.  She has been watching her salt.  She is working to increase her protein intake.  She is still trying to gain weight.       Expected Outcome Short: Continue to add in more protein Long: Continue to focus on heart healthy diet                Nutrition Goals Discharge (Final Nutrition Goals Re-Evaluation):  Nutrition Goals Re-Evaluation - 10/28/21 1124       Goals   Nutrition Goal ST: add protein and fat to snack - almonds with fruit, add additional snack mid-afternoon - greek yogurt with fruit. LT: Gain 10 pounds, continue with heart healthy changes, meet energy and protein needs    Comment Katheren is doing well with her diet.  She is using her air fryer, grill, and baking.  She is eating chicken and fish.  She is doiing better with staying away from fried foods.  She has been watching her salt.  She is working to increase her protein intake.  She is still trying to gain weight.    Expected Outcome Short: Continue to add in more protein Long: Continue to focus on heart healthy diet             Psychosocial: Target Goals: Acknowledge presence or absence of significant depression and/or stress, maximize coping skills, provide positive support system. Participant is able to verbalize types and ability to use  techniques and skills needed for reducing stress and depression.   Education: Stress, Anxiety, and Depression - Group verbal and visual presentation to define topics covered.  Reviews how body is impacted by stress, anxiety, and depression.  Also discusses healthy ways to reduce stress and to treat/manage anxiety and depression.  Written material given at graduation. Flowsheet Row Cardiac Rehab from 11/04/2021 in [facility] Cardiac and Pulmonary Rehab  Date 11/04/21  Educator [facility]  Instruction Review Code 1- [employer] Understanding       Education: Sleep Hygiene -Provides group verbal and written instruction about how sleep can affect your health.  Define sleep hygiene, discuss sleep cycles and impact of sleep habits. Review good sleep hygiene tips.    Initial Review & Psychosocial Screening:  Initial Psych Review & Screening - 09/29/21 1540       Initial Review   Current issues with Current Stress Concerns;Current Sleep Concerns    Source of Stress Concerns Chronic Illness      Family Dynamics   Good Support System? Yes   family     Barriers   Psychosocial barriers to participate in program There are no identifiable barriers or psychosocial needs.;The patient should benefit from training in stress management and relaxation.      Screening Interventions   Interventions Encouraged to exercise;Provide feedback about the scores to participant;To provide support and resources with identified psychosocial needs    Expected Outcomes Short Term goal: Utilizing psychosocial counselor, staff and physician to assist with identification of specific Stressors or current issues interfering with healing process. Setting desired goal for each stressor or current issue identified.;Long Term Goal: Stressors or current issues are controlled or eliminated.;Short Term goal: Identification and review with participant of any Quality of Life or Depression concerns found by scoring the questionnaire.;Long Term  goal: The participant improves quality of Life and PHQ9 Scores as seen by post scores and/or verbalization of changes             Quality of Life Scores:   Quality of Life - 10/11/21 1527       Quality of Life   Select Quality of Life      Quality of Life Scores   Health/Function Pre 22.7 %    Socioeconomic Pre 26.14 %    Psych/Spiritual Pre 24.86 %    Family Pre 25.2 %    GLOBAL Pre 24.22 %            Scores of 19 and below usually indicate a poorer quality of life in these areas.  A difference of  2-3 points is a clinically meaningful difference.  A difference of 2-3 points in the total score of the Quality of Life Index has been associated with significant improvement in overall quality of life, self-image, physical symptoms, and general health in studies assessing change in quality of life.  PHQ-9: Review Flowsheet       11/09/2021 10/14/2021 10/11/2021  Depression screen PHQ 2/9  Decreased Interest 0 0 3  Down, Depressed, Hopeless 0 1 1  PHQ - 2 Score 0 1 4  Altered sleeping 1 - 1  Tired, decreased energy 1 - 1  Change in appetite 0 - 1  Feeling bad or failure about yourself  0 - 0  Trouble concentrating 1 - 1  Moving slowly or fidgety/restless 0 - 1  Suicidal thoughts 0 - 0  PHQ-9 Score 3 - 9  Difficult doing work/chores Somewhat difficult - Somewhat difficult   Interpretation of Total Score  Total Score Depression Severity:  1-4 = Minimal depression, 5-9 = Mild depression, 10-14 = Moderate depression, 15-19 = Moderately severe depression, 20-27 = Severe depression   Psychosocial Evaluation and Intervention:  Psychosocial Evaluation - 09/29/21 1549       Psychosocial Evaluation & Interventions   Interventions Encouraged to exercise with the program and follow exercise prescription;Stress management education;Relaxation education    Comments Ms. Giannotti was surprised her heart was having issues and is really wanting to gain more knowledge about her heart  failure diagnosis. She states she has a history of bronchitis and asthma, but never thought her heart would be giving her issues. She has changed her diet and is starting to exercise more. Her sleep has been a struggle for a while, in that she wakes up early morning and has a hard time falling back asleep. She has talked with her doctor about this and is trying to still lay there and rest to help her heart. Her family is involved in her care.    Expected Outcomes Short: attend cardiac rehab for education. Long: develop and maintain positive self care habits.    Continue Psychosocial Services  Follow up required by staff             Psychosocial Re-Evaluation:  Psychosocial Re-Evaluation     [place] Name 10/28/21 1115             Psychosocial Re-Evaluation   Current issues with Current Stress Concerns;Current Sleep Concerns       Comments Amara  is doing well in rehab.  Her biggest stressor is her health. She does wake up around 3-4am for an hour and then finally back to sleep.  She has talked to her doctor about it and they want to do a sleep study with her.  Her sister  that she was helping to feed has now reached the point for Hospice.  She is 48 and not a candidate for dialysis.  She is unable to care for herself.  They all take turns watching her and helping.  Cailynn is adjusting to knowing about her heart failure.  She wants to work to get her EF back up and get better to keep going.  She is scheduled for an MRI in January and considering an ICD.       Expected Outcomes Short: Get sleep study Long: Continue to exericse to for mental boost       Interventions Encouraged to attend Cardiac Rehabilitation for the exercise       Continue Psychosocial Services  Follow up required by staff                Psychosocial Discharge (Final Psychosocial Re-Evaluation):  Psychosocial Re-Evaluation - 10/28/21 1115       Psychosocial Re-Evaluation   Current issues with Current Stress  Concerns;Current Sleep Concerns    Comments Yuridia  is doing well in rehab.  Her biggest stressor is her health. She does wake up around 3-4am for an hour and then finally back to sleep.  She has talked to her doctor about it and they want to do a sleep study with her.  Her sister  that she was helping to feed has now reached the point for Hospice.  She is 18 and not a candidate for dialysis.  She is unable to care for herself.  They all take turns watching her and helping.  Idaliz is adjusting to knowing about her heart failure.  She wants to work to  get her EF back up and get better to keep going.  She is scheduled for an MRI in January and considering an ICD.    Expected Outcomes Short: Get sleep study Long: Continue to exericse to for mental boost    Interventions Encouraged to attend Cardiac Rehabilitation for the exercise    Continue Psychosocial Services  Follow up required by staff             Vocational Rehabilitation: Provide vocational rehab assistance to qualifying candidates.   Vocational Rehab Evaluation & Intervention:  Vocational Rehab - 09/29/21 1540       Initial Vocational Rehab Evaluation & Intervention   Assessment shows need for Vocational Rehabilitation No             Education: Education Goals: Education classes will be provided on a variety of topics geared toward better understanding of heart health and risk factor modification. Participant will state understanding/return demonstration of topics presented as noted by education test scores.  Learning Barriers/Preferences:  Learning Barriers/Preferences - 09/29/21 1538       Learning Barriers/Preferences   Learning Barriers None    Learning Preferences Individual Instruction             General Cardiac Education Topics:  AED/CPR: - Group verbal and written instruction with the use of models to demonstrate the basic use of the AED with the basic ABC's of resuscitation.   Anatomy and  Cardiac Procedures: - Group verbal and visual presentation and models provide information about basic cardiac anatomy and function. Reviews the testing methods done to diagnose heart disease and the outcomes of the test results. Describes the treatment choices: Medical Management, Angioplasty, or Coronary Bypass Surgery for treating various heart conditions including Myocardial Infarction, Angina, Valve Disease, and Cardiac Arrhythmias.  Written material given at graduation.   Medication Safety: - Group verbal and visual instruction to review commonly prescribed medications for heart and lung disease. Reviews the medication, class of the drug, and side effects. Includes the steps to properly store meds and maintain the prescription regimen.  Written material given at graduation.   Intimacy: - Group verbal instruction through game format to discuss how heart and lung disease can affect sexual intimacy. Written material given at graduation..   Know Your Numbers and Heart Failure: - Group verbal and visual instruction to discuss disease risk factors for cardiac and pulmonary disease and treatment options.  Reviews associated critical values for Overweight/Obesity, Hypertension, Cholesterol, and Diabetes.  Discusses basics of heart failure: signs/symptoms and treatments.  Introduces Heart Failure Zone chart for action plan for heart failure.  Written material given at graduation. Flowsheet Row Cardiac Rehab from 11/04/2021 in [facility] Cardiac and Pulmonary Rehab  Date 10/21/21  Educator SB  Instruction Review Code 1- Verbalizes Understanding       Infection Prevention: - Provides verbal and written material to individual with discussion of infection control including proper hand washing and proper equipment cleaning during exercise session. Flowsheet Row Cardiac Rehab from 11/04/2021 in [facility] Cardiac and Pulmonary Rehab  Date 10/11/21  Educator [facility]  Instruction Review Code 1- Verbalizes  Understanding       Falls Prevention: - Provides verbal and written material to individual with discussion of falls prevention and safety. Flowsheet Row Cardiac Rehab from 11/04/2021 in [facility] Cardiac and Pulmonary Rehab  Date 10/11/21  Educator [facility]  Instruction Review Code 1- Verbalizes Understanding       Other: -Provides group and verbal instruction on various topics (see comments)  Knowledge Questionnaire Score:  Knowledge Questionnaire Score - 10/11/21 1527       Knowledge Questionnaire Score   Pre Score 24/26             Core Components/Risk Factors/Patient Goals at Admission:  Personal Goals and Risk Factors at Admission - 10/11/21 1525       Core Components/Risk Factors/Patient Goals on Admission    Weight Management Yes    Intervention Weight Management: Develop a combined nutrition and exercise program designed to reach desired caloric intake, while maintaining appropriate intake of nutrient and fiber, sodium and fats, and appropriate energy expenditure required for the weight goal.;Weight Management: Provide education and appropriate resources to help participant work on and attain dietary goals.    Admit Weight 121 lb 1.6 oz (54.9 kg)    Goal Weight: Short Term 125 lb (56.7 kg)    Goal Weight: Long Term 130 lb (59 kg)    Expected Outcomes Short Term: Continue to assess and modify interventions until short term weight is achieved;Long Term: Adherence to nutrition and physical activity/exercise program aimed toward attainment of established weight goal;Understanding recommendations for meals to include 15-35% energy as protein, 25-35% energy from fat, 35-60% energy from carbohydrates, less than 280m of dietary cholesterol, 20-35 gm of total fiber daily;Understanding of distribution of calorie intake throughout the day with the consumption of 4-5 meals/snacks;Weight Gain: Understanding of general recommendations for a high calorie, high protein meal plan that  promotes weight gain by distributing calorie intake throughout the day with the consumption for 4-5 meals, snacks, and/or supplements    Heart Failure Yes    Intervention Provide a combined exercise and nutrition program that is supplemented with education, support and counseling about heart failure. Directed toward relieving symptoms such as shortness of breath, decreased exercise tolerance, and extremity edema.    Expected Outcomes Improve functional capacity of life;Short term: Attendance in program 2-3 days a week with increased exercise capacity. Reported lower sodium intake. Reported increased fruit and vegetable intake. Reports medication compliance.;Short term: Daily weights obtained and reported for increase. Utilizing diuretic protocols set by physician.;Long term: Adoption of self-care skills and reduction of barriers for early signs and symptoms recognition and intervention leading to self-care maintenance.    Hypertension Yes    Intervention Provide education on lifestyle modifcations including regular physical activity/exercise, weight management, moderate sodium restriction and increased consumption of fresh fruit, vegetables, and low fat dairy, alcohol moderation, and smoking cessation.;Monitor prescription use compliance.    Expected Outcomes Short Term: Continued assessment and intervention until BP is < 140/939mHG in hypertensive participants. < 130/8015mG in hypertensive participants with diabetes, heart failure or chronic kidney disease.;Long Term: Maintenance of blood pressure at goal levels.    Lipids Yes    Intervention Provide education and support for participant on nutrition & aerobic/resistive exercise along with prescribed medications to achieve LDL <47m2mDL >40mg66m Expected Outcomes Short Term: Participant states understanding of desired cholesterol values and is compliant with medications prescribed. Participant is following exercise prescription and nutrition  guidelines.;Long Term: Cholesterol controlled with medications as prescribed, with individualized exercise RX and with personalized nutrition plan. Value goals: LDL < 47mg,59m > 40 mg.             Education:Diabetes - Individual verbal and written instruction to review signs/symptoms of diabetes, desired ranges of glucose level fasting, after meals and with exercise. Acknowledge that pre and post exercise glucose checks will be done for 3 sessions at  entry of program.   Core Components/Risk Factors/Patient Goals Review:   Goals and Risk Factor Review     Row Name 10/28/21 1126             Core Components/Risk Factors/Patient Goals Review   Personal Goals Review Weight Management/Obesity;Heart Failure;Hypertension;Lipids       Review Reem is doing well in rehab.  She has started to gain again and up 2 lb.  Her blood pressures and blood sugars are doing well and she checks them at home routinely.  She denies any heart failure symptoms.  She will have her echo in January for ICD consideration.  We talked about what the ICD looks like and what it does for her.  She is doing well on her medications.       Expected Outcomes Short: Continue to work on weight gain Long: Continue to monitor risk factors.                Core Components/Risk Factors/Patient Goals at Discharge (Final Review):   Goals and Risk Factor Review - 10/28/21 1126       Core Components/Risk Factors/Patient Goals Review   Personal Goals Review Weight Management/Obesity;Heart Failure;Hypertension;Lipids    Review Jisela is doing well in rehab.  She has started to gain again and up 2 lb.  Her blood pressures and blood sugars are doing well and she checks them at home routinely.  She denies any heart failure symptoms.  She will have her echo in January for ICD consideration.  We talked about what the ICD looks like and what it does for her.  She is doing well on her medications.    Expected Outcomes Short:  Continue to work on weight gain Long: Continue to monitor risk factors.             ITP Comments:  ITP Comments     Row Name 09/29/21 1539 10/11/21 1514 10/12/21 1116 10/13/21 0704 11/10/21 0920   ITP Comments Initial telephone orientation completed. Diagnosis can be found in [facility] 8/16. EP orientation scheduled for Monday 10/2 at 1:30pm. Completed 6MWT and gym orientation. Initial ITP created and sent for review to Dr. Emily Filbert, Medical Director. First full day of exercise!  Patient was oriented to gym and equipment including functions, settings, policies, and procedures.  Patient's individual exercise prescription and treatment plan were reviewed.  All starting workloads were established based on the results of the 6 minute walk test done at initial orientation visit.  The plan for exercise progression was also introduced and progression will be customized based on patient's performance and goals 30 Day review completed. Medical Director ITP review done, changes made as directed, and signed approval by Medical Director.   New to program 30 Day review completed. Medical Director ITP review done, changes made as directed, and signed approval by Medical Director.            Comments:

## 2021-11-11 ENCOUNTER — Encounter: Payer: Medicare Other | Attending: Internal Medicine | Admitting: *Deleted

## 2021-11-11 DIAGNOSIS — I5022 Chronic systolic (congestive) heart failure: Secondary | ICD-10-CM | POA: Diagnosis present

## 2021-11-11 NOTE — Progress Notes (Signed)
Daily Session Note  Patient Details  Name: Glenda May MRN: [id number] Date of Birth: 04-Jan-1946 Referring Provider:   Flowsheet Row Cardiac Rehab from 10/11/2021 in [facility] Cardiac and Pulmonary Rehab  Referring Provider Emily Filbert       Encounter Date: 11/11/2021  Check In:  Session Check In - 11/11/21 1111       Check-In   Supervising physician immediately available to respond to emergencies See telemetry face sheet for immediately available ER MD    Location [facility]-Cardiac & Pulmonary Rehab    Staff Present Renita Papa, RN BSN;Jessica Goodrich, MA, RCEP, CCRP, CCET;Joseph Dresbach, [place], RN, [place]    Virtual Visit No    Medication changes reported     No    Fall or balance concerns reported    No    Warm-up and Cool-down Performed on first and last piece of equipment    Resistance Training Performed Yes    VAD Patient? No    PAD/SET Patient? No      Pain Assessment   Currently in Pain? No/denies                Social History   Tobacco Use  Smoking Status Never  Smokeless Tobacco Not on file    Goals Met:  Independence with exercise equipment Exercise tolerated well No report of concerns or symptoms today Strength training completed today  Goals Unmet:  Not Applicable  Comments: Pt able to follow exercise prescription today without complaint.  Will continue to monitor for progression.    Dr. Emily Filbert is Medical Director for [place].  Dr. Ottie Glazier is Medical Director for [facility] Pulmonary Rehabilitation.

## 2021-11-16 ENCOUNTER — Encounter: Payer: Medicare Other | Admitting: *Deleted

## 2021-11-16 DIAGNOSIS — I5022 Chronic systolic (congestive) heart failure: Secondary | ICD-10-CM

## 2021-11-16 NOTE — Progress Notes (Signed)
Daily Session Note  Patient Details  Name: Glenda May MRN: [id number] Date of Birth: February 27, 1945 Referring Provider:   Flowsheet Row Cardiac Rehab from 10/11/2021 in [facility] Cardiac and Pulmonary Rehab  Referring Provider Emily Filbert       Encounter Date: 11/16/2021  Check In:  Session Check In - 11/16/21 1151       Check-In   Supervising physician immediately available to respond to emergencies See telemetry face sheet for immediately available ER MD    Location [facility]-Cardiac & Pulmonary Rehab    Staff Present Heath Lark, RN, BSN, CCRP;Jessica Melrose, MA, RCEP, CCRP, Marylynn Pearson, MS, ASCM CEP, Exercise Physiologist;Noah Tickle, BS, Exercise Physiologist    Virtual Visit No    Medication changes reported     No    Fall or balance concerns reported    No    Warm-up and Cool-down Performed on first and last piece of equipment    Resistance Training Performed Yes    VAD Patient? No    PAD/SET Patient? No      Pain Assessment   Currently in Pain? No/denies                Social History   Tobacco Use  Smoking Status Never  Smokeless Tobacco Not on file    Goals Met:  Independence with exercise equipment Exercise tolerated well No report of concerns or symptoms today  Goals Unmet:  Not Applicable  Comments: Pt able to follow exercise prescription today without complaint.  Will continue to monitor for progression.    Dr. Emily Filbert is Medical Director for [place].  Dr. Ottie Glazier is Medical Director for [facility] Pulmonary Rehabilitation.

## 2021-11-18 ENCOUNTER — Encounter: Payer: Medicare Other | Admitting: *Deleted

## 2021-11-18 DIAGNOSIS — I5022 Chronic systolic (congestive) heart failure: Secondary | ICD-10-CM | POA: Diagnosis not present

## 2021-11-18 NOTE — Progress Notes (Signed)
Daily Session Note  Patient Details  Name: Glenda May MRN: [id number] Date of Birth: 28-Oct-1945 Referring Provider:   Flowsheet Row Cardiac Rehab from 10/11/2021 in [facility] Cardiac and Pulmonary Rehab  Referring Provider Emily Filbert       Encounter Date: 11/18/2021  Check In:  Session Check In - 11/18/21 1323       Check-In   Supervising physician immediately available to respond to emergencies See telemetry face sheet for immediately available ER MD    Location [facility]-Cardiac & Pulmonary Rehab    Staff Present Antionette Fairy, BS, Exercise Physiologist;Meredith Sherryll Burger, RN BSN;Joseph Rosebud Poles, RN, [place]    Virtual Visit No    Medication changes reported     No    Fall or balance concerns reported    No    Warm-up and Cool-down Performed on first and last piece of equipment    Resistance Training Performed Yes    VAD Patient? No    PAD/SET Patient? No      Pain Assessment   Currently in Pain? No/denies                Social History   Tobacco Use  Smoking Status Never  Smokeless Tobacco Not on file    Goals Met:  Independence with exercise equipment Exercise tolerated well No report of concerns or symptoms today Strength training completed today  Goals Unmet:  Not Applicable  Comments: Pt able to follow exercise prescription today without complaint.  Will continue to monitor for progression.    Dr. Emily Filbert is Medical Director for [place].  Dr. Ottie Glazier is Medical Director for [facility] Pulmonary Rehabilitation.

## 2021-11-23 ENCOUNTER — Encounter: Payer: Medicare Other | Admitting: *Deleted

## 2021-11-23 DIAGNOSIS — I5022 Chronic systolic (congestive) heart failure: Secondary | ICD-10-CM

## 2021-11-23 NOTE — Progress Notes (Signed)
Daily Session Note  Patient Details  Name: Glenda May MRN: [id number] Date of Birth: 08/14/1945 Referring Provider:   Flowsheet Row Cardiac Rehab from 10/11/2021 in [facility] Cardiac and Pulmonary Rehab  Referring Provider Emily Filbert       Encounter Date: 11/23/2021  Check In:  Session Check In - 11/23/21 1110       Check-In   Supervising physician immediately available to respond to emergencies See telemetry face sheet for immediately available ER MD    Location [facility]-Cardiac & Pulmonary Rehab    Staff Present Alberteen Sam, MA, RCEP, CCRP, Marylynn Pearson, MS, ASCM CEP, Exercise Physiologist;Cayson Kalb Sherryll Burger, RN BSN;Noah Tickle, BS, Exercise Physiologist    Virtual Visit No    Medication changes reported     No    Fall or balance concerns reported    No    Warm-up and Cool-down Performed on first and last piece of equipment    Resistance Training Performed Yes    VAD Patient? No    PAD/SET Patient? No      Pain Assessment   Currently in Pain? No/denies                Social History   Tobacco Use  Smoking Status Never  Smokeless Tobacco Not on file    Goals Met:  Independence with exercise equipment Exercise tolerated well No report of concerns or symptoms today Strength training completed today  Goals Unmet:  Not Applicable  Comments: Pt able to follow exercise prescription today without complaint.  Will continue to monitor for progression.    Dr. Emily Filbert is Medical Director for [place].  Dr. Ottie Glazier is Medical Director for [facility] Pulmonary Rehabilitation.

## 2021-11-23 NOTE — Progress Notes (Unsigned)
Patient ID: Glenda May, female    DOB: May 08, 1945, 76 y.o.   MRN: [id number]  HPI  Glenda May is a 75 y/o female with a history of hyperlipidemia, HTN, pulmonary nodules and chronic heart failure.    Echo report from 08/26/21 reviewed and showed an EF of 20-25% along with mildly elevated PA pressure and moderate MR.   RHC/LHC done 08/30/21 and showed: Mild, nonobstructive coronary artery disease with 10-20% proximal LAD and 20% ostial RCA stenoses.  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 Normal left heart, right heart, and pulmonary artery pressures. Normal Fick cardiac output/index.  Admitted 08/25/21 due to chest pain/tightness with worsening SOB. Initially given IV lasix with transition to oral diuretics. Cardiology consult obtained. Echo and cath done. Right-sided thoracentesis done with removal of 32m. Multiple pulmonary nodules as noted on CT scan. PT/OT evaluations done. Discharged after 6 days.   Glenda May presents today for an acute visit with a chief complaint of minimal fatigue with moderate exertion. Describes this as chronic in nature. Glenda May has associated shortness of breath and light-headedness along with this. Glenda May denies any difficulty sleeping, abdominal distention, palpitations, pedal edema, chest pain, cough or weight gain.   Glenda May says that 5 days ago, Glenda May fixed a ham after soaking it in water. The next day, Glenda May developed n/v and heartburn. Once Glenda May vomited, Glenda May had heartburn most of the day and Glenda May took mylanta. 3 days ago, symptoms completely resolved.   Glenda May does mention that Glenda May inadvertently forgot to take her diuretic 2 days ago but normally Glenda May has not problem remembering to take it.   Glenda May is participating in cardiac rehab. Scheduled for cardiac MRI January 2024.   Past Medical History:  Diagnosis Date   Allergic rhinitis    CHF (congestive heart failure) (HCC)    High cholesterol    Hypertension    Past Surgical History:  Procedure Laterality  Date   APPENDECTOMY     CHOLECYSTECTOMY     LUMBAR LAMINECTOMY     L4-5   RIGHT/LEFT HEART CATH AND CORONARY ANGIOGRAPHY N/A 08/30/2021   Procedure: RIGHT/LEFT HEART CATH AND CORONARY ANGIOGRAPHY;  Surgeon: ENelva Bush MD;  [facility];  Service: Cardiovascular;  Laterality: N/A;   TONSILLECTOMY     Family History  Problem Relation Age of Onset   Stroke Mother        died @ 925  Dementia Mother    Stroke Father        died @ 615  Stroke Sister    Heart attack Brother        died @ 854  Breast cancer Neg Hx    Social History   Tobacco Use   Smoking status: Never   Smokeless tobacco: Not on file  Substance Use Topics   Alcohol use: Not Currently   Allergies  Allergen Reactions   Ace Inhibitors Other (See Comments)    Angioedema/ per patient.    Prior to Admission medications   Medication Sig Start Date End Date Taking? Authorizing Provider  albuterol (VENTOLIN HFA) 108 (90 Base) MCG/ACT inhaler Inhale 2 puffs into the lungs every 6 (six) hours as needed. 09/16/21 09/16/22 Yes [provider]  aspirin EC 81 MG tablet Take 1 tablet (81 mg total) by mouth daily. Swallow whole. 09/01/21  Yes PFritzi Mandes MD  calcium carbonate (OSCAL) 1500 (600 Ca) MG TABS tablet Take 600 mg of elemental calcium by mouth 2 (two) times daily with a  meal.   Yes [provider]  dapagliflozin propanediol (FARXIGA) 10 MG TABS tablet Take 1 tablet (10 mg total) by mouth daily. 09/01/21  Yes Fritzi Mandes, MD  DEXILANT 60 MG capsule Take 1 capsule by mouth daily. 08/17/21  Yes [provider]  furosemide (LASIX) 40 MG tablet Take 1 tablet (40 mg total) by mouth daily. 09/01/21  Yes Fritzi Mandes, MD  loratadine (CLARITIN) 10 MG tablet Take 10 mg by mouth daily.   Yes [provider]  Melatonin 10 MG TBDP Take 10 mg by mouth at bedtime.   Yes [provider]  metoprolol succinate (TOPROL-XL) 25 MG 24 hr tablet Take 1 tablet (25 mg total) by mouth  daily. 10/18/21  Yes Analiese Krupka, Aura Fey, FNP  Multiple Vitamins-Minerals (WOMENS MULTI GUMMIES PO) Take 1 each by mouth daily.   Yes [provider]  nitroGLYCERIN (NITROSTAT) 0.4 MG SL tablet Place 0.4 mg under the tongue every 5 (five) minutes as needed for chest pain.   Yes [provider]  rosuvastatin (CRESTOR) 20 MG tablet Take 20 mg by mouth daily. 09/30/21  Yes [provider]  spironolactone (ALDACTONE) 25 MG tablet Take 0.5 tablets (12.5 mg total) by mouth daily. 09/01/21  Yes Fritzi Mandes, MD    Review of Systems  Constitutional:  Positive for fatigue. Negative for appetite change.  HENT:  Negative for congestion, postnasal drip and sore throat.   Eyes: Negative.   Respiratory:  Positive for shortness of breath. Negative for cough and chest tightness.   Cardiovascular:  Negative for chest pain, palpitations and leg swelling.  Gastrointestinal:  Negative for abdominal distention, abdominal pain, nausea and vomiting.  Endocrine: Negative.   Genitourinary: Negative.   Musculoskeletal:  Negative for back pain and neck pain.  Skin: Negative.   Allergic/Immunologic: Negative.   Neurological:  Positive for light-headedness (with sudden position changes). Negative for dizziness.  Hematological:  Negative for adenopathy. Does not bruise/bleed easily.  Psychiatric/Behavioral:  Positive for sleep disturbance (chronic; sleeping on 2 pillows). Negative for dysphoric mood. The patient is not nervous/anxious.    Vitals:   11/24/21 0829  BP: 138/63  Pulse: 88  Resp: 20  SpO2: 100%  Weight: 121 lb 8 oz (55.1 kg)   Wt Readings from Last 3 Encounters:  11/24/21 121 lb 8 oz (55.1 kg)  10/14/21 122 lb 8 oz (55.6 kg)  10/11/21 121 lb 1.6 oz (54.9 kg)   Lab Results  Component Value Date   CREATININE 0.69 08/31/2021   CREATININE 1.06 (H) 08/30/2021   CREATININE 0.83 08/29/2021   Physical Exam Vitals and nursing note reviewed.  Constitutional:      Appearance:  Normal appearance.  HENT:     Head: Normocephalic and atraumatic.  Cardiovascular:     Rate and Rhythm: Normal rate and regular rhythm.  Pulmonary:     Effort: Pulmonary effort is normal. No respiratory distress.     Breath sounds: No wheezing or rales.  Abdominal:     General: There is no distension.     Palpations: Abdomen is soft.     Tenderness: There is no abdominal tenderness.  Musculoskeletal:        General: No tenderness.     Cervical back: Normal range of motion and neck supple.     Right lower leg: No edema.     Left lower leg: No edema.  Skin:    General: Skin is warm and dry.  Neurological:     General: No focal  deficit present.     Mental Status: Glenda May is alert and oriented to person, place, and time.  Psychiatric:        Mood and Affect: Mood normal.        Behavior: Behavior normal.        Thought Content: Thought content normal.   Assessment & Plan:  1: Chronic heart failure with reduced ejection fraction- - NYHA class II - euvolemic today - weighing daily; reminded to call for an overnight weight gain of > 2 pounds or a weekly weight gain of > 5 pounds - weight down 1 pound from last visit here 6 weeks ago - saw cardiology Sharlett Iles) 09/30/21 - has cardiac MRI scheduled for 01/11/22 - not adding any salt to her food and has been reading food labels for sodium content - on GDMT of farxiga, metoprolol and spironolactone - had angioedema with ACE-i - participating in cardiac rehab - BNP 08/26/21 was 2082.8 - PharmD reconciled medications with the patient  2: HTN- - BP looks good (138/63) - saw PCP Sabra Heck) 09/16/21 - BMP 09/07/21 reviewed and showed sodium 135, potassium 4.6, creatinine 1.0 and GFR 65 - will check BMP today and possibly decrease her diuretic from '40mg'$  daily to '20mg'$  daily with '20mg'$  PRN  3: Pulmonary nodules- - noted on CT - repeat CT in 6 months - saw pulmonology Lanney Gins) 09/16/21   Medication bottles reviewed.   Return in 1 month,  sooner if needed. Should GI symptoms return, follow-up with PCP

## 2021-11-24 ENCOUNTER — Encounter: Payer: Self-pay | Admitting: Family

## 2021-11-24 ENCOUNTER — Encounter: Payer: Self-pay | Admitting: Pharmacist

## 2021-11-24 ENCOUNTER — Other Ambulatory Visit
Admission: RE | Admit: 2021-11-24 | Discharge: 2021-11-24 | Disposition: A | Discharge status: Home / Self Care | Payer: Medicare Other | Source: Ambulatory Visit | Attending: Family | Admitting: Family

## 2021-11-24 ENCOUNTER — Telehealth: Payer: Self-pay | Admitting: Family

## 2021-11-24 ENCOUNTER — Ambulatory Visit (HOSPITAL_BASED_OUTPATIENT_CLINIC_OR_DEPARTMENT_OTHER): Payer: Medicare Other | Admitting: Family

## 2021-11-24 VITALS — BP 138/63 | HR 88 | Resp 20 | Wt 121.5 lb

## 2021-11-24 DIAGNOSIS — I5022 Chronic systolic (congestive) heart failure: Secondary | ICD-10-CM | POA: Diagnosis present

## 2021-11-24 DIAGNOSIS — R911 Solitary pulmonary nodule: Secondary | ICD-10-CM

## 2021-11-24 DIAGNOSIS — I10 Essential (primary) hypertension: Secondary | ICD-10-CM

## 2021-11-24 LAB — BASIC METABOLIC PANEL
Anion gap: 9 (ref 5–15)
BUN: 19 mg/dL (ref 8–23)
CO2: 30 mmol/L (ref 22–32)
Calcium: 10 mg/dL (ref 8.9–10.3)
Chloride: 105 mmol/L (ref 98–111)
Creatinine, Ser: 0.9 mg/dL (ref 0.44–1.00)
GFR, Estimated: >60 mL/min (ref >60)
Glucose, Bld: 119 mg/dL — ABNORMAL HIGH (ref 70–99)
Potassium: 4.8 mmol/L (ref 3.5–5.1)
Sodium: 144 mmol/L (ref 135–145)

## 2021-11-24 MED ORDER — FUROSEMIDE 40 MG PO TABS: 20.0000 mg | ORAL_TABLET | Freq: Every day | ORAL | 3 refills | Status: AC

## 2021-11-24 NOTE — Patient Instructions (Signed)
Continue weighing daily and call for an overnight weight gain of 3 pounds or more or a weekly weight gain of more than 5 pounds.   If you have voicemail, please make sure your mailbox is cleaned out so that we may leave a message and please make sure to listen to any voicemails.     

## 2021-11-24 NOTE — Telephone Encounter (Signed)
Attempted to call patient on her cell phone but it would not go through. LM on home number that lab work was normal but that we could go ahead and decrease her furosemide to '20mg'$  daily with an additional '20mg'$  PRN for weight gain, swelling or SOB.   Asked her to call back if there were any questions.

## 2021-11-25 ENCOUNTER — Encounter: Payer: Medicare Other | Admitting: *Deleted

## 2021-11-25 DIAGNOSIS — I5022 Chronic systolic (congestive) heart failure: Secondary | ICD-10-CM

## 2021-11-25 NOTE — Progress Notes (Signed)
Patient ID: Glenda May, female   DOB: 1945-11-22, 76 y.o.   MRN: [id number]  [facility] - PHARMACIST COUNSELING NOTE  Guideline-Directed Medical Therapy/Evidence Based Medicine  ACE/ARB/ARNI:  none - angioedema with ACEi Beta Blocker: Metoprolol succinate 25 mg daily Aldosterone Antagonist: Spironolactone 25 mg daily Diuretic: Furosemide 40 mg daily SGLT2i: Dapagliflozin 10 mg daily  Adherence Assessment  Do you ever forget to take your medication? '[]'$ Yes '[x]'$ No  Do you ever skip doses due to side effects? '[]'$ Yes '[x]'$ No  Do you have trouble affording your medicines? '[]'$ Yes '[x]'$ No  Are you ever unable to pick up your medication due to transportation difficulties? '[]'$ Yes '[x]'$ No  Do you ever stop taking your medications because you don't believe they are helping? '[]'$ Yes '[x]'$ No  Do you check your weight daily? '[x]'$ Yes '[]'$ No   Adherence strategy: none  Barriers to obtaining medications: none  Vital signs: HR 88, BP 128/63, weight (pounds) 121 lb ECHO: Date 08/26/2021, EF 20-25%, notes  mildly elevated PA pressure  and moderate MR.      Latest Ref Rng & Units 11/24/2021    9:07 AM 08/31/2021    5:46 AM 08/30/2021    5:09 AM  BMP  Glucose 70 - 99 mg/dL 119  108  90   BUN 8 - 23 mg/dL 19  19  32   Creatinine 0.44 - 1.00 mg/dL 0.90  0.69  1.06   Sodium 135 - 145 mmol/L 144  133  134   Potassium 3.5 - 5.1 mmol/L 4.8  4.1  4.2   Chloride 98 - 111 mmol/L 105  100  94   CO2 22 - 32 mmol/L '30  25  28   '$ Calcium 8.9 - 10.3 mg/dL 10.0  8.7  8.8     Past Medical History:  Diagnosis Date   Allergic rhinitis    CHF (congestive heart failure) (HCC)    High cholesterol    Hypertension     ASSESSMENT 76 year old female who presents to the HF clinic for follow up. PMH includes hyperlipidemia, HTN, pulmonary nodules and chronic heart failure. Last acute care admission was 08/25/2021 for bronchitis. Last acute care admission was 08/25/2021 for new  onset heart failure.   Medrec was completed. All questions related to medication answered. Dietary modifications were also discussed with emphasis on low sodium diet.  PLAN  Repeat BMET and decrease furosemide to '20mg'$  daily plus '20mg'$  PRN Continue positive lifestyle modifications (low sodium diet) Monitor weight and BP at home.   Time spent: 15 minutes  Glenda May PharmD, BCPS 11/25/2021 2:42 PM     Current Outpatient Medications:    albuterol (VENTOLIN HFA) 108 (90 Base) MCG/ACT inhaler, Inhale 2 puffs into the lungs every 6 (six) hours as needed., Disp: , Rfl:    aspirin EC 81 MG tablet, Take 1 tablet (81 mg total) by mouth daily. Swallow whole., Disp: 30 tablet, Rfl: 12   calcium carbonate (OSCAL) 1500 (600 Ca) MG TABS tablet, Take 600 mg of elemental calcium by mouth 2 (two) times daily with a meal., Disp: , Rfl:    dapagliflozin propanediol (FARXIGA) 10 MG TABS tablet, Take 1 tablet (10 mg total) by mouth daily., Disp: 30 tablet, Rfl: 2   DEXILANT 60 MG capsule, Take 1 capsule by mouth daily., Disp: , Rfl:    furosemide (LASIX) 40 MG tablet, Take 0.5 tablets (20 mg total) by mouth daily. And extra '20mg'$  PRN for weight gain, swelling or SOB,  Disp: 30 tablet, Rfl: 3   loratadine (CLARITIN) 10 MG tablet, Take 10 mg by mouth daily., Disp: , Rfl:    Melatonin 10 MG TBDP, Take 10 mg by mouth at bedtime., Disp: , Rfl:    metoprolol succinate (TOPROL-XL) 25 MG 24 hr tablet, Take 1 tablet (25 mg total) by mouth daily., Disp: 90 tablet, Rfl: 3   Multiple Vitamins-Minerals (WOMENS MULTI GUMMIES PO), Take 1 each by mouth daily., Disp: , Rfl:    nitroGLYCERIN (NITROSTAT) 0.4 MG SL tablet, Place 0.4 mg under the tongue every 5 (five) minutes as needed for chest pain., Disp: , Rfl:    rosuvastatin (CRESTOR) 20 MG tablet, Take 20 mg by mouth daily., Disp: , Rfl:    spironolactone (ALDACTONE) 25 MG tablet, Take 0.5 tablets (12.5 mg total) by mouth daily., Disp: 30 tablet, Rfl:  3   MEDICATION ADHERENCES TIPS AND STRATEGIES Taking medication as prescribed improves patient outcomes in heart failure (reduces hospitalizations, improves symptoms, increases survival) Side effects of medications can be managed by decreasing doses, switching agents, stopping drugs, or adding additional therapy. Please let someone in the [facility] know if you have having bothersome side effects so we can modify your regimen. Do not alter your medication regimen without talking to [place].  Medication reminders can help patients remember to take drugs on time. If you are missing or forgetting doses you can try linking behaviors, using pill boxes, or an electronic reminder like an alarm on your phone or an app. Some people can also get automated phone calls as medication reminders.

## 2021-11-25 NOTE — Progress Notes (Signed)
Daily Session Note  Patient Details  Name: SERINITY WARE MRN: [id number] Date of Birth: [birth year] Referring Provider:   Flowsheet Row Cardiac Rehab from 10/11/2021 in [facility] Cardiac and Pulmonary Rehab  Referring Provider Emily Filbert       Encounter Date: 11/25/2021  Check In:  Session Check In - 11/25/21 1141       Check-In   Supervising physician immediately available to respond to emergencies See telemetry face sheet for immediately available ER MD    Location [facility]-Cardiac & Pulmonary Rehab    Staff Present Renita Papa, RN BSN;Jessica Robinson, MA, RCEP, CCRP, CCET;Joseph Seffner, [place], RN, [place]    Virtual Visit No    Medication changes reported     No    Fall or balance concerns reported    No    Warm-up and Cool-down Performed on first and last piece of equipment    Resistance Training Performed Yes    VAD Patient? No    PAD/SET Patient? No      Pain Assessment   Currently in Pain? No/denies                Social History   Tobacco Use  Smoking Status Never  Smokeless Tobacco Not on file    Goals Met:  Independence with exercise equipment Exercise tolerated well No report of concerns or symptoms today Strength training completed today  Goals Unmet:  Not Applicable  Comments: Pt able to follow exercise prescription today without complaint.  Will continue to monitor for progression.    Dr. Emily Filbert is Medical Director for [facility].  Dr. Ottie Glazier is Medical Director for [facility] Pulmonary Rehabilitation.

## 2021-11-30 ENCOUNTER — Encounter: Payer: Medicare Other | Admitting: *Deleted

## 2021-11-30 DIAGNOSIS — I5022 Chronic systolic (congestive) heart failure: Secondary | ICD-10-CM

## 2021-11-30 NOTE — Progress Notes (Signed)
Daily Session Note  Patient Details  Name: Glenda May MRN: [id number] Date of Birth: July 18, 1945 Referring Provider:   Flowsheet Row Cardiac Rehab from [facility]11/[facility] Cardiac [facility]monary Re[facility]  Referring Provider Emily Filbert       Encounter Date: 11/30/2021  Check In:  Session Check In - 11/30/21 1053       Check-In   Supervising physician immediately available to respond to emergencies See telemetry face sheet for immediately available ER MD    Location [facility]-Cardiac & Pulmonary Rehab    Staff Present Coralie Keens, MS, ASCM CEP, Exercise Physiologist;Jessica Luan Pulling, MA, RCEP, CCRP, CCET;Shamyah Stantz Sherryll Burger, RN BSN    Virtual Visit No    Medication changes reported     No    Fall or balance concerns reported    No    Warm-up and Cool-down Performed on first and last piece of equipment    Resistance Training Performed Yes    VAD Patient? No    PAD/SET Patient? No      Pain Assessment   Currently in Pain? No/denies                Social History   Tobacco Use  Smoking Status Never  Smokeless Tobacco Not on file    Goals Met:  Independence with exercise equipment Exercise tolerated well No report of concerns or symptoms today Strength training completed today  Goals Unmet:  Not Applicable  Comments: Pt able to follow exercise prescription today without complaint.  Will continue to monitor for progression.    Dr. Emily Filbert is Medical Director for [facility].  Dr. Ottie Glazier is Medical Director for [facility] Pulmonary Rehabilitation.

## 2021-12-07 ENCOUNTER — Encounter: Payer: Medicare Other | Admitting: *Deleted

## 2021-12-07 DIAGNOSIS — I5032 Chronic diastolic (congestive) heart failure: Secondary | ICD-10-CM

## 2021-12-07 DIAGNOSIS — I5022 Chronic systolic (congestive) heart failure: Secondary | ICD-10-CM | POA: Diagnosis not present

## 2021-12-07 NOTE — Progress Notes (Signed)
Daily Session Note  Patient Details  Name: Glenda May MRN: [id number] Date of Birth: 07/30/1945 Referring Provider:   Flowsheet Row Cardiac Rehab from 10/11/2021 in [facility] Cardiac and Pulmonary Rehab  Referring Provider Emily Filbert       Encounter Date: 12/07/2021  Check In:  Session Check In - 12/07/21 1112       Check-In   Supervising physician immediately available to respond to emergencies See telemetry face sheet for immediately available ER MD    Location [facility]-Cardiac & Pulmonary Rehab    Staff Present Alberteen Sam, MA, RCEP, CCRP, Marylynn Pearson, MS, ASCM CEP, Exercise Physiologist;Hadlee Burback Sherryll Burger, RN BSN    Virtual Visit No    Medication changes reported     Yes    Comments decreased Lasix from 40 to 93m    Fall or balance concerns reported    No    Warm-up and Cool-down Performed on first and last piece of equipment    Resistance Training Performed Yes    VAD Patient? No    PAD/SET Patient? No      Pain Assessment   Currently in Pain? No/denies                Social History   Tobacco Use  Smoking Status Never  Smokeless Tobacco Not on file    Goals Met:  Independence with exercise equipment Exercise tolerated well No report of concerns or symptoms today Strength training completed today  Goals Unmet:  Not Applicable  Comments: Pt able to follow exercise prescription today without complaint.  Will continue to monitor for progression.    Dr. MEmily Filbertis Medical Director for [place]  Dr. FOttie Glazieris Medical Director [facility].

## 2021-12-08 ENCOUNTER — Encounter: Payer: Self-pay | Admitting: *Deleted

## 2021-12-08 DIAGNOSIS — I5022 Chronic systolic (congestive) heart failure: Secondary | ICD-10-CM

## 2021-12-08 NOTE — Progress Notes (Signed)
Cardiac Individual Treatment Plan  Patient Details  Name: Glenda May MRN: [id number] Date of Birth: May 04, 1945 Referring Provider:   Flowsheet Row Cardiac Rehab from 10/11/2021 in [facility] Cardiac and Pulmonary Rehab  Referring Provider Emily Filbert       Initial Encounter Date:  Flowsheet Row Cardiac Rehab from 10/11/2021 in [facility] Cardiac and Pulmonary Rehab  Date 10/11/21       Visit Diagnosis: Heart failure, chronic systolic ([place])  Patient's Home Medications on Admission:  Current Outpatient Medications:    albuterol (VENTOLIN HFA) 108 (90 Base) MCG/ACT inhaler, Inhale 2 puffs into the lungs every 6 (six) hours as needed., Disp: , Rfl:    aspirin EC 81 MG tablet, Take 1 tablet (81 mg total) by mouth daily. Swallow whole., Disp: 30 tablet, Rfl: 12   calcium carbonate (OSCAL) 1500 (600 Ca) MG TABS tablet, Take 600 mg of elemental calcium by mouth 2 (two) times daily with a meal., Disp: , Rfl:    dapagliflozin propanediol (FARXIGA) 10 MG TABS tablet, Take 1 tablet (10 mg total) by mouth daily., Disp: 30 tablet, Rfl: 2   DEXILANT 60 MG capsule, Take 1 capsule by mouth daily., Disp: , Rfl:    furosemide (LASIX) 40 MG tablet, Take 0.5 tablets (20 mg total) by mouth daily. And extra 78m PRN for weight gain, swelling or SOB, Disp: 30 tablet, Rfl: 3   loratadine (CLARITIN) 10 MG tablet, Take 10 mg by mouth daily., Disp: , Rfl:    Melatonin 10 MG TBDP, Take 10 mg by mouth at bedtime., Disp: , Rfl:    metoprolol succinate (TOPROL-XL) 25 MG 24 hr tablet, Take 1 tablet (25 mg total) by mouth daily., Disp: 90 tablet, Rfl: 3   Multiple Vitamins-Minerals (WOMENS MULTI GUMMIES PO), Take 1 each by mouth daily., Disp: , Rfl:    nitroGLYCERIN (NITROSTAT) 0.4 MG SL tablet, Place 0.4 mg under the tongue every 5 (five) minutes as needed for chest pain., Disp: , Rfl:    rosuvastatin (CRESTOR) 20 MG tablet, Take 20 mg by mouth daily., Disp: , Rfl:    spironolactone (ALDACTONE) 25 MG tablet, Take 0.5  tablets (12.5 mg total) by mouth daily., Disp: 30 tablet, Rfl: 3  Past Medical History: Past Medical History:  Diagnosis Date   Allergic rhinitis    CHF (congestive heart failure) (HCC)    High cholesterol    Hypertension     Tobacco Use: Social History   Tobacco Use  Smoking Status Never  Smokeless Tobacco Not on file    Labs: Review Flowsheet       Latest Ref Rng & Units 08/28/2021  Labs for ITP Cardiac and Pulmonary Rehab  Cholestrol 0 - 200 mg/dL 183   LDL (calc) 0 - 99 mg/dL 97   HDL-C >40 mg/dL 56   Trlycerides <150 mg/dL 151   Hemoglobin A1c 4.8 - 5.6 % 5.9      Exercise Target Goals: Exercise Program Goal: Individual exercise prescription set using results from initial 6 min walk test and THRR while considering  patient's activity barriers and safety.   Exercise Prescription Goal: Initial exercise prescription builds to 30-45 minutes a day of aerobic activity, 2-3 days per week.  Home exercise guidelines will be given to patient during program as part of exercise prescription that the participant will acknowledge.   Education: Aerobic Exercise: - Group verbal and visual presentation on the components of exercise prescription. Introduces F.I.T.T principle from ACSM for exercise prescriptions.  Reviews F.I.T.T. principles of  aerobic exercise including progression. Written material given at graduation. Flowsheet Row Cardiac Rehab from 11/25/2021 in [facility] Cardiac and Pulmonary Rehab  Education need identified 10/11/21       Education: Resistance Exercise: - Group verbal and visual presentation on the components of exercise prescription. Introduces F.I.T.T principle from ACSM for exercise prescriptions  Reviews F.I.T.T. principles of resistance exercise including progression. Written material given at graduation.    Education: Exercise & Equipment Safety: - Individual verbal instruction and demonstration of equipment use and safety with use of the  equipment. Flowsheet Row Cardiac Rehab from 11/25/2021 in [facility] Cardiac and Pulmonary Rehab  Date 10/11/21  Educator [facility]  Instruction Review Code 1- Verbalizes Understanding       Education: Exercise Physiology & General Exercise Guidelines: - Group verbal and written instruction with models to review the exercise physiology of the cardiovascular system and associated critical values. Provides general exercise guidelines with specific guidelines to those with heart or lung disease.  Flowsheet Row Cardiac Rehab from 11/25/2021 in [facility] Cardiac and Pulmonary Rehab  Date 11/11/21  Educator [facility]  Instruction Review Code 1- Verbalizes Understanding       Education: Flexibility, Balance, Mind/Body Relaxation: - Group verbal and visual presentation with interactive activity on the components of exercise prescription. Introduces F.I.T.T principle from ACSM for exercise prescriptions. Reviews F.I.T.T. principles of flexibility and balance exercise training including progression. Also discusses the mind body connection.  Reviews various relaxation techniques to help reduce and manage stress (i.e. Deep breathing, progressive muscle relaxation, and visualization). Balance handout provided to take home. Written material given at graduation.   Activity Barriers & Risk Stratification:  Activity Barriers & Cardiac Risk Stratification - 09/29/21 1534       Activity Barriers & Cardiac Risk Stratification   Activity Barriers Back Problems    Cardiac Risk Stratification High             6 Minute Walk:  6 Minute Walk     Row Name 10/11/21 1515         6 Minute Walk   Phase Initial     Distance 1360 feet     Walk Time 6 minutes     # of Rest Breaks 0     MPH 2.58     METS 2.98     RPE 13     Perceived Dyspnea  1     VO2 Peak 10.44     Symptoms No     Resting HR 83 bpm     Resting BP 124/62     Resting Oxygen Saturation  100 %     Exercise Oxygen Saturation  during 6 min walk 100 %      Max Ex. HR 106 bpm     Max Ex. BP 142/78     2 Minute Post BP 114/62              Oxygen Initial Assessment:   Oxygen Re-Evaluation:   Oxygen Discharge (Final Oxygen Re-Evaluation):   Initial Exercise Prescription:  Initial Exercise Prescription - 10/11/21 1500       Date of Initial Exercise RX and Referring Provider   Date 10/11/21    Referring Provider Emily Filbert      Oxygen   Maintain Oxygen Saturation 88% or higher      Treadmill   MPH 2.3    Grade 0.5    Minutes 15    METs 2.92      Recumbant Bike   Level  2    RPM 50    Minutes 15    METs 2.98      NuStep   Level 2    SPM 80    Minutes 15    METs 2.92      REL-XR   Level 2    Speed 50    Minutes 15    METs 2.92      T5 Nustep   Level 1    SPM 80    Minutes 15    METs 2.92      Biostep-RELP   Level 2    SPM 50    Minutes 15    METs 2.92      Prescription Details   Frequency (times per week) 2    Duration Progress to 30 minutes of continuous aerobic without signs/symptoms of physical distress      Intensity   THRR 40-80% of Max Heartrate 107-130    Ratings of Perceived Exertion 11-13    Perceived Dyspnea 0-4      Progression   Progression Continue to progress workloads to maintain intensity without signs/symptoms of physical distress.      Resistance Training   Training Prescription Yes    Weight 3    Reps 10-15             Perform Capillary Blood Glucose checks as needed.  Exercise Prescription Changes:   Exercise Prescription Changes     Row Name 10/11/21 1500 10/28/21 1100 10/28/21 1600 11/09/21 1500 11/24/21 1300     Response to Exercise   Blood Pressure (Admit) 124/62 -- 112/62 118/62 134/76   Blood Pressure (Exercise) 142/78 -- 140/66 132/58 --   Blood Pressure (Exit) 114/62 -- 104/62 122/62 124/78   Heart Rate (Admit) 83 bpm -- 82 bpm 81 bpm 78 bpm   Heart Rate (Exercise) 106 bpm -- 125 bpm 127 bpm 124 bpm   Heart Rate (Exit) 84 bpm -- 90 bpm 97 bpm 85  bpm   Oxygen Saturation (Admit) 100 % -- -- -- --   Oxygen Saturation (Exercise) 100 % -- -- -- --   Oxygen Saturation (Exit) 100 % -- -- -- --   Rating of Perceived Exertion (Exercise) 13 -- _0 Perceived Dyspnea (Exercise) 1 -- 0 -- --   Symptoms none -- none none none   Comments 6 MWT results -- First 3 days of rehab -- --   Duration -- -- Continue with 30 min of aerobic exercise without signs/symptoms of physical distress. Continue with 30 min of aerobic exercise without signs/symptoms of physical distress. Continue with 30 min of aerobic exercise without signs/symptoms of physical distress.   Intensity -- -- THRR unchanged THRR unchanged THRR unchanged     Progression   Progression -- -- Continue to progress workloads to maintain intensity without signs/symptoms of physical distress. Continue to progress workloads to maintain intensity without signs/symptoms of physical distress. Continue to progress workloads to maintain intensity without signs/symptoms of physical distress.   Average METs -- -- 2.74 3.83 3.19     Resistance Training   Training Prescription -- -- Yes Yes Yes   Weight -- -- 3 lb 3 lb 3 lb   Reps -- -- 10-15 10-15 10-15     Interval Training   Interval Training -- -- No No No     Treadmill   MPH -- -- 2.3 3 2.5   Grade -- -- _1 Minutes -- --  _0 METs -- -- 3.08 3.71 3.6     Recumbant Bike   Level -- -- -- 4 3   Minutes -- -- -- 15 15   METs -- -- -- 4.03 4.03     NuStep   Level -- -- -- 4 3   Minutes -- -- -- 15 15   METs -- -- -- 2.7 2.9     REL-XR   Level -- -- 2 5 --   Minutes -- -- 15 15 --   METs -- -- 2.9 5.7 --     T5 Nustep   Level -- -- 1 -- --   Minutes -- -- 15 -- --   METs -- -- 2.1 -- --     Biostep-RELP   Level -- -- _1 Minutes -- -- _2 METs -- -- _3 Track   Laps -- -- 32 30 37   Minutes -- -- _4 METs -- -- 2.63 2.63 3.28     Home Exercise Plan   Plans to continue exercise at  -- Home (comment)  walking, staff videos Home (comment)  walking, staff videos Home (comment)  walking, staff videos Home (comment)  walking, staff videos   Frequency -- Add 3 additional days to program exercise sessions. Add 3 additional days to program exercise sessions. Add 3 additional days to program exercise sessions. Add 3 additional days to program exercise sessions.   Initial Home Exercises Provided -- 10/28/21 10/28/21 10/28/21 10/28/21     Oxygen   Maintain Oxygen Saturation -- -- 88% or higher 88% or higher 88% or higher    Row Name 12/07/21 1500             Response to Exercise   Blood Pressure (Admit) 100/60       Blood Pressure (Exit) 124/60       Heart Rate (Admit) 81 bpm       Heart Rate (Exercise) 121 bpm       Heart Rate (Exit) 94 bpm       Rating of Perceived Exertion (Exercise) 15       Symptoms none       Duration Continue with 30 min of aerobic exercise without signs/symptoms of physical distress.       Intensity THRR unchanged         Progression   Progression Continue to progress workloads to maintain intensity without signs/symptoms of physical distress.       Average METs 4.34         Resistance Training   Training Prescription Yes       Weight 3 lb       Reps 10-15         Interval Training   Interval Training No         Treadmill   MPH 3.2       Grade 5       Minutes 15       METs 5.66         Recumbant Bike   Level 5       Watts 34       Minutes 15       METs 4.04         REL-XR   Level 5       Minutes 15       METs 5.3  Track   Laps 40       Minutes 15       METs 3.18         Home Exercise Plan   Plans to continue exercise at Home (comment)  walking, staff videos       Frequency Add 3 additional days to program exercise sessions.       Initial Home Exercises Provided 10/28/21         Oxygen   Maintain Oxygen Saturation 88% or higher                Exercise Comments:   Exercise Comments     Row Name  10/12/21 1117           Exercise Comments First full day of exercise!  Patient was oriented to gym and equipment including functions, settings, policies, and procedures.  Patient's individual exercise prescription and treatment plan were reviewed.  All starting workloads were established based on the results of the 6 minute walk test done at initial orientation visit.  The plan for exercise progression was also introduced and progression will be customized based on patient's performance and goals                Exercise Goals and Review:   Exercise Goals     Row Name 10/11/21 1522             Exercise Goals   Increase Physical Activity Yes       Intervention Provide advice, education, support and counseling about physical activity/exercise needs.       Expected Outcomes Short Term: Attend rehab on a regular basis to increase amount of physical activity.;Long Term: Add in home exercise to make exercise part of routine and to increase amount of physical activity.;Long Term: Exercising regularly at least 3-5 days a week.       Increase Strength and Stamina Yes       Intervention Develop an individualized exercise prescription for aerobic and resistive training based on initial evaluation findings, risk stratification, comorbidities and participant's personal goals.;Provide advice, education, support and counseling about physical activity/exercise needs.       Expected Outcomes Short Term: Increase workloads from initial exercise prescription for resistance, speed, and METs.;Short Term: Perform resistance training exercises routinely during rehab and add in resistance training at home;Long Term: Improve cardiorespiratory fitness, muscular endurance and strength as measured by increased METs and functional capacity (6MWT)       Able to understand and use rate of perceived exertion (RPE) scale Yes       Intervention Provide education and explanation on how to use RPE scale       Expected  Outcomes Short Term: Able to use RPE daily in rehab to express subjective intensity level;Long Term:  Able to use RPE to guide intensity level when exercising independently       Able to understand and use Dyspnea scale Yes       Intervention Provide education and explanation on how to use Dyspnea scale       Expected Outcomes Short Term: Able to use Dyspnea scale daily in rehab to express subjective sense of shortness of breath during exertion;Long Term: Able to use Dyspnea scale to guide intensity level when exercising independently       Knowledge and understanding of Target Heart Rate Range (THRR) Yes       Intervention Provide education and explanation of THRR including how the numbers were predicted and where they  are located for reference       Expected Outcomes Short Term: Able to state/look up THRR;Short Term: Able to use daily as guideline for intensity in rehab;Long Term: Able to use THRR to govern intensity when exercising independently       Able to check pulse independently Yes       Intervention Provide education and demonstration on how to check pulse in carotid and radial arteries.;Review the importance of being able to check your own pulse for safety during independent exercise       Expected Outcomes Short Term: Able to explain why pulse checking is important during independent exercise;Long Term: Able to check pulse independently and accurately       Understanding of Exercise Prescription Yes       Intervention Provide education, explanation, and written materials on patient's individual exercise prescription       Expected Outcomes Short Term: Able to explain program exercise prescription;Long Term: Able to explain home exercise prescription to exercise independently                Exercise Goals Re-Evaluation :  Exercise Goals Re-Evaluation     Row Name 10/12/21 1117 10/28/21 1108 10/28/21 1607 11/09/21 1601 11/18/21 1416     Exercise Goal Re-Evaluation   Exercise  Goals Review Increase Physical Activity;Able to understand and use rate of perceived exertion (RPE) scale;Knowledge and understanding of Target Heart Rate Range (THRR);Understanding of Exercise Prescription;Increase Strength and Stamina;Able to check pulse independently Increase Physical Activity;Able to understand and use rate of perceived exertion (RPE) scale;Knowledge and understanding of Target Heart Rate Range (THRR);Understanding of Exercise Prescription;Increase Strength and Stamina;Able to check pulse independently;Able to understand and use Dyspnea scale Increase Physical Activity;Understanding of Exercise Prescription;Increase Strength and Stamina Increase Physical Activity;Understanding of Exercise Prescription;Increase Strength and Stamina Increase Physical Activity;Understanding of Exercise Prescription;Increase Strength and Stamina   Comments Reviewed RPE and dyspnea scales, THR and program prescription with pt today.  Pt voiced understanding and was given a copy of goals to take home. Glenda May is doing well in rehab.  She has noted that her stamina is getting better.  She is already walking on her off days.  Reviewed home exercise with pt today.  Pt plans to continues walking at home/park for exercise.  We also talked about using our staff videos as well.  Reviewed THR, pulse, RPE, sign and symptoms, pulse oximetery and when to call 911 or MD.  Also discussed weather considerations and indoor options.  Pt voiced understanding. Glenda May is doing well in rehab. She had an overall average MET level of 2.74 METs. She got up to 32 laps on the track and improved to level 3 on the biostep. We will continue to monitor her progress in the program. Glenda May continues to do well in rehab. she has significantly increased most of her workloads; specifically, she went to level 4 on both the T4 Nustep and recumbent bike. She also increased to level on the XR working up to almost 6 METS! She would benefit from  increasing her handweights to 4 lbs. Will continue to monitor. Glenda May is exercising at home and walking on her off days from rehab. She walks on the track for about 30 minutes at one time. 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  We reviewed her THR and encouraged to check her HR while exercising. She is encouraged to buy a pulse ox. She  is also starting to do bodyweight strength exercises to incorporate her resistance training.   Expected Outcomes Short: Use RPE daily to regulate intensity. Long: Follow program prescription in THR. Short: Conitnue to walk on off days Long: Continue to improve stamina Short: Continue to attend cardiac rehab and follow current exercise prescription. Long: Continue to increase strength and stamina. Short: Increase resistance training to 4 lbs Long: Continue to increase overall MET level Short: Buy pulse ox, start checking HR Long: continue to exercise independently    Row Name 11/24/21 1347 12/07/21 1546           Exercise Goal Re-Evaluation   Exercise Goals Review Increase Physical Activity;Understanding of Exercise Prescription;Increase Strength and Stamina Increase Physical Activity;Understanding of Exercise Prescription;Increase Strength and Stamina      Comments Glenda May is doing well in rehab. She has consistently walked at an overall average MET level above 3 METs. She also increased her incline on the treadmill to 2% while maintaining a speed of 2.5 mph. She walked up to 37 laps on the track as well. We will continue to monitor her progress in the program. Glenda May continues to do well in rehab. She significantly increased her treadmill workload to a 3.2 mph speed/5 % incline, working over 5 METS. She also hit  a max of 40 laps on the track. She worked on the recumbent bike after a while and was able to work at 34 watts. She would benefit from increasing to 4 lbs for handweights. Will continue to  monitor.      Expected Outcomes Short: Continue to increase workloads as tolerated. Long: Continue to increase strength and stamina. Short: Increase to 4 lb handweights Long: Continue to increase overall MET level               Discharge Exercise Prescription (Final Exercise Prescription Changes):  Exercise Prescription Changes - 12/07/21 1500       Response to Exercise   Blood Pressure (Admit) 100/60    Blood Pressure (Exit) 124/60    Heart Rate (Admit) 81 bpm    Heart Rate (Exercise) 121 bpm    Heart Rate (Exit) 94 bpm    Rating of Perceived Exertion (Exercise) 15    Symptoms none    Duration Continue with 30 min of aerobic exercise without signs/symptoms of physical distress.    Intensity THRR unchanged      Progression   Progression Continue to progress workloads to maintain intensity without signs/symptoms of physical distress.    Average METs 4.34      Resistance Training   Training Prescription Yes    Weight 3 lb    Reps 10-15      Interval Training   Interval Training No      Treadmill   MPH 3.2    Grade 5    Minutes 15    METs 5.66      Recumbant Bike   Level 5    Watts 34    Minutes 15    METs 4.04      REL-XR   Level 5    Minutes 15    METs 5.3      Track   Laps 40    Minutes 15    METs 3.18      Home Exercise Plan   Plans to continue exercise at Home (comment)   walking, staff videos   Frequency Add 3 additional days to program exercise sessions.    Initial Home Exercises Provided 10/28/21  Oxygen   Maintain Oxygen Saturation 88% or higher             Nutrition:  Target Goals: Understanding of nutrition guidelines, daily intake of sodium <1522m, cholesterol <2078m calories 30% from fat and 7% or less from saturated fats, daily to have 5 or more servings of fruits and vegetables.  Education: All About Nutrition: -Group instruction provided by verbal, written material, interactive activities, discussions, models, and posters  to present general guidelines for heart healthy nutrition including fat, fiber, MyPlate, the role of sodium in heart healthy nutrition, utilization of the nutrition label, and utilization of this knowledge for meal planning. Follow up email sent as well. Written material given at graduation. [facility]  Education need identified 10/11/21  Date 11/25/21  Educator [place] Code 1- Verbalizes Understanding       Biometrics:  Pre Biometrics - 10/11/21 1522       Pre Biometrics   Height 5' 2.75" (1.594 m)    Weight 121 lb 1.6 oz (54.9 kg)    Waist Circumference 30 inches    Hip Circumference 36.5 inches    Waist to Hip Ratio 0.82 %    BMI (Calculated) 21.62    Single Leg Stand 6.05 seconds              Nutrition Therapy Plan and Nutrition Goals:  Nutrition Therapy & Goals - 10/11/21 1525       Intervention Plan   Intervention Prescribe, educate and counsel regarding individualized specific dietary modifications aiming towards targeted core components such as weight, hypertension, lipid management, diabetes, heart failure and other comorbidities.    Expected Outcomes Short Term Goal: Understand basic principles of dietary content, such as calories, fat, sodium, cholesterol and nutrients.;Short Term Goal: A plan has been developed with personal nutrition goals set during dietitian appointment.;Long Term Goal: Adherence to prescribed nutrition plan.             Nutrition Assessments:  MEDIFICTS Score Key: ?70 Need to make dietary changes  40-70 Heart Healthy Diet ? 40 Therapeutic Level Cholesterol Diet  [facility]  Picture Your Plate Total Score on Admission 85      Picture Your Plate Scores: <4<62nhealthy dietary pattern with much room for improvement. 41-50 Dietary pattern unlikely to meet recommendations for good  health and room for improvement. 51-60 More healthful dietary pattern, with some room for improvement.  >60 Healthy dietary pattern, although there may be some specific behaviors that could be improved.    Nutrition Goals Re-Evaluation:  Nutrition Goals Re-Evaluation     [place] 10/28/21 1124 11/18/21 1423           Goals   Nutrition Goal ST: add protein and fat to snack - almonds with fruit, add additional snack mid-afternoon - greek yogurt with fruit. LT: Gain 10 pounds, continue with heart healthy changes, meet energy and protein needs ST: add protein and fat to snack - almonds with fruit, add additional snack mid-afternoon - greek yogurt with fruit. LT: Gain 10 pounds, continue with heart healthy changes, meet energy and protein needs      Comment MaChanels doing well with her diet.  She is using her air fryer, grill, and baking.  She is eating chicken and fish.  She is doiing better with staying away from fried foods.  She has been watching her  salt.  She is working to increase her protein intake.  She is still trying to gain weight. Kelbie has been trying to add more protein in her diet. She has incorporated more peanut butter, nuts, greek yogurt, and fish into her diet. She is eating a rainbow of fruits which she loves. She has cut out a lot of sodium and fried foods. She is trying to gain some weight back but also trying to eat the appropriate things that are heart healthy.      Expected Outcome Short: Continue to add in more protein Long: Continue to focus on heart healthy diet Short: Continue to follow RD recommendations Long: Continue to eat heart healthy, work toward 10 lb weight gain               Nutrition Goals Discharge (Final Nutrition Goals Re-Evaluation):  Nutrition Goals Re-Evaluation - 11/18/21 1423       Goals   Nutrition Goal ST: add protein and fat to snack - almonds with fruit, add additional snack mid-afternoon - greek yogurt with fruit. LT: Gain 10 pounds,  continue with heart healthy changes, meet energy and protein needs    Comment Glenda May has been trying to add more protein in her diet. She has incorporated more peanut butter, nuts, greek yogurt, and fish into her diet. She is eating a rainbow of fruits which she loves. She has cut out a lot of sodium and fried foods. She is trying to gain some weight back but also trying to eat the appropriate things that are heart healthy.    Expected Outcome Short: Continue to follow RD recommendations Long: Continue to eat heart healthy, work toward 10 lb weight gain             Psychosocial: Target Goals: Acknowledge presence or absence of significant depression and/or stress, maximize coping skills, provide positive support system. Participant is able to verbalize types and ability to use techniques and skills needed for reducing stress and depression.   Education: Stress, Anxiety, and Depression - Group verbal and visual presentation to define topics covered.  Reviews how body is impacted by stress, anxiety, and depression.  Also discusses healthy ways to reduce stress and to treat/manage anxiety and depression.  Written material given at graduation. Flowsheet Row Cardiac Rehab from 11/25/2021 in [facility] Cardiac and Pulmonary Rehab  Date 11/04/21  Educator [facility]  Instruction Review Code 1- [employer] Understanding       Education: Sleep Hygiene -Provides group verbal and written instruction about how sleep can affect your health.  Define sleep hygiene, discuss sleep cycles and impact of sleep habits. Review good sleep hygiene tips.    Initial Review & Psychosocial Screening:  Initial Psych Review & Screening - 09/29/21 1540       Initial Review   Current issues with Current Stress Concerns;Current Sleep Concerns    Source of Stress Concerns Chronic Illness      Family Dynamics   Good Support System? Yes   family     Barriers   Psychosocial barriers to participate in program There are no  identifiable barriers or psychosocial needs.;The patient should benefit from training in stress management and relaxation.      Screening Interventions   Interventions Encouraged to exercise;Provide feedback about the scores to participant;To provide support and resources with identified psychosocial needs    Expected Outcomes Short Term goal: Utilizing psychosocial counselor, staff and physician to assist with identification of specific Stressors or current issues interfering with healing process.  Setting desired goal for each stressor or current issue identified.;Long Term Goal: Stressors or current issues are controlled or eliminated.;Short Term goal: Identification and review with participant of any Quality of Life or Depression concerns found by scoring the questionnaire.;Long Term goal: The participant improves quality of Life and PHQ9 Scores as seen by post scores and/or verbalization of changes             Quality of Life Scores:   Quality of Life - 10/11/21 1527       Quality of Life   Select Quality of Life      Quality of Life Scores   Health/Function Pre 22.7 %    Socioeconomic Pre 26.14 %    Psych/Spiritual Pre 24.86 %    Family Pre 25.2 %    GLOBAL Pre 24.22 %            Scores of 19 and below usually indicate a poorer quality of life in these areas.  A difference of  2-3 points is a clinically meaningful difference.  A difference of 2-3 points in the total score of the Quality of Life Index has been associated with significant improvement in overall quality of life, self-image, physical symptoms, and general health in studies assessing change in quality of life.  PHQ-9: Review Flowsheet       11/16/2021 11/09/2021 10/14/2021 10/11/2021  Depression screen PHQ 2/9  Decreased Interest 0 0 0 3  Down, Depressed, Hopeless 0 0 1 1  PHQ - 2 Score 0 0 1 4  Altered sleeping 1 1 - 1  Tired, decreased energy 0 1 - 1  Change in appetite 0 0 - 1  Feeling bad or failure  about yourself  0 0 - 0  Trouble concentrating 1 1 - 1  Moving slowly or fidgety/restless 0 0 - 1  Suicidal thoughts 0 0 - 0  PHQ-9 Score 2 3 - 9  Difficult doing work/chores - Somewhat difficult - Somewhat difficult   Interpretation of Total Score  Total Score Depression Severity:  1-4 = Minimal depression, 5-9 = Mild depression, 10-14 = Moderate depression, 15-19 = Moderately severe depression, 20-27 = Severe depression   Psychosocial Evaluation and Intervention:  Psychosocial Evaluation - 09/29/21 1549       Psychosocial Evaluation & Interventions   Interventions Encouraged to exercise with the program and follow exercise prescription;Stress management education;Relaxation education    Comments Ms. Glenda May was surprised her heart was having issues and is really wanting to gain more knowledge about her heart failure diagnosis. She states she has a history of bronchitis and asthma, but never thought her heart would be giving her issues. She has changed her diet and is starting to exercise more. Her sleep has been a struggle for a while, in that she wakes up early morning and has a hard time falling back asleep. She has talked with her doctor about this and is trying to still lay there and rest to help her heart. Her family is involved in her care.    Expected Outcomes Short: attend cardiac rehab for education. Long: develop and maintain positive self care habits.    Continue Psychosocial Services  Follow up required by staff             Psychosocial Re-Evaluation:  Psychosocial Re-Evaluation     [place] Name 10/28/21 1115 11/18/21 1425           Psychosocial Re-Evaluation   Current issues with Current Stress Concerns;Current Sleep Concerns  Current Stress Concerns;Current Sleep Concerns      Comments Glenda May  is doing well in rehab.  Her biggest stressor is her health. She does wake up around 3-4am for an hour and then finally back to sleep.  She has talked to her doctor about it  and they want to do a sleep study with her.  Her sister  that she was helping to feed has now reached the point for Hospice.  She is 68 and not a candidate for dialysis.  She is unable to care for herself.  They all take turns watching her and helping.  Glenda May is adjusting to knowing about her heart failure.  She wants to work to get her EF back up and get better to keep going.  She is scheduled for an MRI in January and considering an ICD. Glenda May is still struggling with sleep. She has yet to schedule her sleep study and was encouraged to call again. Her doctor told her to try Melatonin extended release versus regular to see if that helps too. She is still caring for her sister at her assisted home which can be stressful sometimes for her. She has been enjoying the program so far and hopes to keep up her strength and improve her EF.      Expected Outcomes Short: Get sleep study Long: Continue to exericse to for mental boost Short: Schedule sleep study, talk to doctor about options to help with sleep Long: Continue to maintain positive attitude and utilize exercise for stress management      Interventions Encouraged to attend Cardiac Rehabilitation for the exercise Encouraged to attend Cardiac Rehabilitation for the exercise      Continue Psychosocial Services  Follow up required by staff Follow up required by staff               Psychosocial Discharge (Final Psychosocial Re-Evaluation):  Psychosocial Re-Evaluation - 11/18/21 1425       Psychosocial Re-Evaluation   Current issues with Current Stress Concerns;Current Sleep Concerns    Comments Glenda May is still struggling with sleep. She has yet to schedule her sleep study and was encouraged to call again. Her doctor told her to try Melatonin extended release versus regular to see if that helps too. She is still caring for her sister at her assisted home which can be stressful sometimes for her. She has been enjoying the program so far and hopes  to keep up her strength and improve her EF.    Expected Outcomes Short: Schedule sleep study, talk to doctor about options to help with sleep Long: Continue to maintain positive attitude and utilize exercise for stress management    Interventions Encouraged to attend Cardiac Rehabilitation for the exercise    Continue Psychosocial Services  Follow up required by staff             Vocational Rehabilitation: Provide vocational rehab assistance to qualifying candidates.   Vocational Rehab Evaluation & Intervention:  Vocational Rehab - 09/29/21 1540       Initial Vocational Rehab Evaluation & Intervention   Assessment shows need for Vocational Rehabilitation No             Education: Education Goals: Education classes will be provided on a variety of topics geared toward better understanding of heart health and risk factor modification. Participant will state understanding/return demonstration of topics presented as noted by education test scores.  Learning Barriers/Preferences:  Learning Barriers/Preferences - 09/29/21 1538       Learning  Barriers/Preferences   Learning Barriers None    Learning Preferences Individual Instruction             General Cardiac Education Topics:  AED/CPR: - Group verbal and written instruction with the use of models to demonstrate the basic use of the AED with the basic ABC's of resuscitation.   Anatomy and Cardiac Procedures: - Group verbal and visual presentation and models provide information about basic cardiac anatomy and function. Reviews the testing methods done to diagnose heart disease and the outcomes of the test results. Describes the treatment choices: Medical Management, Angioplasty, or Coronary Bypass Surgery for treating various heart conditions including Myocardial Infarction, Angina, Valve Disease, and Cardiac Arrhythmias.  Written material given at graduation.   Medication Safety: - Group verbal and visual instruction  to review commonly prescribed medications for heart and lung disease. Reviews the medication, class of the drug, and side effects. Includes the steps to properly store meds and maintain the prescription regimen.  Written material given at graduation.   Intimacy: - Group verbal instruction through game format to discuss how heart and lung disease can affect sexual intimacy. Written material given at graduation.. Flowsheet Row Cardiac Rehab from 11/25/2021 in [facility] Cardiac and Pulmonary Rehab  Date 11/18/21  Educator [facility]  Instruction Review Code 1- Verbalizes Understanding       Know Your Numbers and Heart Failure: - Group verbal and visual instruction to discuss disease risk factors for cardiac and pulmonary disease and treatment options.  Reviews associated critical values for Overweight/Obesity, Hypertension, Cholesterol, and Diabetes.  Discusses basics of heart failure: signs/symptoms and treatments.  Introduces Heart Failure Zone chart for action plan for heart failure.  Written material given at graduation. Flowsheet Row Cardiac Rehab from 11/25/2021 in [facility] Cardiac and Pulmonary Rehab  Date 10/21/21  Educator [facility]  Instruction Review Code 1- Verbalizes Understanding       Infection Prevention: - Provides verbal and written material to individual with discussion of infection control including proper hand washing and proper equipment cleaning during exercise session. Flowsheet Row Cardiac Rehab from 11/25/2021 in [facility] Cardiac and Pulmonary Rehab  Date 10/11/21  Educator [facility]  Instruction Review Code 1- Verbalizes Understanding       Falls Prevention: - Provides verbal and written material to individual with discussion of falls prevention and safety. Flowsheet Row Cardiac Rehab from 11/25/2021 in [facility] Cardiac and Pulmonary Rehab  Date 10/11/21  Educator [facility]  Instruction Review Code 1- Verbalizes Understanding       Other: -Provides group and verbal instruction on various topics  (see comments)   Knowledge Questionnaire Score:  Knowledge Questionnaire Score - 10/11/21 1527       Knowledge Questionnaire Score   Pre Score 24/26             Core Components/Risk Factors/Patient Goals at Admission:  Personal Goals and Risk Factors at Admission - 10/11/21 1525       Core Components/Risk Factors/Patient Goals on Admission    Weight Management Yes    Intervention Weight Management: Develop a combined nutrition and exercise program designed to reach desired caloric intake, while maintaining appropriate intake of nutrient and fiber, sodium and fats, and appropriate energy expenditure required for the weight goal.;Weight Management: Provide education and appropriate resources to help participant work on and attain dietary goals.    Admit Weight 121 lb 1.6 oz (54.9 kg)    Goal Weight: Short Term 125 lb (56.7 kg)    Goal Weight: Long Term 130 lb (  59 kg)    Expected Outcomes Short Term: Continue to assess and modify interventions until short term weight is achieved;Long Term: Adherence to nutrition and physical activity/exercise program aimed toward attainment of established weight goal;Understanding recommendations for meals to include 15-35% energy as protein, 25-35% energy from fat, 35-60% energy from carbohydrates, less than 21m of dietary cholesterol, 20-35 gm of total fiber daily;Understanding of distribution of calorie intake throughout the day with the consumption of 4-5 meals/snacks;Weight Gain: Understanding of general recommendations for a high calorie, high protein meal plan that promotes weight gain by distributing calorie intake throughout the day with the consumption for 4-5 meals, snacks, and/or supplements    Heart Failure Yes    Intervention Provide a combined exercise and nutrition program that is supplemented with education, support and counseling about heart failure. Directed toward relieving symptoms such as shortness of breath, decreased exercise  tolerance, and extremity edema.    Expected Outcomes Improve functional capacity of life;Short term: Attendance in program 2-3 days a week with increased exercise capacity. Reported lower sodium intake. Reported increased fruit and vegetable intake. Reports medication compliance.;Short term: Daily weights obtained and reported for increase. Utilizing diuretic protocols set by physician.;Long term: Adoption of self-care skills and reduction of barriers for early signs and symptoms recognition and intervention leading to self-care maintenance.    Hypertension Yes    Intervention Provide education on lifestyle modifcations including regular physical activity/exercise, weight management, moderate sodium restriction and increased consumption of fresh fruit, vegetables, and low fat dairy, alcohol moderation, and smoking cessation.;Monitor prescription use compliance.    Expected Outcomes Short Term: Continued assessment and intervention until BP is < 140/967mHG in hypertensive participants. < 130/8027mG in hypertensive participants with diabetes, heart failure or chronic kidney disease.;Long Term: Maintenance of blood pressure at goal levels.    Lipids Yes    Intervention Provide education and support for participant on nutrition & aerobic/resistive exercise along with prescribed medications to achieve LDL <31m66mDL >40mg77m Expected Outcomes Short Term: Participant states understanding of desired cholesterol values and is compliant with medications prescribed. Participant is following exercise prescription and nutrition guidelines.;Long Term: Cholesterol controlled with medications as prescribed, with individualized exercise RX and with personalized nutrition plan. Value goals: LDL < 31mg,52m > 40 mg.             Education:Diabetes - Individual verbal and written instruction to review signs/symptoms of diabetes, desired ranges of glucose level fasting, after meals and with exercise. Acknowledge that  pre and post exercise glucose checks will be done for 3 sessions at entry of program.   Core Components/Risk Factors/Patient Goals Review:   Goals and Risk Factor Review     Row Name 10/28/21 1126 11/18/21 1428           Core Components/Risk Factors/Patient Goals Review   Personal Goals Review Weight Management/Obesity;Heart Failure;Hypertension;Lipids Weight Management/Obesity;Heart Failure;Hypertension      Review MargarJaneciaing well in rehab.  She has started to gain again and up 2 lb.  Her blood pressures and blood sugars are doing well and she checks them at home routinely.  She denies any heart failure symptoms.  She will have her echo in January for ICD consideration.  We talked about what the ICD looks like and what it does for her.  She is doing well on her medications. Glenda May to do well. She is trying to gain 10 lbs in total as her weight goal and is trying to  change her diet to gear more towards heart healthy but also enough to promote weight gain. She is incorporating more protein in her diet. She denies any symptoms with heart failure and is aware to make note of any sudden weight gain if she sees any on the scale. She is anxious to see how her EF is improving with exercise and will be re-evaluated for an ICD in January, pending on her test results. All in all, she checks her BP almost everyday and stays stable, anywhere between 110-120s/ 60-70s.      Expected Outcomes Short: Continue to work on weight gain Long: Continue to monitor risk factors. Short: Continue to monitor weight Long: Hit long-term weight gain goal of 10+ lb.               Core Components/Risk Factors/Patient Goals at Discharge (Final Review):   Goals and Risk Factor Review - 11/18/21 1428       Core Components/Risk Factors/Patient Goals Review   Personal Goals Review Weight Management/Obesity;Heart Failure;Hypertension    Review Glenda May continues to do well. She is trying to gain 10 lbs in  total as her weight goal and is trying to change her diet to gear more towards heart healthy but also enough to promote weight gain. She is incorporating more protein in her diet. She denies any symptoms with heart failure and is aware to make note of any sudden weight gain if she sees any on the scale. She is anxious to see how her EF is improving with exercise and will be re-evaluated for an ICD in January, pending on her test results. All in all, she checks her BP almost everyday and stays stable, anywhere between 110-120s/ 60-70s.    Expected Outcomes Short: Continue to monitor weight Long: Hit long-term weight gain goal of 10+ lb.             ITP Comments:  ITP Comments     Row Name 09/29/21 1539 10/11/21 1514 10/12/21 1116 10/13/21 0704 11/10/21 0920   ITP Comments Initial telephone orientation completed. Diagnosis can be found in [facility] 8/16. EP orientation scheduled for Monday 10/2 at 1:30pm. Completed 6MWT and gym orientation. Initial ITP created and sent for review to Dr. Emily Filbert, Medical Director. First full day of exercise!  Patient was oriented to gym and equipment including functions, settings, policies, and procedures.  Patient's individual exercise prescription and treatment plan were reviewed.  All starting workloads were established based on the results of the 6 minute walk test done at initial orientation visit.  The plan for exercise progression was also introduced and progression will be customized based on patient's performance and goals 30 Day review completed. Medical Director ITP review done, changes made as directed, and signed approval by Medical Director.   New to program 30 Day review completed. Medical Director ITP review done, changes made as directed, and signed approval by Medical Director.    [place] Name 12/08/21 0752           ITP Comments 30 Day review completed. Medical Director ITP review done, changes made as directed, and signed approval by Medical Director.                 Comments:

## 2021-12-09 ENCOUNTER — Encounter: Payer: Medicare Other | Admitting: *Deleted

## 2021-12-09 DIAGNOSIS — I5022 Chronic systolic (congestive) heart failure: Secondary | ICD-10-CM

## 2021-12-09 NOTE — Progress Notes (Signed)
Daily Session Note  Patient Details  Name: Glenda May MRN: [id number] Date of Birth: 02-Feb-1945 Referring Provider:   Flowsheet Row Cardiac Rehab from 10/11/2021 in [facility] Cardiac and Pulmonary Rehab  Referring Provider Emily Filbert       Encounter Date: 12/09/2021  Check In:  Session Check In - 12/09/21 1156       Check-In   Supervising physician immediately available to respond to emergencies See telemetry face sheet for immediately available ER MD    Location [facility]-Cardiac & Pulmonary Rehab    Staff Present Heath Lark, RN, BSN, CCRP;Noah Tickle, BS, Exercise Physiologist;Joseph [place], RCP,RRT,BSRT;Meredith Sherryll Burger, RN BSN    Virtual Visit No    Medication changes reported     No    Fall or balance concerns reported    No    Warm-up and Cool-down Performed on first and last piece of equipment    Resistance Training Performed Yes    VAD Patient? No    PAD/SET Patient? No      Pain Assessment   Currently in Pain? No/denies                Social History   Tobacco Use  Smoking Status Never  Smokeless Tobacco Not on file    Goals Met:  Independence with exercise equipment Exercise tolerated well No report of concerns or symptoms today  Goals Unmet:  Not Applicable  Comments: Pt able to follow exercise prescription today without complaint.  Will continue to monitor for progression.    Dr. Emily Filbert is Medical Director for [place].  Dr. Ottie Glazier is Medical Director for [facility] Pulmonary Rehabilitation.

## 2021-12-13 ENCOUNTER — Ambulatory Visit (LOCAL_COMMUNITY_HEALTH_CENTER): Payer: Medicare Other

## 2021-12-13 DIAGNOSIS — Z7185 Encounter for immunization safety counseling: Secondary | ICD-10-CM

## 2021-12-13 DIAGNOSIS — Z23 Encounter for immunization: Secondary | ICD-10-CM

## 2021-12-13 NOTE — Progress Notes (Signed)
  Are you feeling sick today? No   Have you ever received a dose of COVID-19 Vaccine? [employer], [place], [place], [place], Other) Yes  If yes, which vaccine and how many doses?   Moderna and 5 doses   Did you bring the vaccination record card or other documentation?  No   Do you have a health condition or are undergoing treatment that makes you moderately or severely immunocompromised? This would include, but not be limited to: cancer, HIV, organ transplant, immunosuppressive therapy/high-dose corticosteroids, or moderate/severe primary immunodeficiency.  No  Have you received COVID-19 vaccine before or during hematopoietic cell transplant (HCT) or CAR-T-cell therapies? No  Have you ever had an allergic reaction to: (This would include a severe allergic reaction or a reaction that caused hives, swelling, or respiratory distress, including wheezing.) A component of a COVID-19 vaccine or a previous dose of COVID-19 vaccine? No   Have you ever had an allergic reaction to another vaccine (other thanCOVID-19 vaccine) or an injectable medication? (This would include a severe allergic reaction or a reaction that caused hives, swelling, or respiratory distress, including wheezing.)   No    Do you have a history of any of the following:  Myocarditis or Pericarditis No  Dermal fillers:  No  Multisystem Inflammatory Syndrome (MIS-C or MIS-A)? No  COVID-19 disease within the past 3 months? No  Vaccinated with monkeypox vaccine in the last 4 weeks? No  Pt requesting Novavax vaccine per PCP's recommendation. Pt dx with congestive Heart Failure this year. Tolerated vaccine well today. Stayed for 15 min observation without problem. Updated [facility] copy given. Josie Saunders, RN

## 2021-12-14 ENCOUNTER — Encounter: Payer: Medicare Other | Attending: Internal Medicine | Admitting: *Deleted

## 2021-12-14 DIAGNOSIS — I5022 Chronic systolic (congestive) heart failure: Secondary | ICD-10-CM | POA: Diagnosis present

## 2021-12-14 DIAGNOSIS — Z5189 Encounter for other specified aftercare: Secondary | ICD-10-CM | POA: Diagnosis not present

## 2021-12-14 NOTE — Progress Notes (Signed)
Daily Session Note  Patient Details  Name: Glenda May MRN: [id number] Date of Birth: [birth year] Referring Provider:   Flowsheet Row Cardiac Rehab from 10/11/2021 in [facility] Cardiac and Pulmonary Rehab  Referring Provider Emily Filbert       Encounter Date: 12/14/2021  Check In:  Session Check In - 12/14/21 1130       Check-In   Supervising physician immediately available to respond to emergencies See telemetry face sheet for immediately available ER MD    Location [facility]-Cardiac & Pulmonary Rehab    Staff Present Renita Papa, RN BSN;Jessica Luan Pulling, MA, RCEP, CCRP, CCET;Noah Tickle, BS, Exercise Physiologist    Virtual Visit No    Medication changes reported     No    Fall or balance concerns reported    No    Warm-up and Cool-down Performed on first and last piece of equipment    Resistance Training Performed Yes    VAD Patient? No    PAD/SET Patient? No      Pain Assessment   Currently in Pain? No/denies                Social History   Tobacco Use  Smoking Status Never  Smokeless Tobacco Not on file    Goals Met:  Independence with exercise equipment Exercise tolerated well No report of concerns or symptoms today Strength training completed today  Goals Unmet:  Not Applicable  Comments: Pt able to follow exercise prescription today without complaint.  Will continue to monitor for progression.    Dr. Emily Filbert is Medical Director for [place].  Dr. Ottie Glazier is Medical Director for [facility] Pulmonary Rehabilitation.

## 2021-12-16 ENCOUNTER — Encounter: Payer: Medicare Other | Admitting: *Deleted

## 2021-12-16 DIAGNOSIS — I5022 Chronic systolic (congestive) heart failure: Secondary | ICD-10-CM | POA: Diagnosis not present

## 2021-12-16 NOTE — Progress Notes (Signed)
Daily Session Note  Patient Details  Name: Glenda May MRN: [id number] Date of Birth: 01/27/1945 Referring Provider:   Flowsheet Row Cardiac Rehab from 10/11/2021 in [facility] Cardiac and Pulmonary Rehab  Referring Provider Emily Filbert       Encounter Date: 12/16/2021  Check In:  Session Check In - 12/16/21 1039       Check-In   Supervising physician immediately available to respond to emergencies See telemetry face sheet for immediately available ER MD    Location [facility]-Cardiac & Pulmonary Rehab    Staff Present Renita Papa, RN BSN;Jessica Luan Pulling, MA, RCEP, CCRP, Bertram Gala, MS, ACSM CEP, Exercise Physiologist    Virtual Visit No    Medication changes reported     No    Fall or balance concerns reported    No    Warm-up and Cool-down Performed on first and last piece of equipment    Resistance Training Performed Yes    VAD Patient? No    PAD/SET Patient? No      Pain Assessment   Currently in Pain? No/denies                Social History   Tobacco Use  Smoking Status Never  Smokeless Tobacco Not on file    Goals Met:  Independence with exercise equipment Exercise tolerated well No report of concerns or symptoms today Strength training completed today  Goals Unmet:  Not Applicable  Comments: Pt able to follow exercise prescription today without complaint.  Will continue to monitor for progression.    Dr. Emily Filbert is Medical Director for [place].  Dr. Ottie Glazier is Medical Director for [facility] Pulmonary Rehabilitation.

## 2021-12-21 ENCOUNTER — Encounter: Payer: Medicare Other | Admitting: *Deleted

## 2021-12-21 DIAGNOSIS — I5022 Chronic systolic (congestive) heart failure: Secondary | ICD-10-CM | POA: Diagnosis not present

## 2021-12-21 NOTE — Progress Notes (Signed)
Daily Session Note  Patient Details  Name: Glenda May MRN: [id number] Date of Birth: 1945/11/23 Referring Provider:   Flowsheet Row Cardiac Rehab from 10/11/2021 in [facility] Cardiac and Pulmonary Rehab  Referring Provider Emily Filbert       Encounter Date: 12/21/2021  Check In:  Session Check In - 12/21/21 1100       Check-In   Supervising physician immediately available to respond to emergencies See telemetry face sheet for immediately available ER MD    Location [facility]-Cardiac & Pulmonary Rehab    Staff Present Renita Papa, RN BSN;Noah Tickle, BS, Exercise Physiologist;Jessica [place], MA, RCEP, CCRP, Bertram Gala, MS, ACSM CEP, Exercise Physiologist    Virtual Visit No    Medication changes reported     No    Fall or balance concerns reported    Yes    Comments slipped with wet shoes    Warm-up and Cool-down Performed on first and last piece of equipment    Resistance Training Performed Yes    VAD Patient? No    PAD/SET Patient? No      Pain Assessment   Currently in Pain? No/denies                Social History   Tobacco Use  Smoking Status Never  Smokeless Tobacco Not on file    Goals Met:  Independence with exercise equipment Exercise tolerated well No report of concerns or symptoms today Strength training completed today  Goals Unmet:  Not Applicable  Comments: Pt able to follow exercise prescription today without complaint.  Will continue to monitor for progression.    Dr. Emily Filbert is Medical Director for [place].  Dr. Ottie Glazier is Medical Director for [facility] Pulmonary Rehabilitation.

## 2021-12-23 ENCOUNTER — Ambulatory Visit: Payer: Medicare Other | Attending: Family | Admitting: Family

## 2021-12-23 ENCOUNTER — Encounter: Payer: Self-pay | Admitting: Family

## 2021-12-23 ENCOUNTER — Encounter: Payer: Medicare Other | Admitting: *Deleted

## 2021-12-23 VITALS — BP 132/58 | HR 85 | Resp 18 | Wt 123.5 lb

## 2021-12-23 DIAGNOSIS — I10 Essential (primary) hypertension: Secondary | ICD-10-CM | POA: Diagnosis not present

## 2021-12-23 DIAGNOSIS — I5022 Chronic systolic (congestive) heart failure: Secondary | ICD-10-CM

## 2021-12-23 DIAGNOSIS — R911 Solitary pulmonary nodule: Secondary | ICD-10-CM | POA: Diagnosis not present

## 2021-12-23 DIAGNOSIS — Z8249 Family history of ischemic heart disease and other diseases of the circulatory system: Secondary | ICD-10-CM | POA: Insufficient documentation

## 2021-12-23 DIAGNOSIS — I110 Hypertensive heart disease with heart failure: Secondary | ICD-10-CM | POA: Diagnosis not present

## 2021-12-23 DIAGNOSIS — R918 Other nonspecific abnormal finding of lung field: Secondary | ICD-10-CM | POA: Diagnosis not present

## 2021-12-23 DIAGNOSIS — G479 Sleep disorder, unspecified: Secondary | ICD-10-CM | POA: Insufficient documentation

## 2021-12-23 DIAGNOSIS — E785 Hyperlipidemia, unspecified: Secondary | ICD-10-CM | POA: Insufficient documentation

## 2021-12-23 NOTE — Progress Notes (Signed)
Patient ID: Glenda May, female    DOB: 12/02/1945, 76 y.o.   MRN: [id number]  HPI  Glenda May is a 76 y/o female with a history of hyperlipidemia, HTN, pulmonary nodules and chronic heart failure.    Echo report from 08/26/21 reviewed and showed an EF of 20-25% along with mildly elevated PA pressure and moderate MR.   RHC/LHC done 08/30/21 and showed: Mild, nonobstructive coronary artery disease with 10-20% proximal LAD and 20% ostial RCA stenoses.  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 Normal left heart, right heart, and pulmonary artery pressures. Normal Fick cardiac output/index.  Admitted 08/25/21 due to chest pain/tightness with worsening SOB. Initially given IV lasix with transition to oral diuretics. Cardiology consult obtained. Echo and cath done. Right-sided thoracentesis done with removal of 374m. Multiple pulmonary nodules as noted on CT scan. PT/OT evaluations done. Discharged after 6 days.   She presents today for a follow-up visit with a chief complaint of minimal fatigue with moderate exertion. She has chronic difficulty sleeping along with this. She denies any dizziness, abdominal distention, palpitations, pedal edema, chest pain, shortness of breath, cough or weight gain.   Hasn't had to take any PRN diuretic since she was last here.   She is participating in cardiac rehab. Scheduled for cardiac MRI January 2024.   Past Medical History:  Diagnosis Date   Allergic rhinitis    CHF (congestive heart failure) (HCC)    High cholesterol    Hypertension    Past Surgical History:  Procedure Laterality Date   APPENDECTOMY     CHOLECYSTECTOMY     LUMBAR LAMINECTOMY     L4-5   RIGHT/LEFT HEART CATH AND CORONARY ANGIOGRAPHY N/A 08/30/2021   Procedure: RIGHT/LEFT HEART CATH AND CORONARY ANGIOGRAPHY;  Surgeon: ENelva Bush MD;  Location: [facility];  Service: Cardiovascular;  Laterality: N/A;   TONSILLECTOMY     Family History  Problem  Relation Age of Onset   Stroke Mother        died @ 954  Dementia Mother    Stroke Father        died @ 642  Stroke Sister    Heart attack Brother        died @ 845  Breast cancer Neg Hx    Social History   Tobacco Use   Smoking status: Never   Smokeless tobacco: Not on file  Substance Use Topics   Alcohol use: Not Currently   Allergies  Allergen Reactions   Ace Inhibitors Other (See Comments)    Angioedema/ per patient.    Prior to Admission medications   Medication Sig Start Date End Date Taking? Authorizing Provider  albuterol (VENTOLIN HFA) 108 (90 Base) MCG/ACT inhaler Inhale 2 puffs into the lungs every 6 (six) hours as needed. 09/16/21 09/16/22 Yes [provider]  aspirin EC 81 MG tablet Take 1 tablet (81 mg total) by mouth daily. Swallow whole. 09/01/21  Yes PFritzi Mandes MD  Calcium Carbonate-Vit D-Min (CALTRATE 600+D PLUS MINERALS) 600-800 MG-UNIT TABS Take 1 tablet by mouth daily.   Yes [provider]  dapagliflozin propanediol (FARXIGA) 10 MG TABS tablet Take 1 tablet (10 mg total) by mouth daily. 09/01/21  Yes PFritzi Mandes MD  DEXILANT 60 MG capsule Take 1 capsule by mouth daily. 08/17/21  Yes [provider]  furosemide (LASIX) 40 MG tablet Take 0.5 tablets (20 mg total) by mouth daily. And extra '20mg'$  PRN for weight gain, swelling or SOB  11/24/21  Yes Thomasenia Dowse, Otila Kluver A, FNP  loratadine (CLARITIN) 10 MG tablet Take 10 mg by mouth daily.   Yes [provider]  Melatonin 5 MG CAPS Take 5 mg by mouth at bedtime.   Yes [provider]  metoprolol succinate (TOPROL-XL) 25 MG 24 hr tablet Take 1 tablet (25 mg total) by mouth daily. 10/18/21  Yes Holley Wirt, Aura Fey, FNP  Multiple Vitamins-Minerals (WOMENS MULTI GUMMIES PO) Take 1 each by mouth daily.   Yes [provider]  nitroGLYCERIN (NITROSTAT) 0.4 MG SL tablet Place 0.4 mg under the tongue every 5 (five) minutes as needed for chest pain.   Yes [provider]   rosuvastatin (CRESTOR) 20 MG tablet Take 20 mg by mouth daily. 09/30/21  Yes [provider]  spironolactone (ALDACTONE) 25 MG tablet Take 0.5 tablets (12.5 mg total) by mouth daily. 09/01/21  Yes Fritzi Mandes, MD    Review of Systems  Constitutional:  Positive for fatigue. Negative for appetite change.  HENT:  Negative for congestion, postnasal drip and sore throat.   Eyes: Negative.   Respiratory:  Negative for cough, chest tightness and shortness of breath.   Cardiovascular:  Negative for chest pain, palpitations and leg swelling.  Gastrointestinal:  Negative for abdominal distention, abdominal pain, nausea and vomiting.  Endocrine: Negative.   Genitourinary: Negative.   Musculoskeletal:  Negative for back pain and neck pain.  Skin: Negative.   Allergic/Immunologic: Negative.   Neurological:  Negative for dizziness and light-headedness.  Hematological:  Negative for adenopathy. Does not bruise/bleed easily.  Psychiatric/Behavioral:  Positive for sleep disturbance (chronic; sleeping on 2 pillows). Negative for dysphoric mood. The patient is not nervous/anxious.    Vitals:   12/23/21 0833  BP: (!) 132/58  Pulse: 85  Resp: 18  SpO2: 100%  Weight: 123 lb 8 oz (56 kg)   Wt Readings from Last 3 Encounters:  12/23/21 123 lb 8 oz (56 kg)  11/24/21 121 lb 8 oz (55.1 kg)  10/14/21 122 lb 8 oz (55.6 kg)   Lab Results  Component Value Date   CREATININE 0.90 11/24/2021   CREATININE 0.69 08/31/2021   CREATININE 1.06 (H) 08/30/2021   Physical Exam Vitals and nursing note reviewed.  Constitutional:      Appearance: Normal appearance.  HENT:     Head: Normocephalic and atraumatic.  Cardiovascular:     Rate and Rhythm: Normal rate and regular rhythm.  Pulmonary:     Effort: Pulmonary effort is normal. No respiratory distress.     Breath sounds: No wheezing or rales.  Abdominal:     General: There is no distension.     Palpations: Abdomen is soft.     Tenderness: There is  no abdominal tenderness.  Musculoskeletal:        General: No tenderness.     Cervical back: Normal range of motion and neck supple.     Right lower leg: No edema.     Left lower leg: No edema.  Skin:    General: Skin is warm and dry.  Neurological:     General: No focal deficit present.     Mental Status: She is alert and oriented to person, place, and time.  Psychiatric:        Mood and Affect: Mood normal.        Behavior: Behavior normal.        Thought Content: Thought content normal.   Assessment & Plan:  1: Chronic heart failure with reduced ejection  fraction- - NYHA class II - euvolemic today - weighing daily; reminded to call for an overnight weight gain of > 2 pounds or a weekly weight gain of > 5 pounds - weight up 2 pounds from last visit here 1 month ago - saw cardiology Sharlett Iles) 09/30/21; returns 02/07/22 - has cardiac MRI scheduled for 01/11/22 - not adding any salt to her food and has been reading food labels for sodium content - on GDMT of farxiga, metoprolol and spironolactone - had angioedema with ACE-i - hasn't had to take any additional diuretic since she was last here - participating in cardiac rehab - BNP 08/26/21 was 2082.8 - PharmD reconciled medications with the patient  2: HTN- - BP looks good (132/58) - saw PCP Sabra Heck) 09/16/21 - BMP 11/24/21 reviewed and showed sodium 144, potassium 4.8, creatinine 0.9 and GFR >60  3: Pulmonary nodules- - noted on CT - repeat CT in 6 months - saw pulmonology Lanney Gins) 09/16/21   Medication bottles reviewed.   Patient prefers to return PRN. Advised her to call us for any questions or if she needs to make another appointment and she was comfortable with this plan.

## 2021-12-23 NOTE — Progress Notes (Signed)
Daily Session Note  Patient Details  Name: Glenda May MRN: [id number] Date of Birth: 11-18-1945 Referring Provider:   Flowsheet Row Cardiac Rehab from 10/11/2021 in [facility] Cardiac and Pulmonary Rehab  Referring Provider Emily Filbert       Encounter Date: 12/23/2021  Check In:  Session Check In - 12/23/21 1112       Check-In   Supervising physician immediately available to respond to emergencies See telemetry face sheet for immediately available ER MD    Location [facility]-Cardiac & Pulmonary Rehab    Staff Present Darlyne Russian, RN, ADN;Meredith Sherryll Burger, RN BSN;Jessica Luan Pulling, MA, RCEP, CCRP, Bertram Gala, MS, ACSM CEP, Exercise Physiologist    Virtual Visit No    Medication changes reported     No    Fall or balance concerns reported    No    Warm-up and Cool-down Performed on first and last piece of equipment    Resistance Training Performed Yes    VAD Patient? No    PAD/SET Patient? No      Pain Assessment   Currently in Pain? No/denies                Social History   Tobacco Use  Smoking Status Never  Smokeless Tobacco Not on file    Goals Met:  Independence with exercise equipment Exercise tolerated well No report of concerns or symptoms today Strength training completed today  Goals Unmet:  Not Applicable  Comments: Pt able to follow exercise prescription today without complaint.  Will continue to monitor for progression.    Dr. Emily Filbert is Medical Director for [place].  Dr. Ottie Glazier is Medical Director for [facility] Pulmonary Rehabilitation.

## 2021-12-23 NOTE — Progress Notes (Signed)
[facility] - PHARMACIST COUNSELING NOTE  Guideline-Directed Medical Therapy/Evidence Based Medicine  ACE/ARB/ARNI:  None - allergy to ACEI (angioedema) Beta Blocker: Metoprolol succinate 25 mg daily Aldosterone Antagonist: Spironolactone 12.5 mg daily Diuretic: Furosemide 20 mg daily SGLT2i: Dapagliflozin 10 mg daily  Adherence Assessment  Do you ever forget to take your medication? '[]'$ Yes '[x]'$ No  Do you ever skip doses due to side effects? '[]'$ Yes '[x]'$ No  Do you have trouble affording your medicines? '[x]'$ Yes '[]'$ No  Are you ever unable to pick up your medication due to transportation difficulties? '[]'$ Yes '[x]'$ No  Do you ever stop taking your medications because you don't believe they are helping? '[]'$ Yes '[x]'$ No  Do you check your weight daily? '[x]'$ Yes '[]'$ No   Adherence strategy: Pill bottles. Patients states to not forget to take medications.   Barriers to obtaining medications: Cost of Wilder Glade was high. Cost the patient $300 due to donut hole in insurance coverage.   Vital signs: HR 85, BP 132/58, weight (pounds) 123 (baseline 120 lb) ECHO: Date 08/2021, EF 20-25, notes The left ventricle has severely decreased function. The left ventricle demonstrates global hypokinesis. The left ventricular internal cavity size  was moderately dilated. Left ventricular diastolic parameters are consistent with Grade II diastolic dysfunction (pseudonormalization). The average left ventricular global longitudinal strain is -5.1 %.      Latest Ref Rng & Units 11/24/2021    9:07 AM 08/31/2021    5:46 AM 08/30/2021    5:09 AM  BMP  Glucose 70 - 99 mg/dL 119  108  90   BUN 8 - 23 mg/dL 19  19  32   Creatinine 0.44 - 1.00 mg/dL 0.90  0.69  1.06   Sodium 135 - 145 mmol/L 144  133  134   Potassium 3.5 - 5.1 mmol/L 4.8  4.1  4.2   Chloride 98 - 111 mmol/L 105  100  94   CO2 22 - 32 mmol/L '30  25  28   '$ Calcium 8.9 - 10.3 mg/dL 10.0  8.7  8.8     Past Medical History:   Diagnosis Date   Allergic rhinitis    CHF (congestive heart failure) (HCC)    High cholesterol    Hypertension     ASSESSMENT 76 year old female who presents to the HF clinic for a follow up appointment. No complaints of side effects or issues taking medications. She checks her weight daily. Pt states blood pressure usually runs in the 110-120s. Today's blood pressure was high probably due to walking from the parking lot. Patient states she is doing a lot better with her salt intake.     PLAN CHF/HTN:  Continue current medications (lasix, farxiga, spironolactone and metoprolol succ). No medication adjustment needed at this time. Possibly can titrate up the spironolactone to target dose of 25 mg daily. Recommend at the next visit to check BMP and if stable to titrate MRA.   Time spent: 20 minutes  Oswald Hillock, Pharm.D. Clinical Pharmacist 12/23/2021 8:59 AM    Current Outpatient Medications:    albuterol (VENTOLIN HFA) 108 (90 Base) MCG/ACT inhaler, Inhale 2 puffs into the lungs every 6 (six) hours as needed., Disp: , Rfl:    aspirin EC 81 MG tablet, Take 1 tablet (81 mg total) by mouth daily. Swallow whole., Disp: 30 tablet, Rfl: 12   Calcium Carbonate-Vit D-Min (CALTRATE 600+D PLUS MINERALS) 600-800 MG-UNIT TABS, Take 1 tablet by mouth daily., Disp: , Rfl:  dapagliflozin propanediol (FARXIGA) 10 MG TABS tablet, Take 1 tablet (10 mg total) by mouth daily., Disp: 30 tablet, Rfl: 2   DEXILANT 60 MG capsule, Take 1 capsule by mouth daily., Disp: , Rfl:    furosemide (LASIX) 40 MG tablet, Take 0.5 tablets (20 mg total) by mouth daily. And extra '20mg'$  PRN for weight gain, swelling or SOB, Disp: 30 tablet, Rfl: 3   loratadine (CLARITIN) 10 MG tablet, Take 10 mg by mouth daily., Disp: , Rfl:    Melatonin 5 MG CAPS, Take 5 mg by mouth at bedtime., Disp: , Rfl:    metoprolol succinate (TOPROL-XL) 25 MG 24 hr tablet, Take 1 tablet (25 mg total) by mouth daily., Disp: 90 tablet, Rfl: 3    Multiple Vitamins-Minerals (WOMENS MULTI GUMMIES PO), Take 1 each by mouth daily., Disp: , Rfl:    nitroGLYCERIN (NITROSTAT) 0.4 MG SL tablet, Place 0.4 mg under the tongue every 5 (five) minutes as needed for chest pain., Disp: , Rfl:    rosuvastatin (CRESTOR) 20 MG tablet, Take 20 mg by mouth daily., Disp: , Rfl:    spironolactone (ALDACTONE) 25 MG tablet, Take 0.5 tablets (12.5 mg total) by mouth daily., Disp: 30 tablet, Rfl: 3   COUNSELING POINTS/CLINICAL PEARLS    DRUGS TO CAUTION IN HEART FAILURE  Drug or Class Mechanism  Analgesics NSAIDs COX-2 inhibitors Glucocorticoids  Sodium and water retention, increased systemic vascular resistance, decreased response to diuretics   Diabetes Medications Metformin Thiazolidinediones Rosiglitazone (Avandia) Pioglitazone (Actos) DPP4 Inhibitors Saxagliptin (Onglyza) Sitagliptin (Januvia)   Lactic acidosis Possible calcium channel blockade   Unknown  Antiarrhythmics Class I  Flecainide Disopyramide Class III Sotalol Other Dronedarone  Negative inotrope, proarrhythmic   Proarrhythmic, beta blockade  Negative inotrope  Antihypertensives Alpha Blockers Doxazosin Calcium Channel Blockers Diltiazem Verapamil Nifedipine Central Alpha Adrenergics Moxonidine Peripheral Vasodilators Minoxidil  Increases renin and aldosterone  Negative inotrope    Possible sympathetic withdrawal  Unknown  Anti-infective Itraconazole Amphotericin B  Negative inotrope Unknown  Hematologic Anagrelide Cilostazol   Possible inhibition of PD IV Inhibition of PD III causing arrhythmias  Neurologic/Psychiatric Stimulants Anti-Seizure Drugs Carbamazepine Pregabalin Antidepressants Tricyclics Citalopram Parkinsons Bromocriptine Pergolide Pramipexole Antipsychotics Clozapine Antimigraine Ergotamine Methysergide Appetite suppressants Bipolar Lithium  Peripheral alpha and beta agonist activity  Negative inotrope and  chronotrope Calcium channel blockade  Negative inotrope, proarrhythmic Dose-dependent QT prolongation  Excessive serotonin activity/valvular damage Excessive serotonin activity/valvular damage Unknown  IgE mediated hypersensitivy, calcium channel blockade  Excessive serotonin activity/valvular damage Excessive serotonin activity/valvular damage Valvular damage  Direct myofibrillar degeneration, adrenergic stimulation  Antimalarials Chloroquine Hydroxychloroquine Intracellular inhibition of lysosomal enzymes  Urologic Agents Alpha Blockers Doxazosin Prazosin Tamsulosin Terazosin  Increased renin and aldosterone  Adapted from Page Carleene Overlie, et al. "Drugs That May Cause or Exacerbate Heart Failure: A Scientific Statement from the American Heart  Association." Circulation 2016; 134:e32-e69. DOI: 10.1161/CIR.0000000000000426   MEDICATION ADHERENCES TIPS AND STRATEGIES Taking medication as prescribed improves patient outcomes in heart failure (reduces hospitalizations, improves symptoms, increases survival) Side effects of medications can be managed by decreasing doses, switching agents, stopping drugs, or adding additional therapy. Please let someone in the [facility] know if you have having bothersome side effects so we can modify your regimen. Do not alter your medication regimen without talking to [place].  Medication reminders can help patients remember to take drugs on time. If you are missing or forgetting doses you can try linking behaviors, using pill boxes, or an electronic reminder like an alarm on your  phone or an app. Some people can also get automated phone calls as medication reminders.

## 2021-12-23 NOTE — Patient Instructions (Addendum)
Continue weighing daily and call for an overnight weight gain of 3 pounds or more or a weekly weight gain of more than 5 pounds. ? ? ?If you have voicemail, please make sure your mailbox is cleaned out so that we may leave a message and please make sure to listen to any voicemails.  ? ? ?Call us in the future if you need us for anything ?

## 2021-12-28 ENCOUNTER — Encounter: Payer: Medicare Other | Admitting: *Deleted

## 2021-12-28 VITALS — Ht 62.75 in | Wt 123.2 lb

## 2021-12-28 DIAGNOSIS — I5022 Chronic systolic (congestive) heart failure: Secondary | ICD-10-CM | POA: Diagnosis not present

## 2021-12-28 NOTE — Patient Instructions (Signed)
Discharge Patient Instructions  Patient Details  Name: Glenda May MRN: [id number] Date of Birth: 08-28-1945 Referring Provider:  [name], MD   Number of Visits: 36  Reason for Discharge:  Patient reached a stable level of exercise. Patient independent in their exercise. Patient has met program and personal goals.  Smoking History:  Social History   Tobacco Use  Smoking Status Never  Smokeless Tobacco Not on file    Diagnosis:  Heart failure, chronic systolic (HCC)  Initial Exercise Prescription:  Initial Exercise Prescription - 10/11/21 1500       Date of Initial Exercise RX and Referring Provider   Date 10/11/21    Referring Provider Emily Filbert      Oxygen   Maintain Oxygen Saturation 88% or higher      Treadmill   MPH 2.3    Grade 0.5    Minutes 15    METs 2.92      Recumbant Bike   Level 2    RPM 50    Minutes 15    METs 2.98      NuStep   Level 2    SPM 80    Minutes 15    METs 2.92      REL-XR   Level 2    Speed 50    Minutes 15    METs 2.92      T5 Nustep   Level 1    SPM 80    Minutes 15    METs 2.92      Biostep-RELP   Level 2    SPM 50    Minutes 15    METs 2.92      Prescription Details   Frequency (times per week) 2    Duration Progress to 30 minutes of continuous aerobic without signs/symptoms of physical distress      Intensity   THRR 40-80% of Max Heartrate 107-130    Ratings of Perceived Exertion 11-13    Perceived Dyspnea 0-4      Progression   Progression Continue to progress workloads to maintain intensity without signs/symptoms of physical distress.      Resistance Training   Training Prescription Yes    Weight 3    Reps 10-15             Discharge Exercise Prescription (Final Exercise Prescription Changes):  Exercise Prescription Changes - 12/23/21 1400       Response to Exercise   Blood Pressure (Admit) 102/58    Blood Pressure (Exit) 100/60    Heart Rate (Admit) 86 bpm    Heart  Rate (Exercise) 128 bpm    Heart Rate (Exit) 92 bpm    Rating of Perceived Exertion (Exercise) 14    Symptoms none    Duration Continue with 30 min of aerobic exercise without signs/symptoms of physical distress.    Intensity THRR unchanged      Progression   Progression Continue to progress workloads to maintain intensity without signs/symptoms of physical distress.    Average METs 4.08      Resistance Training   Training Prescription Yes    Weight 3 lb    Reps 10-15      Interval Training   Interval Training No      Treadmill   MPH 3.5    Grade 1.5    Minutes 15    METs 4.4      Recumbant Bike   Level 6    Watts 36  Minutes 15    METs 4.02      REL-XR   Level 7    Minutes 15    METs 5.1      Biostep-RELP   Level 3    Minutes 15    METs 3      Track   Laps 44    Minutes 15    METs 3.39      Home Exercise Plan   Plans to continue exercise at Home (comment)   walking, staff videos   Frequency Add 3 additional days to program exercise sessions.    Initial Home Exercises Provided 10/28/21      Oxygen   Maintain Oxygen Saturation 88% or higher             Functional Capacity:  6 Minute Walk     Row Name 10/11/21 1515 12/28/21 1345       6 Minute Walk   Phase Initial Discharge    Distance 1360 feet 1605 feet    Distance % Change -- 18.01 %    Distance Feet Change -- 245 ft    Walk Time 6 minutes 6 minutes    # of Rest Breaks 0 0    MPH 2.58 3.03    METS 2.98 3.55    RPE 13 13    Perceived Dyspnea  1 0    VO2 Peak 10.44 12.45    Symptoms No No    Resting HR 83 bpm 88 bpm    Resting BP 124/62 116/62    Resting Oxygen Saturation  100 % 99 %    Exercise Oxygen Saturation  during 6 min walk 100 % 98 %    Max Ex. HR 106 bpm 122 bpm    Max Ex. BP 142/78 142/62    2 Minute Post BP 114/62 --               Nutrition & Weight - Outcomes:  Pre Biometrics - 10/11/21 1522       Pre Biometrics   Height 5' 2.75" (1.594 m)    Weight 121  lb 1.6 oz (54.9 kg)    Waist Circumference 30 inches    Hip Circumference 36.5 inches    Waist to Hip Ratio 0.82 %    BMI (Calculated) 21.62    Single Leg Stand 6.05 seconds             Post Biometrics - 12/28/21 1345        Post  Biometrics   Height 5' 2.75" (1.594 m)    Weight 123 lb 3.2 oz (55.9 kg)    Waist Circumference 28.5 inches    Hip Circumference 35.5 inches    Waist to Hip Ratio 0.8 %    BMI (Calculated) 21.99    Single Leg Stand 25.45 seconds             Nutrition:  Nutrition Therapy & Goals - 10/11/21 1525       Intervention Plan   Intervention Prescribe, educate and counsel regarding individualized specific dietary modifications aiming towards targeted core components such as weight, hypertension, lipid management, diabetes, heart failure and other comorbidities.    Expected Outcomes Short Term Goal: Understand basic principles of dietary content, such as calories, fat, sodium, cholesterol and nutrients.;Short Term Goal: A plan has been developed with personal nutrition goals set during dietitian appointment.;Long Term Goal: Adherence to prescribed nutrition plan.  Goals reviewed with patient; copy given to patient.

## 2021-12-28 NOTE — Progress Notes (Signed)
Daily Session Note  Patient Details  Name: Glenda May MRN: [id number] Date of Birth: Jul 20, 1945 Referring Provider:   Flowsheet Row Cardiac Rehab from 10/11/2021 in [facility] Cardiac [facility] Pulmonary Rehab  Referring Provider Emily Filbert       Encounter Date: 12/28/2021  Check In:  Session Check In - 12/28/21 1107       Check-In   Supervising physician immediately available to respond to emergencies See telemetry face sheet for immediately available ER MD    Location [facility]-Cardiac & Pulmonary Rehab    Staff Present Alberteen Sam, MA, [place], CCRP, Bertram Gala, MS, ACSM CEP, Exercise Physiologist;Jaiah Weigel Sherryll Burger, RN BSN    Virtual Visit No    Medication changes reported     No    Fall or balance concerns reported    No    Warm-up and Cool-down Performed on first and last piece of equipment    Resistance Training Performed Yes    VAD Patient? No    PAD/SET Patient? No      Pain Assessment   Currently in Pain? No/denies                Social History   Tobacco Use  Smoking Status Never  Smokeless Tobacco Not on file    Goals Met:  Independence with exercise equipment Exercise tolerated well No report of concerns or symptoms today Strength training completed today  Goals Unmet:  Not Applicable  Comments: Pt able to follow exercise prescription today without complaint.  Will continue to monitor for progression.    Dr. Emily Filbert is Medical Director for [place].  Dr. Ottie Glazier is Medical Director for [facility] Pulmonary Rehabilitation.

## 2021-12-30 ENCOUNTER — Encounter: Payer: Medicare Other | Admitting: *Deleted

## 2021-12-30 DIAGNOSIS — I5022 Chronic systolic (congestive) heart failure: Secondary | ICD-10-CM

## 2021-12-30 NOTE — Progress Notes (Signed)
Daily Session Note  Patient Details  Name: Glenda May MRN: [id number] Date of Birth: 12-16-1945 Referring Provider:   Flowsheet Row Cardiac Rehab from 10/11/2021 in [facility] Cardiac and Pulmonary Rehab  Referring Provider Emily Filbert       Encounter Date: 12/30/2021  Check In:  Session Check In - 12/30/21 1121       Check-In   Supervising physician immediately available to respond to emergencies See telemetry face sheet for immediately available ER MD    Location [facility]-Cardiac & Pulmonary Rehab    Staff Present Renita Papa, RN BSN;Joseph Tessie Fass, RCP,RRT,BSRT;Noah Bancroft, [place], Exercise Physiologist    Virtual Visit No    Medication changes reported     No    Fall or balance concerns reported    No    Warm-up and Cool-down Performed on first and last piece of equipment    Resistance Training Performed Yes    VAD Patient? No    PAD/SET Patient? No      Pain Assessment   Currently in Pain? No/denies                Social History   Tobacco Use  Smoking Status Never  Smokeless Tobacco Not on file    Goals Met:  Independence with exercise equipment Exercise tolerated well No report of concerns or symptoms today Strength training completed today  Goals Unmet:  Not Applicable  Comments: Pt able to follow exercise prescription today without complaint.  Will continue to monitor for progression.    Dr. Emily Filbert is Medical Director for [place].  Dr. Ottie Glazier is Medical Director for [facility] Pulmonary Rehabilitation.

## 2022-01-04 ENCOUNTER — Encounter: Payer: Medicare Other | Admitting: *Deleted

## 2022-01-04 DIAGNOSIS — I5022 Chronic systolic (congestive) heart failure: Secondary | ICD-10-CM | POA: Diagnosis not present

## 2022-01-04 NOTE — Progress Notes (Signed)
Daily Session Note  Patient Details  Name: Glenda May MRN: [id number] Date of Birth: Dec 26, 1945 Referring Provider:   Flowsheet Row Cardiac Rehab from 10/11/2021 in [facility] Cardiac and Pulmonary Rehab  Referring Provider Emily Filbert       Encounter Date: 01/04/2022  Check In:  Session Check In - 01/04/22 1106       Check-In   Supervising physician immediately available to respond to emergencies See telemetry face sheet for immediately available ER MD    Location [facility]-Cardiac & Pulmonary Rehab    Staff Present Renita Papa, RN BSN;Noah Tickle, BS, Exercise Physiologist;Jessica [place], MA, RCEP, CCRP, CCET    Virtual Visit No    Medication changes reported     No    Fall or balance concerns reported    No    Warm-up and Cool-down Performed on first and last piece of equipment    Resistance Training Performed Yes    VAD Patient? No    PAD/SET Patient? No      Pain Assessment   Currently in Pain? No/denies                Social History   Tobacco Use  Smoking Status Never  Smokeless Tobacco Not on file    Goals Met:  Independence with exercise equipment Exercise tolerated well No report of concerns or symptoms today Strength training completed today  Goals Unmet:  Not Applicable  Comments: Pt able to follow exercise prescription today without complaint.  Will continue to monitor for progression.    Dr. Emily Filbert is Medical Director for [place].  Dr. Ottie Glazier is Medical Director for [facility] Pulmonary Rehabilitation.

## 2022-01-05 ENCOUNTER — Encounter: Payer: Self-pay | Admitting: *Deleted

## 2022-01-05 DIAGNOSIS — I5022 Chronic systolic (congestive) heart failure: Secondary | ICD-10-CM

## 2022-01-05 NOTE — Progress Notes (Signed)
Cardiac Individual Treatment Plan  Patient Details  Name: Glenda May MRN: [id number] Date of Birth: 1945/02/06 Referring Provider:   Flowsheet Row Cardiac Rehab from 10/11/2021 in [facility] Cardiac and Pulmonary Rehab  Referring Provider Emily Filbert       Initial Encounter Date:  Flowsheet Row Cardiac Rehab from 10/11/2021 in [facility] Cardiac and Pulmonary Rehab  Date 10/11/21       Visit Diagnosis: Heart failure, chronic systolic ([place])  Patient's Home Medications on Admission:  Current Outpatient Medications:    albuterol (VENTOLIN HFA) 108 (90 Base) MCG/ACT inhaler, Inhale 2 puffs into the lungs every 6 (six) hours as needed., Disp: , Rfl:    aspirin EC 81 MG tablet, Take 1 tablet (81 mg total) by mouth daily. Swallow whole., Disp: 30 tablet, Rfl: 12   Calcium Carbonate-Vit D-Min (CALTRATE 600+D PLUS MINERALS) 600-800 MG-UNIT TABS, Take 1 tablet by mouth daily., Disp: , Rfl:    dapagliflozin propanediol (FARXIGA) 10 MG TABS tablet, Take 1 tablet (10 mg total) by mouth daily., Disp: 30 tablet, Rfl: 2   DEXILANT 60 MG capsule, Take 1 capsule by mouth daily., Disp: , Rfl:    furosemide (LASIX) 40 MG tablet, Take 0.5 tablets (20 mg total) by mouth daily. And extra 55m PRN for weight gain, swelling or SOB, Disp: 30 tablet, Rfl: 3   loratadine (CLARITIN) 10 MG tablet, Take 10 mg by mouth daily., Disp: , Rfl:    Melatonin 5 MG CAPS, Take 5 mg by mouth at bedtime., Disp: , Rfl:    metoprolol succinate (TOPROL-XL) 25 MG 24 hr tablet, Take 1 tablet (25 mg total) by mouth daily., Disp: 90 tablet, Rfl: 3   Multiple Vitamins-Minerals (WOMENS MULTI GUMMIES PO), Take 1 each by mouth daily., Disp: , Rfl:    nitroGLYCERIN (NITROSTAT) 0.4 MG SL tablet, Place 0.4 mg under the tongue every 5 (five) minutes as needed for chest pain., Disp: , Rfl:    rosuvastatin (CRESTOR) 20 MG tablet, Take 20 mg by mouth daily., Disp: , Rfl:    spironolactone (ALDACTONE) 25 MG tablet, Take 0.5 tablets (12.5 mg total)  by mouth daily., Disp: 30 tablet, Rfl: 3  Past Medical History: Past Medical History:  Diagnosis Date   Allergic rhinitis    CHF (congestive heart failure) (HCC)    High cholesterol    Hypertension     Tobacco Use: Social History   Tobacco Use  Smoking Status Never  Smokeless Tobacco Not on file    Labs: Review Flowsheet       Latest Ref Rng & Units 08/28/2021  Labs for ITP Cardiac and Pulmonary Rehab  Cholestrol 0 - 200 mg/dL 183   LDL (calc) 0 - 99 mg/dL 97   HDL-C >40 mg/dL 56   Trlycerides <150 mg/dL 151   Hemoglobin A1c 4.8 - 5.6 % 5.9      Exercise Target Goals: Exercise Program Goal: Individual exercise prescription set using results from initial 6 min walk test and THRR while considering  patient's activity barriers and safety.   Exercise Prescription Goal: Initial exercise prescription builds to 30-45 minutes a day of aerobic activity, 2-3 days per week.  Home exercise guidelines will be given to patient during program as part of exercise prescription that the participant will acknowledge.   Education: Aerobic Exercise: - Group verbal and visual presentation on the components of exercise prescription. Introduces F.I.T.T principle from ACSM for exercise prescriptions.  Reviews F.I.T.T. principles of aerobic exercise including progression. Written material given at  graduation. Flowsheet Row Cardiac Rehab from 12/30/2021 in [facility] Cardiac and Pulmonary Rehab  Education need identified 10/11/21       Education: Resistance Exercise: - Group verbal and visual presentation on the components of exercise prescription. Introduces F.I.T.T principle from ACSM for exercise prescriptions  Reviews F.I.T.T. principles of resistance exercise including progression. Written material given at graduation.    Education: Exercise & Equipment Safety: - Individual verbal instruction and demonstration of equipment use and safety with use of the equipment. Flowsheet Row Cardiac  Rehab from 12/30/2021 in [facility] Cardiac and Pulmonary Rehab  Date 10/11/21  Educator [facility]  Instruction Review Code 1- Verbalizes Understanding       Education: Exercise Physiology & General Exercise Guidelines: - Group verbal and written instruction with models to review the exercise physiology of the cardiovascular system and associated critical values. Provides general exercise guidelines with specific guidelines to those with heart or lung disease.  Flowsheet Row Cardiac Rehab from 12/30/2021 in [facility] Cardiac and Pulmonary Rehab  Date 11/11/21  Educator [facility]  Instruction Review Code 1- Verbalizes Understanding       Education: Flexibility, Balance, Mind/Body Relaxation: - Group verbal and visual presentation with interactive activity on the components of exercise prescription. Introduces F.I.T.T principle from ACSM for exercise prescriptions. Reviews F.I.T.T. principles of flexibility and balance exercise training including progression. Also discusses the mind body connection.  Reviews various relaxation techniques to help reduce and manage stress (i.e. Deep breathing, progressive muscle relaxation, and visualization). Balance handout provided to take home. Written material given at graduation.   Activity Barriers & Risk Stratification:  Activity Barriers & Cardiac Risk Stratification - 09/29/21 1534       Activity Barriers & Cardiac Risk Stratification   Activity Barriers Back Problems    Cardiac Risk Stratification High             6 Minute Walk:  6 Minute Walk     Row Name 10/11/21 1515 12/28/21 1345       6 Minute Walk   Phase Initial Discharge    Distance 1360 feet 1605 feet    Distance % Change -- 18.01 %    Distance Feet Change -- 245 ft    Walk Time 6 minutes 6 minutes    # of Rest Breaks 0 0    MPH 2.58 3.03    METS 2.98 3.55    RPE 13 13    Perceived Dyspnea  1 0    VO2 Peak 10.44 12.45    Symptoms No No    Resting HR 83 bpm 88 bpm    Resting BP 124/62  116/62    Resting Oxygen Saturation  100 % 99 %    Exercise Oxygen Saturation  during 6 min walk 100 % 98 %    Max Ex. HR 106 bpm 122 bpm    Max Ex. BP 142/78 142/62    2 Minute Post BP 114/62 --             Oxygen Initial Assessment:   Oxygen Re-Evaluation:   Oxygen Discharge (Final Oxygen Re-Evaluation):   Initial Exercise Prescription:  Initial Exercise Prescription - 10/11/21 1500       Date of Initial Exercise RX and Referring Provider   Date 10/11/21    Referring Provider Emily Filbert      Oxygen   Maintain Oxygen Saturation 88% or higher      Treadmill   MPH 2.3    Grade 0.5    Minutes  15    METs 2.92      Recumbant Bike   Level 2    RPM 50    Minutes 15    METs 2.98      NuStep   Level 2    SPM 80    Minutes 15    METs 2.92      REL-XR   Level 2    Speed 50    Minutes 15    METs 2.92      T5 Nustep   Level 1    SPM 80    Minutes 15    METs 2.92      Biostep-RELP   Level 2    SPM 50    Minutes 15    METs 2.92      Prescription Details   Frequency (times per week) 2    Duration Progress to 30 minutes of continuous aerobic without signs/symptoms of physical distress      Intensity   THRR 40-80% of Max Heartrate 107-130    Ratings of Perceived Exertion 11-13    Perceived Dyspnea 0-4      Progression   Progression Continue to progress workloads to maintain intensity without signs/symptoms of physical distress.      Resistance Training   Training Prescription Yes    Weight 3    Reps 10-15             Perform Capillary Blood Glucose checks as needed.  Exercise Prescription Changes:   Exercise Prescription Changes     Row Name 10/11/21 1500 10/28/21 1100 10/28/21 1600 11/09/21 1500 11/24/21 1300     Response to Exercise   Blood Pressure (Admit) 124/62 -- 112/62 118/62 134/76   Blood Pressure (Exercise) 142/78 -- 140/66 132/58 --   Blood Pressure (Exit) 114/62 -- 104/62 122/62 124/78   Heart Rate (Admit) 83 bpm --  82 bpm 81 bpm 78 bpm   Heart Rate (Exercise) 106 bpm -- 125 bpm 127 bpm 124 bpm   Heart Rate (Exit) 84 bpm -- 90 bpm 97 bpm 85 bpm   Oxygen Saturation (Admit) 100 % -- -- -- --   Oxygen Saturation (Exercise) 100 % -- -- -- --   Oxygen Saturation (Exit) 100 % -- -- -- --   Rating of Perceived Exertion (Exercise) 13 -- _0 Perceived Dyspnea (Exercise) 1 -- 0 -- --   Symptoms none -- none none none   Comments 6 MWT results -- First 3 days of rehab -- --   Duration -- -- Continue with 30 min of aerobic exercise without signs/symptoms of physical distress. Continue with 30 min of aerobic exercise without signs/symptoms of physical distress. Continue with 30 min of aerobic exercise without signs/symptoms of physical distress.   Intensity -- -- THRR unchanged THRR unchanged THRR unchanged     Progression   Progression -- -- Continue to progress workloads to maintain intensity without signs/symptoms of physical distress. Continue to progress workloads to maintain intensity without signs/symptoms of physical distress. Continue to progress workloads to maintain intensity without signs/symptoms of physical distress.   Average METs -- -- 2.74 3.83 3.19     Resistance Training   Training Prescription -- -- Yes Yes Yes   Weight -- -- 3 lb 3 lb 3 lb   Reps -- -- 10-15 10-15 10-15     Interval Training   Interval Training -- -- No No No     Treadmill   MPH -- --  2.3 3 2.5   Grade -- -- _0 Minutes -- -- _1 METs -- -- 3.08 3.71 3.6     Recumbant Bike   Level -- -- -- 4 3   Minutes -- -- -- 15 15   METs -- -- -- 4.03 4.03     NuStep   Level -- -- -- 4 3   Minutes -- -- -- 15 15   METs -- -- -- 2.7 2.9     REL-XR   Level -- -- 2 5 --   Minutes -- -- 15 15 --   METs -- -- 2.9 5.7 --     T5 Nustep   Level -- -- 1 -- --   Minutes -- -- 15 -- --   METs -- -- 2.1 -- --     Biostep-RELP   Level -- -- _2 Minutes -- -- _3 METs -- -- _4 Track   Laps  -- -- 32 30 37   Minutes -- -- _5 METs -- -- 2.63 2.63 3.28     Home Exercise Plan   Plans to continue exercise at -- Home (comment)  walking, staff videos Home (comment)  walking, staff videos Home (comment)  walking, staff videos Home (comment)  walking, staff videos   Frequency -- Add 3 additional days to program exercise sessions. Add 3 additional days to program exercise sessions. Add 3 additional days to program exercise sessions. Add 3 additional days to program exercise sessions.   Initial Home Exercises Provided -- 10/28/21 10/28/21 10/28/21 10/28/21     Oxygen   Maintain Oxygen Saturation -- -- 88% or higher 88% or higher 88% or higher    Row Name 12/07/21 1500 12/23/21 1400           Response to Exercise   Blood Pressure (Admit) 100/60 102/58      Blood Pressure (Exit) 124/60 100/60      Heart Rate (Admit) 81 bpm 86 bpm      Heart Rate (Exercise) 121 bpm 128 bpm      Heart Rate (Exit) 94 bpm 92 bpm      Rating of Perceived Exertion (Exercise) 15 14      Symptoms none none      Duration Continue with 30 min of aerobic exercise without signs/symptoms of physical distress. Continue with 30 min of aerobic exercise without signs/symptoms of physical distress.      Intensity THRR unchanged THRR unchanged        Progression   Progression Continue to progress workloads to maintain intensity without signs/symptoms of physical distress. Continue to progress workloads to maintain intensity without signs/symptoms of physical distress.      Average METs 4.34 4.08        Resistance Training   Training Prescription Yes Yes      Weight 3 lb 3 lb      Reps 10-15 10-15        Interval Training   Interval Training No No        Treadmill   MPH 3.2 3.5      Grade 5 1.5      Minutes 15 15      METs 5.66 4.4        Recumbant Bike   Level 5 6      Watts 34 36  Minutes 15 15      METs 4.04 4.02        REL-XR   Level 5 7      Minutes 15 15      METs 5.3 5.1         Biostep-RELP   Level -- 3      Minutes -- 15      METs -- 3        Track   Laps 40 44      Minutes 15 15      METs 3.18 3.39        Home Exercise Plan   Plans to continue exercise at Home (comment)  walking, staff videos Home (comment)  walking, staff videos      Frequency Add 3 additional days to program exercise sessions. Add 3 additional days to program exercise sessions.      Initial Home Exercises Provided 10/28/21 10/28/21        Oxygen   Maintain Oxygen Saturation 88% or higher 88% or higher               Exercise Comments:   Exercise Comments     Row Name 10/12/21 1117           Exercise Comments First full day of exercise!  Patient was oriented to gym and equipment including functions, settings, policies, and procedures.  Patient's individual exercise prescription and treatment plan were reviewed.  All starting workloads were established based on the results of the 6 minute walk test done at initial orientation visit.  The plan for exercise progression was also introduced and progression will be customized based on patient's performance and goals                Exercise Goals and Review:   Exercise Goals     Row Name 10/11/21 1522             Exercise Goals   Increase Physical Activity Yes       Intervention Provide advice, education, support and counseling about physical activity/exercise needs.       Expected Outcomes Short Term: Attend rehab on a regular basis to increase amount of physical activity.;Long Term: Add in home exercise to make exercise part of routine and to increase amount of physical activity.;Long Term: Exercising regularly at least 3-5 days a week.       Increase Strength and Stamina Yes       Intervention Develop an individualized exercise prescription for aerobic and resistive training based on initial evaluation findings, risk stratification, comorbidities and participant's personal goals.;Provide advice, education, support  and counseling about physical activity/exercise needs.       Expected Outcomes Short Term: Increase workloads from initial exercise prescription for resistance, speed, and METs.;Short Term: Perform resistance training exercises routinely during rehab and add in resistance training at home;Long Term: Improve cardiorespiratory fitness, muscular endurance and strength as measured by increased METs and functional capacity (6MWT)       Able to understand and use rate of perceived exertion (RPE) scale Yes       Intervention Provide education and explanation on how to use RPE scale       Expected Outcomes Short Term: Able to use RPE daily in rehab to express subjective intensity level;Long Term:  Able to use RPE to guide intensity level when exercising independently       Able to understand and use Dyspnea scale Yes  Intervention Provide education and explanation on how to use Dyspnea scale       Expected Outcomes Short Term: Able to use Dyspnea scale daily in rehab to express subjective sense of shortness of breath during exertion;Long Term: Able to use Dyspnea scale to guide intensity level when exercising independently       Knowledge and understanding of Target Heart Rate Range (THRR) Yes       Intervention Provide education and explanation of THRR including how the numbers were predicted and where they are located for reference       Expected Outcomes Short Term: Able to state/look up THRR;Short Term: Able to use daily as guideline for intensity in rehab;Long Term: Able to use THRR to govern intensity when exercising independently       Able to check pulse independently Yes       Intervention Provide education and demonstration on how to check pulse in carotid and radial arteries.;Review the importance of being able to check your own pulse for safety during independent exercise       Expected Outcomes Short Term: Able to explain why pulse checking is important during independent exercise;Long Term:  Able to check pulse independently and accurately       Understanding of Exercise Prescription Yes       Intervention Provide education, explanation, and written materials on patient's individual exercise prescription       Expected Outcomes Short Term: Able to explain program exercise prescription;Long Term: Able to explain home exercise prescription to exercise independently                Exercise Goals Re-Evaluation :  Exercise Goals Re-Evaluation     Row Name 10/12/21 1117 10/28/21 1108 10/28/21 1607 11/09/21 1601 11/18/21 1416     Exercise Goal Re-Evaluation   Exercise Goals Review Increase Physical Activity;Able to understand and use rate of perceived exertion (RPE) scale;Knowledge and understanding of Target Heart Rate Range (THRR);Understanding of Exercise Prescription;Increase Strength and Stamina;Able to check pulse independently Increase Physical Activity;Able to understand and use rate of perceived exertion (RPE) scale;Knowledge and understanding of Target Heart Rate Range (THRR);Understanding of Exercise Prescription;Increase Strength and Stamina;Able to check pulse independently;Able to understand and use Dyspnea scale Increase Physical Activity;Understanding of Exercise Prescription;Increase Strength and Stamina Increase Physical Activity;Understanding of Exercise Prescription;Increase Strength and Stamina Increase Physical Activity;Understanding of Exercise Prescription;Increase Strength and Stamina   Comments Reviewed RPE and dyspnea scales, THR and program prescription with pt today.  Pt voiced understanding and was given a copy of goals to take home. Blakleigh is doing well in rehab.  She has noted that her stamina is getting better.  She is already walking on her off days.  Reviewed home exercise with pt today.  Pt plans to continues walking at home/park for exercise.  We also talked about using our staff videos as well.  Reviewed THR, pulse, RPE, sign and symptoms, pulse  oximetery and when to call 911 or MD.  Also discussed weather considerations and indoor options.  Pt voiced understanding. Shamecka is doing well in rehab. She had an overall average MET level of 2.74 METs. She got up to 32 laps on the track and improved to level 3 on the biostep. We will continue to monitor her progress in the program. Leeah continues to do well in rehab. she has significantly increased most of her workloads; specifically, she went to level 4 on both the T4 Nustep and recumbent bike. She also increased to level on  the XR working up to almost 6 METS! She would benefit from increasing her handweights to 4 lbs. Will continue to monitor. Ruther is exercising at home and walking on her off days from rehab. She walks on the track for about 30 minutes at one time. 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  We reviewed her THR and encouraged to check her HR while exercising. She is encouraged to buy a pulse ox. She is also starting to do bodyweight strength exercises to incorporate her resistance training.   Expected Outcomes Short: Use RPE daily to regulate intensity. Long: Follow program prescription in THR. Short: Conitnue to walk on off days Long: Continue to improve stamina Short: Continue to attend cardiac rehab and follow current exercise prescription. Long: Continue to increase strength and stamina. Short: Increase resistance training to 4 lbs Long: Continue to increase overall MET level Short: Buy pulse ox, start checking HR Long: continue to exercise independently    Row Name 11/24/21 1347 12/07/21 1546 12/23/21 1109 12/23/21 1452       Exercise Goal Re-Evaluation   Exercise Goals Review Increase Physical Activity;Understanding of Exercise Prescription;Increase Strength and Stamina Increase Physical Activity;Understanding of Exercise Prescription;Increase Strength and Stamina Increase Physical Activity;Understanding of  Exercise Prescription;Increase Strength and Stamina Increase Physical Activity;Understanding of Exercise Prescription;Increase Strength and Stamina    Comments Jeena is doing well in rehab. She has consistently walked at an overall average MET level above 3 METs. She also increased her incline on the treadmill to 2% while maintaining a speed of 2.5 mph. She walked up to 37 laps on the track as well. We will continue to monitor her progress in the program. Chrysten continues to do well in rehab. She significantly increased her treadmill workload to a 3.2 mph speed/5 % incline, working over 5 METS. She also hit  a max of 40 laps on the track. She worked on the recumbent bike after a while and was able to work at 34 watts. She would benefit from increasing to 4 lbs for handweights. Will continue to monitor. Oluwatobi is doing well in rehab.  She has not been walking as much with the cooler weather.  She is getting a treadmill for Christmas to be able to keep going at home. She has noticed that her stamina is getting better and lasting longer throughout the day.  She has been able to start going back to helping her sister. Maragaret continues to do well in rehab. She recently improved to level 6 on the recumbent bike and level 7 on the XR. She has tolerated 3 lb weights for resistance training and may benefit from trying 4 lb. She was able to walk up to 44 laps on the track as well. She is also due for her post 6MWT soon and will look to improve on that. We will continue to monitor her progress.    Expected Outcomes Short: Continue to increase workloads as tolerated. Long: Continue to increase strength and stamina. Short: Increase to 4 lb handweights Long: Continue to increase overall MET level Short: Enjoy new treadmill Long: Continue to improve stamina Short: Continue to increase workloads and try 4 lb handweights. Long: Continue to improve strength and stamina.             Discharge Exercise Prescription  (Final Exercise Prescription Changes):  Exercise Prescription Changes - 12/23/21 1400       Response to Exercise  Blood Pressure (Admit) 102/58    Blood Pressure (Exit) 100/60    Heart Rate (Admit) 86 bpm    Heart Rate (Exercise) 128 bpm    Heart Rate (Exit) 92 bpm    Rating of Perceived Exertion (Exercise) 14    Symptoms none    Duration Continue with 30 min of aerobic exercise without signs/symptoms of physical distress.    Intensity THRR unchanged      Progression   Progression Continue to progress workloads to maintain intensity without signs/symptoms of physical distress.    Average METs 4.08      Resistance Training   Training Prescription Yes    Weight 3 lb    Reps 10-15      Interval Training   Interval Training No      Treadmill   MPH 3.5    Grade 1.5    Minutes 15    METs 4.4      Recumbant Bike   Level 6    Watts 36    Minutes 15    METs 4.02      REL-XR   Level 7    Minutes 15    METs 5.1      Biostep-RELP   Level 3    Minutes 15    METs 3      Track   Laps 44    Minutes 15    METs 3.39      Home Exercise Plan   Plans to continue exercise at Home (comment)   walking, staff videos   Frequency Add 3 additional days to program exercise sessions.    Initial Home Exercises Provided 10/28/21      Oxygen   Maintain Oxygen Saturation 88% or higher             Nutrition:  Target Goals: Understanding of nutrition guidelines, daily intake of sodium <1528m, cholesterol <2023m calories 30% from fat and 7% or less from saturated fats, daily to have 5 or more servings of fruits and vegetables.  Education: All About Nutrition: -Group instruction provided by verbal, written material, interactive activities, discussions, models, and posters to present general guidelines for heart healthy nutrition including fat, fiber, MyPlate, the role of sodium in heart healthy nutrition, utilization of the nutrition label, and utilization of this knowledge for  meal planning. Follow up email sent as well. Written material given at graduation. [facility]  Education need identified 10/11/21  Date 11/25/21  Educator MCSan LorenzoInstruction Review Code 1- Verbalizes Understanding       Biometrics:  Pre Biometrics - 10/11/21 1522       Pre Biometrics   Height 5' 2.75" (1.594 m)    Weight 121 lb 1.6 oz (54.9 kg)    Waist Circumference 30 inches    Hip Circumference 36.5 inches    Waist to Hip Ratio 0.82 %    BMI (Calculated) 21.62    Single Leg Stand 6.05 seconds             Post Biometrics - 12/28/21 1345        Post  Biometrics   Height 5' 2.75" (1.594 m)    Weight 123 lb 3.2 oz (55.9 kg)    Waist Circumference 28.5 inches    Hip Circumference 35.5 inches    Waist to Hip Ratio 0.8 %    BMI (Calculated) 21.99    Single Leg Stand 25.45 seconds  Nutrition Therapy Plan and Nutrition Goals:  Nutrition Therapy & Goals - 10/11/21 1525       Intervention Plan   Intervention Prescribe, educate and counsel regarding individualized specific dietary modifications aiming towards targeted core components such as weight, hypertension, lipid management, diabetes, heart failure and other comorbidities.    Expected Outcomes Short Term Goal: Understand basic principles of dietary content, such as calories, fat, sodium, cholesterol and nutrients.;Short Term Goal: A plan has been developed with personal nutrition goals set during dietitian appointment.;Long Term Goal: Adherence to prescribed nutrition plan.             Nutrition Assessments:  MEDIFICTS Score Key: ?70 Need to make dietary changes  40-70 Heart Healthy Diet ? 40 Therapeutic Level Cholesterol Diet  Flowsheet Row Cardiac Rehab from 10/11/2021 in [facility] Cardiac and Pulmonary Rehab  Picture Your Plate Total Score on Admission 85      Picture Your Plate Scores: <36 Unhealthy dietary pattern with  much room for improvement. 41-50 Dietary pattern unlikely to meet recommendations for good health and room for improvement. 51-60 More healthful dietary pattern, with some room for improvement.  >60 Healthy dietary pattern, although there may be some specific behaviors that could be improved.    Nutrition Goals Re-Evaluation:  Nutrition Goals Re-Evaluation     [place] Name 10/28/21 1124 11/18/21 1423 12/23/21 1115         Goals   Nutrition Goal ST: add protein and fat to snack - almonds with fruit, add additional snack mid-afternoon - greek yogurt with fruit. LT: Gain 10 pounds, continue with heart healthy changes, meet energy and protein needs ST: add protein and fat to snack - almonds with fruit, add additional snack mid-afternoon - greek yogurt with fruit. LT: Gain 10 pounds, continue with heart healthy changes, meet energy and protein needs ST: add protein and fat to snack - almonds with fruit, add additional snack mid-afternoon - greek yogurt with fruit. LT: Gain 10 pounds, continue with heart healthy changes, meet energy and protein needs     Comment Savi is doing well with her diet.  She is using her air fryer, grill, and baking.  She is eating chicken and fish.  She is doiing better with staying away from fried foods.  She has been watching her salt.  She is working to increase her protein intake.  She is still trying to gain weight. Bettylee has been trying to add more protein in her diet. She has incorporated more peanut butter, nuts, greek yogurt, and fish into her diet. She is eating a rainbow of fruits which she loves. She has cut out a lot of sodium and fried foods. She is trying to gain some weight back but also trying to eat the appropriate things that are heart healthy. Kala is doing well with her diet. She is staying away from her salt.  She has switched to soy milk. She is also staying away from fired foods.  She surprisingly likes eating fish and using her crockpot during the  week.  She continues to work on variety in her fruits and vegetables.     Expected Outcome Short: Continue to add in more protein Long: Continue to focus on heart healthy diet Short: Continue to follow RD recommendations Long: Continue to eat heart healthy, work toward 10 lb weight gain Short; Continue to use crockpot  Long; Contiue to follow heart healhty guidelines              Nutrition Goals  Discharge (Final Nutrition Goals Re-Evaluation):  Nutrition Goals Re-Evaluation - 12/23/21 1115       Goals   Nutrition Goal ST: add protein and fat to snack - almonds with fruit, add additional snack mid-afternoon - greek yogurt with fruit. LT: Gain 10 pounds, continue with heart healthy changes, meet energy and protein needs    Comment Michaeleen is doing well with her diet. She is staying away from her salt.  She has switched to soy milk. She is also staying away from fired foods.  She surprisingly likes eating fish and using her crockpot during the week.  She continues to work on variety in her fruits and vegetables.    Expected Outcome Short; Continue to use crockpot  Long; Contiue to follow heart healhty guidelines             Psychosocial: Target Goals: Acknowledge presence or absence of significant depression and/or stress, maximize coping skills, provide positive support system. Participant is able to verbalize types and ability to use techniques and skills needed for reducing stress and depression.   Education: Stress, Anxiety, and Depression - Group verbal and visual presentation to define topics covered.  Reviews how body is impacted by stress, anxiety, and depression.  Also discusses healthy ways to reduce stress and to treat/manage anxiety and depression.  Written material given at graduation. Flowsheet Row Cardiac Rehab from 12/30/2021 in [facility] Cardiac and Pulmonary Rehab  Date 11/04/21  Educator [facility]  Instruction Review Code 1- [employer] Understanding       Education: Sleep  Hygiene -Provides group verbal and written instruction about how sleep can affect your health.  Define sleep hygiene, discuss sleep cycles and impact of sleep habits. Review good sleep hygiene tips.    Initial Review & Psychosocial Screening:  Initial Psych Review & Screening - 09/29/21 1540       Initial Review   Current issues with Current Stress Concerns;Current Sleep Concerns    Source of Stress Concerns Chronic Illness      Family Dynamics   Good Support System? Yes   family     Barriers   Psychosocial barriers to participate in program There are no identifiable barriers or psychosocial needs.;The patient should benefit from training in stress management and relaxation.      Screening Interventions   Interventions Encouraged to exercise;Provide feedback about the scores to participant;To provide support and resources with identified psychosocial needs    Expected Outcomes Short Term goal: Utilizing psychosocial counselor, staff and physician to assist with identification of specific Stressors or current issues interfering with healing process. Setting desired goal for each stressor or current issue identified.;Long Term Goal: Stressors or current issues are controlled or eliminated.;Short Term goal: Identification and review with participant of any Quality of Life or Depression concerns found by scoring the questionnaire.;Long Term goal: The participant improves quality of Life and PHQ9 Scores as seen by post scores and/or verbalization of changes             Quality of Life Scores:   Quality of Life - 10/11/21 1527       Quality of Life   Select Quality of Life      Quality of Life Scores   Health/Function Pre 22.7 %    Socioeconomic Pre 26.14 %    Psych/Spiritual Pre 24.86 %    Family Pre 25.2 %    GLOBAL Pre 24.22 %            Scores of 19 and below  usually indicate a poorer quality of life in these areas.  A difference of  2-3 points is a clinically meaningful  difference.  A difference of 2-3 points in the total score of the Quality of Life Index has been associated with significant improvement in overall quality of life, self-image, physical symptoms, and general health in studies assessing change in quality of life.  PHQ-9: Review Flowsheet       11/16/2021 11/09/2021 10/14/2021 10/11/2021  Depression screen PHQ 2/9  Decreased Interest 0 0 0 3  Down, Depressed, Hopeless 0 0 1 1  PHQ - 2 Score 0 0 1 4  Altered sleeping 1 1 - 1  Tired, decreased energy 0 1 - 1  Change in appetite 0 0 - 1  Feeling bad or failure about yourself  0 0 - 0  Trouble concentrating 1 1 - 1  Moving slowly or fidgety/restless 0 0 - 1  Suicidal thoughts 0 0 - 0  PHQ-9 Score 2 3 - 9  Difficult doing work/chores - Somewhat difficult - Somewhat difficult   Interpretation of Total Score  Total Score Depression Severity:  1-4 = Minimal depression, 5-9 = Mild depression, 10-14 = Moderate depression, 15-19 = Moderately severe depression, 20-27 = Severe depression   Psychosocial Evaluation and Intervention:  Psychosocial Evaluation - 09/29/21 1549       Psychosocial Evaluation & Interventions   Interventions Encouraged to exercise with the program and follow exercise prescription;Stress management education;Relaxation education    Comments Ms. Rayborn was surprised her heart was having issues and is really wanting to gain more knowledge about her heart failure diagnosis. She states she has a history of bronchitis and asthma, but never thought her heart would be giving her issues. She has changed her diet and is starting to exercise more. Her sleep has been a struggle for a while, in that she wakes up early morning and has a hard time falling back asleep. She has talked with her doctor about this and is trying to still lay there and rest to help her heart. Her family is involved in her care.    Expected Outcomes Short: attend cardiac rehab for education. Long: develop and  maintain positive self care habits.    Continue Psychosocial Services  Follow up required by staff             Psychosocial Re-Evaluation:  Psychosocial Re-Evaluation     [place] Name 10/28/21 1115 11/18/21 1425 12/23/21 1111         Psychosocial Re-Evaluation   Current issues with Current Stress Concerns;Current Sleep Concerns Current Stress Concerns;Current Sleep Concerns Current Stress Concerns;Current Sleep Concerns     Comments Daniqua  is doing well in rehab.  Her biggest stressor is her health. She does wake up around 3-4am for an hour and then finally back to sleep.  She has talked to her doctor about it and they want to do a sleep study with her.  Her sister  that she was helping to feed has now reached the point for Hospice.  She is 60 and not a candidate for dialysis.  She is unable to care for herself.  They all take turns watching her and helping.  Azaleah is adjusting to knowing about her heart failure.  She wants to work to get her EF back up and get better to keep going.  She is scheduled for an MRI in January and considering an ICD. Estie is still struggling with sleep. She has yet to  schedule her sleep study and was encouraged to call again. Her doctor told her to try Melatonin extended release versus regular to see if that helps too. She is still caring for her sister at her assisted home which can be stressful sometimes for her. She has been enjoying the program so far and hopes to keep up her strength and improve her EF. Ane is doing well in rehab.  She has picked back up her day to go feed her sister! She is happy to be able to help again.  She got a good report from heart failure clinic this morning.  She is sleeping well again. She has started taking melatonin earilier in evening to help.  She will talk to doctor about sleep study in January.   She also got good news that she will not have a copay going into new year so she will be able to continue to attend rehab.      Expected Outcomes Short: Get sleep study Long: Continue to exericse to for mental boost Short: Schedule sleep study, talk to doctor about options to help with sleep Long: Continue to maintain positive attitude and utilize exercise for stress management Short: Continue to use melatonin to help sleep Long: Continue to stay positive     Interventions Encouraged to attend Cardiac Rehabilitation for the exercise Encouraged to attend Cardiac Rehabilitation for the exercise Encouraged to attend Cardiac Rehabilitation for the exercise     Continue Psychosocial Services  Follow up required by staff Follow up required by staff Follow up required by staff              Psychosocial Discharge (Final Psychosocial Re-Evaluation):  Psychosocial Re-Evaluation - 12/23/21 1111       Psychosocial Re-Evaluation   Current issues with Current Stress Concerns;Current Sleep Concerns    Comments Keayra is doing well in rehab.  She has picked back up her day to go feed her sister! She is happy to be able to help again.  She got a good report from heart failure clinic this morning.  She is sleeping well again. She has started taking melatonin earilier in evening to help.  She will talk to doctor about sleep study in January.   She also got good news that she will not have a copay going into new year so she will be able to continue to attend rehab.    Expected Outcomes Short: Continue to use melatonin to help sleep Long: Continue to stay positive    Interventions Encouraged to attend Cardiac Rehabilitation for the exercise    Continue Psychosocial Services  Follow up required by staff             Vocational Rehabilitation: Provide vocational rehab assistance to qualifying candidates.   Vocational Rehab Evaluation & Intervention:  Vocational Rehab - 09/29/21 1540       Initial Vocational Rehab Evaluation & Intervention   Assessment shows need for Vocational Rehabilitation No              Education: Education Goals: Education classes will be provided on a variety of topics geared toward better understanding of heart health and risk factor modification. Participant will state understanding/return demonstration of topics presented as noted by education test scores.  Learning Barriers/Preferences:  Learning Barriers/Preferences - 09/29/21 1538       Learning Barriers/Preferences   Learning Barriers None    Learning Preferences Individual Instruction             General Cardiac  Education Topics:  AED/CPR: - Group verbal and written instruction with the use of models to demonstrate the basic use of the AED with the basic ABC's of resuscitation.   Anatomy and Cardiac Procedures: - Group verbal and visual presentation and models provide information about basic cardiac anatomy and function. Reviews the testing methods done to diagnose heart disease and the outcomes of the test results. Describes the treatment choices: Medical Management, Angioplasty, or Coronary Bypass Surgery for treating various heart conditions including Myocardial Infarction, Angina, Valve Disease, and Cardiac Arrhythmias.  Written material given at graduation. Flowsheet Row Cardiac Rehab from 12/30/2021 in [facility] Cardiac and Pulmonary Rehab  Date 12/16/21  Educator SB  Instruction Review Code 1- Verbalizes Understanding       Medication Safety: - Group verbal and visual instruction to review commonly prescribed medications for heart and lung disease. Reviews the medication, class of the drug, and side effects. Includes the steps to properly store meds and maintain the prescription regimen.  Written material given at graduation. Flowsheet Row Cardiac Rehab from 12/30/2021 in [facility] Cardiac and Pulmonary Rehab  Date 12/23/21  Educator SB  Instruction Review Code 1- Verbalizes Understanding       Intimacy: - Group verbal instruction through game format to discuss how heart and lung disease can  affect sexual intimacy. Written material given at graduation.. Flowsheet Row Cardiac Rehab from 12/30/2021 in [facility] Cardiac and Pulmonary Rehab  Date 11/18/21  Educator [facility]  Instruction Review Code 1- Verbalizes Understanding       Know Your Numbers and Heart Failure: - Group verbal and visual instruction to discuss disease risk factors for cardiac and pulmonary disease and treatment options.  Reviews associated critical values for Overweight/Obesity, Hypertension, Cholesterol, and Diabetes.  Discusses basics of heart failure: signs/symptoms and treatments.  Introduces Heart Failure Zone chart for action plan for heart failure.  Written material given at graduation. Flowsheet Row Cardiac Rehab from 12/30/2021 in [facility] Cardiac and Pulmonary Rehab  Date 12/30/21  Educator SB  Instruction Review Code 1- Verbalizes Understanding       Infection Prevention: - Provides verbal and written material to individual with discussion of infection control including proper hand washing and proper equipment cleaning during exercise session. Flowsheet Row Cardiac Rehab from 12/30/2021 in [facility] Cardiac and Pulmonary Rehab  Date 10/11/21  Educator [facility]  Instruction Review Code 1- Verbalizes Understanding       Falls Prevention: - Provides verbal and written material to individual with discussion of falls prevention and safety. Flowsheet Row Cardiac Rehab from 12/30/2021 in [facility] Cardiac and Pulmonary Rehab  Date 10/11/21  Educator [facility]  Instruction Review Code 1- Verbalizes Understanding       Other: -Provides group and verbal instruction on various topics (see comments)   Knowledge Questionnaire Score:  Knowledge Questionnaire Score - 10/11/21 1527       Knowledge Questionnaire Score   Pre Score 24/26             Core Components/Risk Factors/Patient Goals at Admission:  Personal Goals and Risk Factors at Admission - 10/11/21 1525       Core Components/Risk Factors/Patient Goals on  Admission    Weight Management Yes    Intervention Weight Management: Develop a combined nutrition and exercise program designed to reach desired caloric intake, while maintaining appropriate intake of nutrient and fiber, sodium and fats, and appropriate energy expenditure required for the weight goal.;Weight Management: Provide education and appropriate resources to help participant work on and attain dietary goals.  Admit Weight 121 lb 1.6 oz (54.9 kg)    Goal Weight: Short Term 125 lb (56.7 kg)    Goal Weight: Long Term 130 lb (59 kg)    Expected Outcomes Short Term: Continue to assess and modify interventions until short term weight is achieved;Long Term: Adherence to nutrition and physical activity/exercise program aimed toward attainment of established weight goal;Understanding recommendations for meals to include 15-35% energy as protein, 25-35% energy from fat, 35-60% energy from carbohydrates, less than 210m of dietary cholesterol, 20-35 gm of total fiber daily;Understanding of distribution of calorie intake throughout the day with the consumption of 4-5 meals/snacks;Weight Gain: Understanding of general recommendations for a high calorie, high protein meal plan that promotes weight gain by distributing calorie intake throughout the day with the consumption for 4-5 meals, snacks, and/or supplements    Heart Failure Yes    Intervention Provide a combined exercise and nutrition program that is supplemented with education, support and counseling about heart failure. Directed toward relieving symptoms such as shortness of breath, decreased exercise tolerance, and extremity edema.    Expected Outcomes Improve functional capacity of life;Short term: Attendance in program 2-3 days a week with increased exercise capacity. Reported lower sodium intake. Reported increased fruit and vegetable intake. Reports medication compliance.;Short term: Daily weights obtained and reported for increase. Utilizing  diuretic protocols set by physician.;Long term: Adoption of self-care skills and reduction of barriers for early signs and symptoms recognition and intervention leading to self-care maintenance.    Hypertension Yes    Intervention Provide education on lifestyle modifcations including regular physical activity/exercise, weight management, moderate sodium restriction and increased consumption of fresh fruit, vegetables, and low fat dairy, alcohol moderation, and smoking cessation.;Monitor prescription use compliance.    Expected Outcomes Short Term: Continued assessment and intervention until BP is < 140/989mHG in hypertensive participants. < 130/8068mG in hypertensive participants with diabetes, heart failure or chronic kidney disease.;Long Term: Maintenance of blood pressure at goal levels.    Lipids Yes    Intervention Provide education and support for participant on nutrition & aerobic/resistive exercise along with prescribed medications to achieve LDL <47m38mDL >40mg39m Expected Outcomes Short Term: Participant states understanding of desired cholesterol values and is compliant with medications prescribed. Participant is following exercise prescription and nutrition guidelines.;Long Term: Cholesterol controlled with medications as prescribed, with individualized exercise RX and with personalized nutrition plan. Value goals: LDL < 47mg,29m > 40 mg.             Education:Diabetes - Individual verbal and written instruction to review signs/symptoms of diabetes, desired ranges of glucose level fasting, after meals and with exercise. Acknowledge that pre and post exercise glucose checks will be done for 3 sessions at entry of program.   Core Components/Risk Factors/Patient Goals Review:   Goals and Risk Factor Review     Row Name 10/28/21 1126 11/18/21 1428 12/23/21 1106         Core Components/Risk Factors/Patient Goals Review   Personal Goals Review Weight Management/Obesity;Heart  Failure;Hypertension;Lipids Weight Management/Obesity;Heart Failure;Hypertension Weight Management/Obesity;Heart Failure;Hypertension     Review MargarAleilaing well in rehab.  She has started to gain again and up 2 lb.  Her blood pressures and blood sugars are doing well and she checks them at home routinely.  She denies any heart failure symptoms.  She will have her echo in January for ICD consideration.  We talked about what the ICD looks like and what it does for her.  She is doing well on her medications. Baudelia continues to do well. She is trying to gain 10 lbs in total as her weight goal and is trying to change her diet to gear more towards heart healthy but also enough to promote weight gain. She is incorporating more protein in her diet. She denies any symptoms with heart failure and is aware to make note of any sudden weight gain if she sees any on the scale. She is anxious to see how her EF is improving with exercise and will be re-evaluated for an ICD in January, pending on her test results. All in all, she checks her BP almost everyday and stays stable, anywhere between 110-120s/ 60-70s. Rocklyn is doing well in rehab. She just had an appointment  with Heart Failure clinic and got a good report.  They are pleased with her progress. She is able to continue attend rehab without a copay going into the new year.  Her pressures are doing well.  Her weight is staying steady.  She is scheduled for her cardiac MRI on January 2 and then they will make the decision about ICD. Her follow up appt will be Jan 29.  She also has her annual PCP appt in January.     Expected Outcomes Short: Continue to work on weight gain Long: Continue to monitor risk factors. Short: Continue to monitor weight Long: Hit long-term weight gain goal of 10+ lb. Short: Continue to monitor heart failure Long: Continue to monitor risk factors.              Core Components/Risk Factors/Patient Goals at Discharge (Final Review):    Goals and Risk Factor Review - 12/23/21 1106       Core Components/Risk Factors/Patient Goals Review   Personal Goals Review Weight Management/Obesity;Heart Failure;Hypertension    Review Shaakira is doing well in rehab. She just had an appointment  with Heart Failure clinic and got a good report.  They are pleased with her progress. She is able to continue attend rehab without a copay going into the new year.  Her pressures are doing well.  Her weight is staying steady.  She is scheduled for her cardiac MRI on January 2 and then they will make the decision about ICD. Her follow up appt will be Jan 29.  She also has her annual PCP appt in January.    Expected Outcomes Short: Continue to monitor heart failure Long: Continue to monitor risk factors.             ITP Comments:  ITP Comments     Row Name 09/29/21 1539 10/11/21 1514 10/12/21 1116 10/13/21 0704 11/10/21 0920   ITP Comments Initial telephone orientation completed. Diagnosis can be found in [facility] 8/16. EP orientation scheduled for Monday 10/2 at 1:30pm. Completed 6MWT and gym orientation. Initial ITP created and sent for review to Dr. Emily Filbert, Medical Director. First full day of exercise!  Patient was oriented to gym and equipment including functions, settings, policies, and procedures.  Patient's individual exercise prescription and treatment plan were reviewed.  All starting workloads were established based on the results of the 6 minute walk test done at initial orientation visit.  The plan for exercise progression was also introduced and progression will be customized based on patient's performance and goals 30 Day review completed. Medical Director ITP review done, changes made as directed, and signed approval by Medical Director.   New to program 30 Day review completed. Medical Director ITP review done,  changes made as directed, and signed approval by Medical Director.    [place] Name 12/08/21 0752 01/05/22 1005         ITP  Comments 30 Day review completed. Medical Director ITP review done, changes made as directed, and signed approval by Medical Director. 30 Day review completed. Medical Director ITP review done, changes made as directed, and signed approval by Medical Director.               Comments:

## 2022-01-06 ENCOUNTER — Encounter: Payer: Medicare Other | Admitting: *Deleted

## 2022-01-06 DIAGNOSIS — I5022 Chronic systolic (congestive) heart failure: Secondary | ICD-10-CM

## 2022-01-06 NOTE — Progress Notes (Signed)
Discharge Summary   Glenda May  DOB 2045-03-27  Glenda May graduated today from  rehab with 36 sessions completed.  Details of the patient's exercise prescription and what Glenda May needs to do in order to continue the prescription and progress were discussed with patient.  Patient was given a copy of prescription and goals.  Patient verbalized understanding.  Glenda May plans to continue to exercise by walking.   [place] Name 10/11/21 1515 12/28/21 1345       6 Minute Walk   Phase Initial Discharge    Distance 1360 feet 1605 feet    Distance % Change -- 18.01 %    Distance Feet Change -- 245 ft    Walk Time 6 minutes 6 minutes    # of Rest Breaks 0 0    MPH 2.58 3.03    METS 2.98 3.55    RPE 13 13    Perceived Dyspnea  1 0    VO2 Peak 10.44 12.45    Symptoms No No    Resting HR 83 bpm 88 bpm    Resting BP 124/62 116/62    Resting Oxygen Saturation  100 % 99 %    Exercise Oxygen Saturation  during 6 min walk 100 % 98 %    Max Ex. HR 106 bpm 122 bpm    Max Ex. BP 142/78 142/62    2 Minute Post BP 114/62 --

## 2022-01-06 NOTE — Progress Notes (Signed)
Daily Session Note  Patient Details  Name: Glenda May MRN: [id number] Date of Birth: 12-Feb-1945 Referring Provider:   Flowsheet Row Cardiac Rehab from 10/11/2021 in [facility]  Referring Provider Emily Filbert       Encounter Date: 01/06/2022  Check In:  Session Check In - 01/06/22 1112       Check-In   Supervising physician immediately available to respond to emergencies See telemetry face sheet for immediately available ER MD    Location [facility]    Staff Present Darlyne Russian, RN, ADN;Meredith Sherryll Burger, RN BSN;Laureen Owens Shark, BS, RRT, CPFT;Kara Maricela Bo, MS, ACSM CEP, Exercise Physiologist    Virtual Visit No    Medication changes reported     No    Fall or balance concerns reported    No    Warm-up and Cool-down Performed on first and last piece of equipment    Resistance Training Performed Yes    VAD Patient? No    PAD/SET Patient? No      Pain Assessment   Currently in Pain? No/denies                Social History   Tobacco Use  Smoking Status Never  Smokeless Tobacco Not on file    Goals Met:  Independence with exercise equipment Exercise tolerated well No report of concerns or symptoms today Strength training completed today  Goals Unmet:  Not Applicable  Comments:  Glenda May graduated today from  rehab with 36 sessions completed.  Details of the patient's exercise prescription and what She needs to do in order to continue the prescription and progress were discussed with patient.  Patient was given a copy of prescription and goals.  Patient verbalized understanding.  Glenda May plans to continue to exercise by walking.     Dr. Emily Filbert is Medical Director for [place].  Dr. Ottie Glazier is Medical Director for [facility].

## 2022-01-06 NOTE — Progress Notes (Signed)
Cardiac Individual Treatment Plan  Patient Details  Name: Glenda May MRN: [id number] Date of Birth: May 03, 1945 Referring Provider:   Flowsheet Row Cardiac Rehab from 10/11/2021 in [facility] Cardiac and Pulmonary Rehab  Referring Provider Emily Filbert       Initial Encounter Date:  Flowsheet Row Cardiac Rehab from 10/11/2021 in [facility] Cardiac and Pulmonary Rehab  Date 10/11/21       Visit Diagnosis: Heart failure, chronic systolic ([place])  Patient's Home Medications on Admission:  Current Outpatient Medications:    albuterol (VENTOLIN HFA) 108 (90 Base) MCG/ACT inhaler, Inhale 2 puffs into the lungs every 6 (six) hours as needed., Disp: , Rfl:    aspirin EC 81 MG tablet, Take 1 tablet (81 mg total) by mouth daily. Swallow whole., Disp: 30 tablet, Rfl: 12   Calcium Carbonate-Vit D-Min (CALTRATE 600+D PLUS MINERALS) 600-800 MG-UNIT TABS, Take 1 tablet by mouth daily., Disp: , Rfl:    dapagliflozin propanediol (FARXIGA) 10 MG TABS tablet, Take 1 tablet (10 mg total) by mouth daily., Disp: 30 tablet, Rfl: 2   DEXILANT 60 MG capsule, Take 1 capsule by mouth daily., Disp: , Rfl:    furosemide (LASIX) 40 MG tablet, Take 0.5 tablets (20 mg total) by mouth daily. And extra 31m PRN for weight gain, swelling or SOB, Disp: 30 tablet, Rfl: 3   loratadine (CLARITIN) 10 MG tablet, Take 10 mg by mouth daily., Disp: , Rfl:    Melatonin 5 MG CAPS, Take 5 mg by mouth at bedtime., Disp: , Rfl:    metoprolol succinate (TOPROL-XL) 25 MG 24 hr tablet, Take 1 tablet (25 mg total) by mouth daily., Disp: 90 tablet, Rfl: 3   Multiple Vitamins-Minerals (WOMENS MULTI GUMMIES PO), Take 1 each by mouth daily., Disp: , Rfl:    nitroGLYCERIN (NITROSTAT) 0.4 MG SL tablet, Place 0.4 mg under the tongue every 5 (five) minutes as needed for chest pain., Disp: , Rfl:    rosuvastatin (CRESTOR) 20 MG tablet, Take 20 mg by mouth daily., Disp: , Rfl:    spironolactone (ALDACTONE) 25 MG tablet, Take 0.5 tablets (12.5 mg total)  by mouth daily., Disp: 30 tablet, Rfl: 3  Past Medical History: Past Medical History:  Diagnosis Date   Allergic rhinitis    CHF (congestive heart failure) (HCC)    High cholesterol    Hypertension     Tobacco Use: Social History   Tobacco Use  Smoking Status Never  Smokeless Tobacco Not on file    Labs: Review Flowsheet       Latest Ref Rng & Units 08/28/2021  Labs for ITP Cardiac and Pulmonary Rehab  Cholestrol 0 - 200 mg/dL 183   LDL (calc) 0 - 99 mg/dL 97   HDL-C >40 mg/dL 56   Trlycerides <150 mg/dL 151   Hemoglobin A1c 4.8 - 5.6 % 5.9      Exercise Target Goals: Exercise Program Goal: Individual exercise prescription set using results from initial 6 min walk test and THRR while considering  patient's activity barriers and safety.   Exercise Prescription Goal: Initial exercise prescription builds to 30-45 minutes a day of aerobic activity, 2-3 days per week.  Home exercise guidelines will be given to patient during program as part of exercise prescription that the participant will acknowledge.   Education: Aerobic Exercise: - Group verbal and visual presentation on the components of exercise prescription. Introduces F.I.T.T principle from ACSM for exercise prescriptions.  Reviews F.I.T.T. principles of aerobic exercise including progression. Written material given at  graduation. Flowsheet Row Cardiac Rehab from 01/06/2022 in [facility] Cardiac and Pulmonary Rehab  Education need identified 10/11/21       Education: Resistance Exercise: - Group verbal and visual presentation on the components of exercise prescription. Introduces F.I.T.T principle from ACSM for exercise prescriptions  Reviews F.I.T.T. principles of resistance exercise including progression. Written material given at graduation.    Education: Exercise & Equipment Safety: - Individual verbal instruction and demonstration of equipment use and safety with use of the equipment. Flowsheet Row Cardiac  Rehab from [facility]06/20[facility] in [facility] Cardiac and Pulmonary Rehab  Date 10/11/21  Educator [facility]  Instruction Review Code 1- Verbalizes Understanding       Education: Exercise Physiology & General Exercise Guidelines: - Group verbal and written instruction with models to review the exercise physiology of the cardiovascular system and associated critical values. Provides general exercise guidelines with specific guidelines to those with heart or lung disease.  Flowsheet Row Cardiac Rehab from 01/06/2022 in [facility] Cardiac and Pulmonary Rehab  Date 11/11/21  Educator [employer] [facility]  Instruction Review Code 1- Verbalizes Understanding       Education: Flexibility, Balance, Mind/Body Relaxation: - Group verbal and visual presentation with interactive activity on the components of exercise prescription. Introduces F.I.T.T principle from ACSM for exercise prescriptions. Reviews F.I.T.T. principles of flexibility and balance exercise training including progression. Also discusses the mind body connection.  Reviews various relaxation techniques to help reduce and manage stress (i.e. Deep breathing, progressive muscle relaxation, and visualization). Balance handout provided to take home. Written material given at graduation.   Activity Barriers & Risk Stratification:  Activity Barriers & Cardiac Risk Stratification - 09/29/21 1534       Activity Barriers & Cardiac Risk Stratification   Activity Barriers Back Problems    Cardiac Risk Stratification High             6 Minute Walk:  6 Minute Walk     Row Name 10/11/21 1515 12/28/21 1345       6 Minute Walk   Phase Initial Discharge    Distance 1360 feet 1605 feet    Distance % Change -- 18.01 %    Distance Feet Change -- 245 ft    Walk Time 6 minutes 6 minutes    # of Rest Breaks 0 0    MPH 2.58 3.03    METS 2.98 3.55    RPE 13 13    Perceived Dyspnea  1 0    VO2 Peak 10.44 12.45    Symptoms No No    Resting HR 83 bpm 88 bpm    Resting BP 124/62  116/62    Resting Oxygen Saturation  100 % 99 %    Exercise Oxygen Saturation  during 6 min walk 100 % 98 %    Max Ex. HR 106 bpm 122 bpm    Max Ex. BP 142/78 142/62    2 Minute Post BP 114/62 --             Oxygen Initial Assessment:   Oxygen Re-Evaluation:   Oxygen Discharge (Final Oxygen Re-Evaluation):   Initial Exercise Prescription:  Initial Exercise Prescription - 10/11/21 1500       Date of Initial Exercise RX and Referring Provider   Date 10/11/21    Referring Provider Emily Filbert      Oxygen   Maintain Oxygen Saturation 88% or higher      Treadmill   MPH 2.3    Grade 0.5    Minutes  15    METs 2.92      Recumbant Bike   Level 2    RPM 50    Minutes 15    METs 2.98      NuStep   Level 2    SPM 80    Minutes 15    METs 2.92      REL-XR   Level 2    Speed 50    Minutes 15    METs 2.92      T5 Nustep   Level 1    SPM 80    Minutes 15    METs 2.92      Biostep-RELP   Level 2    SPM 50    Minutes 15    METs 2.92      Prescription Details   Frequency (times per week) 2    Duration Progress to 30 minutes of continuous aerobic without signs/symptoms of physical distress      Intensity   THRR 40-80% of Max Heartrate 107-130    Ratings of Perceived Exertion 11-13    Perceived Dyspnea 0-4      Progression   Progression Continue to progress workloads to maintain intensity without signs/symptoms of physical distress.      Resistance Training   Training Prescription Yes    Weight 3    Reps 10-15             Perform Capillary Blood Glucose checks as needed.  Exercise Prescription Changes:   Exercise Prescription Changes     Row Name 10/11/21 1500 10/28/21 1100 10/28/21 1600 11/09/21 1500 11/24/21 1300     Response to Exercise   Blood Pressure (Admit) 124/62 -- 112/62 118/62 134/76   Blood Pressure (Exercise) 142/78 -- 140/66 132/58 --   Blood Pressure (Exit) 114/62 -- 104/62 122/62 124/78   Heart Rate (Admit) 83 bpm --  82 bpm 81 bpm 78 bpm   Heart Rate (Exercise) 106 bpm -- 125 bpm 127 bpm 124 bpm   Heart Rate (Exit) 84 bpm -- 90 bpm 97 bpm 85 bpm   Oxygen Saturation (Admit) 100 % -- -- -- --   Oxygen Saturation (Exercise) 100 % -- -- -- --   Oxygen Saturation (Exit) 100 % -- -- -- --   Rating of Perceived Exertion (Exercise) 13 -- _0 Perceived Dyspnea (Exercise) 1 -- 0 -- --   Symptoms none -- none none none   Comments 6 MWT results -- First 3 days of rehab -- --   Duration -- -- Continue with 30 min of aerobic exercise without signs/symptoms of physical distress. Continue with 30 min of aerobic exercise without signs/symptoms of physical distress. Continue with 30 min of aerobic exercise without signs/symptoms of physical distress.   Intensity -- -- THRR unchanged THRR unchanged THRR unchanged     Progression   Progression -- -- Continue to progress workloads to maintain intensity without signs/symptoms of physical distress. Continue to progress workloads to maintain intensity without signs/symptoms of physical distress. Continue to progress workloads to maintain intensity without signs/symptoms of physical distress.   Average METs -- -- 2.74 3.83 3.19     Resistance Training   Training Prescription -- -- Yes Yes Yes   Weight -- -- 3 lb 3 lb 3 lb   Reps -- -- 10-15 10-15 10-15     Interval Training   Interval Training -- -- No No No     Treadmill   MPH -- --  2.3 3 2.5   Grade -- -- _0 Minutes -- -- _1 METs -- -- 3.08 3.71 3.6     Recumbant Bike   Level -- -- -- 4 3   Minutes -- -- -- 15 15   METs -- -- -- 4.03 4.03     NuStep   Level -- -- -- 4 3   Minutes -- -- -- 15 15   METs -- -- -- 2.7 2.9     REL-XR   Level -- -- 2 5 --   Minutes -- -- 15 15 --   METs -- -- 2.9 5.7 --     T5 Nustep   Level -- -- 1 -- --   Minutes -- -- 15 -- --   METs -- -- 2.1 -- --     Biostep-RELP   Level -- -- _2 Minutes -- -- _3 METs -- -- _4 Track   Laps  -- -- 32 30 37   Minutes -- -- _5 METs -- -- 2.63 2.63 3.28     Home Exercise Plan   Plans to continue exercise at -- Home (comment)  walking, staff videos Home (comment)  walking, staff videos Home (comment)  walking, staff videos Home (comment)  walking, staff videos   Frequency -- Add 3 additional days to program exercise sessions. Add 3 additional days to program exercise sessions. Add 3 additional days to program exercise sessions. Add 3 additional days to program exercise sessions.   Initial Home Exercises Provided -- 10/28/21 10/28/21 10/28/21 10/28/21     Oxygen   Maintain Oxygen Saturation -- -- 88% or higher 88% or higher 88% or higher    Row Name 12/07/21 1500 12/23/21 1400           Response to Exercise   Blood Pressure (Admit) 100/60 102/58      Blood Pressure (Exit) 124/60 100/60      Heart Rate (Admit) 81 bpm 86 bpm      Heart Rate (Exercise) 121 bpm 128 bpm      Heart Rate (Exit) 94 bpm 92 bpm      Rating of Perceived Exertion (Exercise) 15 14      Symptoms none none      Duration Continue with 30 min of aerobic exercise without signs/symptoms of physical distress. Continue with 30 min of aerobic exercise without signs/symptoms of physical distress.      Intensity THRR unchanged THRR unchanged        Progression   Progression Continue to progress workloads to maintain intensity without signs/symptoms of physical distress. Continue to progress workloads to maintain intensity without signs/symptoms of physical distress.      Average METs 4.34 4.08        Resistance Training   Training Prescription Yes Yes      Weight 3 lb 3 lb      Reps 10-15 10-15        Interval Training   Interval Training No No        Treadmill   MPH 3.2 3.5      Grade 5 1.5      Minutes 15 15      METs 5.66 4.4        Recumbant Bike   Level 5 6      Watts 34 36  Minutes 15 15      METs 4.04 4.02        REL-XR   Level 5 7      Minutes 15 15      METs 5.3 5.1         Biostep-RELP   Level -- 3      Minutes -- 15      METs -- 3        Track   Laps 40 44      Minutes 15 15      METs 3.18 3.39        Home Exercise Plan   Plans to continue exercise at Home (comment)  walking, staff videos Home (comment)  walking, staff videos      Frequency Add 3 additional days to program exercise sessions. Add 3 additional days to program exercise sessions.      Initial Home Exercises Provided 10/28/21 10/28/21        Oxygen   Maintain Oxygen Saturation 88% or higher 88% or higher               Exercise Comments:   Exercise Comments     Row Name 10/12/21 1117           Exercise Comments First full day of exercise!  Patient was oriented to gym and equipment including functions, settings, policies, and procedures.  Patient's individual exercise prescription and treatment plan were reviewed.  All starting workloads were established based on the results of the 6 minute walk test done at initial orientation visit.  The plan for exercise progression was also introduced and progression will be customized based on patient's performance and goals                Exercise Goals and Review:   Exercise Goals     Row Name 10/11/21 1522             Exercise Goals   Increase Physical Activity Yes       Intervention Provide advice, education, support and counseling about physical activity/exercise needs.       Expected Outcomes Short Term: Attend rehab on a regular basis to increase amount of physical activity.;Long Term: Add in home exercise to make exercise part of routine and to increase amount of physical activity.;Long Term: Exercising regularly at least 3-5 days a week.       Increase Strength and Stamina Yes       Intervention Develop an individualized exercise prescription for aerobic and resistive training based on initial evaluation findings, risk stratification, comorbidities and participant's personal goals.;Provide advice, education, support  and counseling about physical activity/exercise needs.       Expected Outcomes Short Term: Increase workloads from initial exercise prescription for resistance, speed, and METs.;Short Term: Perform resistance training exercises routinely during rehab and add in resistance training at home;Long Term: Improve cardiorespiratory fitness, muscular endurance and strength as measured by increased METs and functional capacity (6MWT)       Able to understand and use rate of perceived exertion (RPE) scale Yes       Intervention Provide education and explanation on how to use RPE scale       Expected Outcomes Short Term: Able to use RPE daily in rehab to express subjective intensity level;Long Term:  Able to use RPE to guide intensity level when exercising independently       Able to understand and use Dyspnea scale Yes  Intervention Provide education and explanation on how to use Dyspnea scale       Expected Outcomes Short Term: Able to use Dyspnea scale daily in rehab to express subjective sense of shortness of breath during exertion;Long Term: Able to use Dyspnea scale to guide intensity level when exercising independently       Knowledge and understanding of Target Heart Rate Range (THRR) Yes       Intervention Provide education and explanation of THRR including how the numbers were predicted and where they are located for reference       Expected Outcomes Short Term: Able to state/look up THRR;Short Term: Able to use daily as guideline for intensity in rehab;Long Term: Able to use THRR to govern intensity when exercising independently       Able to check pulse independently Yes       Intervention Provide education and demonstration on how to check pulse in carotid and radial arteries.;Review the importance of being able to check your own pulse for safety during independent exercise       Expected Outcomes Short Term: Able to explain why pulse checking is important during independent exercise;Long Term:  Able to check pulse independently and accurately       Understanding of Exercise Prescription Yes       Intervention Provide education, explanation, and written materials on patient's individual exercise prescription       Expected Outcomes Short Term: Able to explain program exercise prescription;Long Term: Able to explain home exercise prescription to exercise independently                Exercise Goals Re-Evaluation :  Exercise Goals Re-Evaluation     Row Name 10/12/21 1117 10/28/21 1108 10/28/21 1607 11/09/21 1601 11/18/21 1416     Exercise Goal Re-Evaluation   Exercise Goals Review Increase Physical Activity;Able to understand and use rate of perceived exertion (RPE) scale;Knowledge and understanding of Target Heart Rate Range (THRR);Understanding of Exercise Prescription;Increase Strength and Stamina;Able to check pulse independently Increase Physical Activity;Able to understand and use rate of perceived exertion (RPE) scale;Knowledge and understanding of Target Heart Rate Range (THRR);Understanding of Exercise Prescription;Increase Strength and Stamina;Able to check pulse independently;Able to understand and use Dyspnea scale Increase Physical Activity;Understanding of Exercise Prescription;Increase Strength and Stamina Increase Physical Activity;Understanding of Exercise Prescription;Increase Strength and Stamina Increase Physical Activity;Understanding of Exercise Prescription;Increase Strength and Stamina   Comments Reviewed RPE and dyspnea scales, THR and program prescription with pt today.  Pt voiced understanding and was given a copy of goals to take home. Braylon is doing well in rehab.  She has noted that her stamina is getting better.  She is already walking on her off days.  Reviewed home exercise with pt today.  Pt plans to continues walking at home/park for exercise.  We also talked about using our staff videos as well.  Reviewed THR, pulse, RPE, sign and symptoms, pulse  oximetery and when to call 911 or MD.  Also discussed weather considerations and indoor options.  Pt voiced understanding. Rosalie is doing well in rehab. She had an overall average MET level of 2.74 METs. She got up to 32 laps on the track and improved to level 3 on the biostep. We will continue to monitor her progress in the program. Brieana continues to do well in rehab. she has significantly increased most of her workloads; specifically, she went to level 4 on both the T4 Nustep and recumbent bike. She also increased to level on  the XR working up to almost 6 METS! She would benefit from increasing her handweights to 4 lbs. Will continue to monitor. Erina is exercising at home and walking on her off days from rehab. She walks on the track for about 30 minutes at one time. 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  We reviewed her THR and encouraged to check her HR while exercising. She is encouraged to buy a pulse ox. She is also starting to do bodyweight strength exercises to incorporate her resistance training.   Expected Outcomes Short: Use RPE daily to regulate intensity. Long: Follow program prescription in THR. Short: Conitnue to walk on off days Long: Continue to improve stamina Short: Continue to attend cardiac rehab and follow current exercise prescription. Long: Continue to increase strength and stamina. Short: Increase resistance training to 4 lbs Long: Continue to increase overall MET level Short: Buy pulse ox, start checking HR Long: continue to exercise independently    Row Name 11/24/21 1347 12/07/21 1546 12/23/21 1109 12/23/21 1452       Exercise Goal Re-Evaluation   Exercise Goals Review Increase Physical Activity;Understanding of Exercise Prescription;Increase Strength and Stamina Increase Physical Activity;Understanding of Exercise Prescription;Increase Strength and Stamina Increase Physical Activity;Understanding of  Exercise Prescription;Increase Strength and Stamina Increase Physical Activity;Understanding of Exercise Prescription;Increase Strength and Stamina    Comments Beverlyann is doing well in rehab. She has consistently walked at an overall average MET level above 3 METs. She also increased her incline on the treadmill to 2% while maintaining a speed of 2.5 mph. She walked up to 37 laps on the track as well. We will continue to monitor her progress in the program. Tzivia continues to do well in rehab. She significantly increased her treadmill workload to a 3.2 mph speed/5 % incline, working over 5 METS. She also hit  a max of 40 laps on the track. She worked on the recumbent bike after a while and was able to work at 34 watts. She would benefit from increasing to 4 lbs for handweights. Will continue to monitor. Heer is doing well in rehab.  She has not been walking as much with the cooler weather.  She is getting a treadmill for Christmas to be able to keep going at home. She has noticed that her stamina is getting better and lasting longer throughout the day.  She has been able to start going back to helping her sister. Maragaret continues to do well in rehab. She recently improved to level 6 on the recumbent bike and level 7 on the XR. She has tolerated 3 lb weights for resistance training and may benefit from trying 4 lb. She was able to walk up to 44 laps on the track as well. She is also due for her post 6MWT soon and will look to improve on that. We will continue to monitor her progress.    Expected Outcomes Short: Continue to increase workloads as tolerated. Long: Continue to increase strength and stamina. Short: Increase to 4 lb handweights Long: Continue to increase overall MET level Short: Enjoy new treadmill Long: Continue to improve stamina Short: Continue to increase workloads and try 4 lb handweights. Long: Continue to improve strength and stamina.             Discharge Exercise Prescription  (Final Exercise Prescription Changes):  Exercise Prescription Changes - 12/23/21 1400       Response to Exercise  Blood Pressure (Admit) 102/58    Blood Pressure (Exit) 100/60    Heart Rate (Admit) 86 bpm    Heart Rate (Exercise) 128 bpm    Heart Rate (Exit) 92 bpm    Rating of Perceived Exertion (Exercise) 14    Symptoms none    Duration Continue with 30 min of aerobic exercise without signs/symptoms of physical distress.    Intensity THRR unchanged      Progression   Progression Continue to progress workloads to maintain intensity without signs/symptoms of physical distress.    Average METs 4.08      Resistance Training   Training Prescription Yes    Weight 3 lb    Reps 10-15      Interval Training   Interval Training No      Treadmill   MPH 3.5    Grade 1.5    Minutes 15    METs 4.4      Recumbant Bike   Level 6    Watts 36    Minutes 15    METs 4.02      REL-XR   Level 7    Minutes 15    METs 5.1      Biostep-RELP   Level 3    Minutes 15    METs 3      Track   Laps 44    Minutes 15    METs 3.39      Home Exercise Plan   Plans to continue exercise at Home (comment)   walking, staff videos   Frequency Add 3 additional days to program exercise sessions.    Initial Home Exercises Provided 10/28/21      Oxygen   Maintain Oxygen Saturation 88% or higher             Nutrition:  Target Goals: Understanding of nutrition guidelines, daily intake of sodium <1543m, cholesterol <2033m calories 30% from fat and 7% or less from saturated fats, daily to have 5 or more servings of fruits and vegetables.  Education: All About Nutrition: -Group instruction provided by verbal, written material, interactive activities, discussions, models, and posters to present general guidelines for heart healthy nutrition including fat, fiber, MyPlate, the role of sodium in heart healthy nutrition, utilization of the nutrition label, and utilization of this knowledge for  meal planning. Follow up email sent as well. Written material given at graduation. Flow[facility]  Education need identified 10/11/21  Date 11/25/21  Educator MCStickneyInstruction Review Code 1- Verbalizes Understanding       Biometrics:  Pre Biometrics - 10/11/21 1522       Pre Biometrics   Height 5' 2.75" (1.594 m)    Weight 121 lb 1.6 oz (54.9 kg)    Waist Circumference 30 inches    Hip Circumference 36.5 inches    Waist to Hip Ratio 0.82 %    BMI (Calculated) 21.62    Single Leg Stand 6.05 seconds             Post Biometrics - 12/28/21 1345        Post  Biometrics   Height 5' 2.75" (1.594 m)    Weight 123 lb 3.2 oz (55.9 kg)    Waist Circumference 28.5 inches    Hip Circumference 35.5 inches    Waist to Hip Ratio 0.8 %    BMI (Calculated) 21.99    Single Leg Stand 25.45 seconds  Nutrition Therapy Plan and Nutrition Goals:  Nutrition Therapy & Goals - 10/11/21 1525       Intervention Plan   Intervention Prescribe, educate and counsel regarding individualized specific dietary modifications aiming towards targeted core components such as weight, hypertension, lipid management, diabetes, heart failure and other comorbidities.    Expected Outcomes Short Term Goal: Understand basic principles of dietary content, such as calories, fat, sodium, cholesterol and nutrients.;Short Term Goal: A plan has been developed with personal nutrition goals set during dietitian appointment.;Long Term Goal: Adherence to prescribed nutrition plan.             Nutrition Assessments:  MEDIFICTS Score Key: ?70 Need to make dietary changes  40-70 Heart Healthy Diet ? 40 Therapeutic Level Cholesterol Diet  Flowsheet Row Cardiac Rehab from 01/06/2022 in [facility] Cardiac and Pulmonary Rehab  Picture Your Plate Total Score on Discharge 81      Picture Your Plate Scores: <49 Unhealthy dietary pattern with  much room for improvement. 41-50 Dietary pattern unlikely to meet recommendations for good health and room for improvement. 51-60 More healthful dietary pattern, with some room for improvement.  >60 Healthy dietary pattern, although there may be some specific behaviors that could be improved.    Nutrition Goals Re-Evaluation:  Nutrition Goals Re-Evaluation     [place] Name 10/28/21 1124 11/18/21 1423 12/23/21 1115         Goals   Nutrition Goal ST: add protein and fat to snack - almonds with fruit, add additional snack mid-afternoon - greek yogurt with fruit. LT: Gain 10 pounds, continue with heart healthy changes, meet energy and protein needs ST: add protein and fat to snack - almonds with fruit, add additional snack mid-afternoon - greek yogurt with fruit. LT: Gain 10 pounds, continue with heart healthy changes, meet energy and protein needs ST: add protein and fat to snack - almonds with fruit, add additional snack mid-afternoon - greek yogurt with fruit. LT: Gain 10 pounds, continue with heart healthy changes, meet energy and protein needs     Comment Jamielyn is doing well with her diet.  She is using her air fryer, grill, and baking.  She is eating chicken and fish.  She is doiing better with staying away from fried foods.  She has been watching her salt.  She is working to increase her protein intake.  She is still trying to gain weight. Sonji has been trying to add more protein in her diet. She has incorporated more peanut butter, nuts, greek yogurt, and fish into her diet. She is eating a rainbow of fruits which she loves. She has cut out a lot of sodium and fried foods. She is trying to gain some weight back but also trying to eat the appropriate things that are heart healthy. Keeli is doing well with her diet. She is staying away from her salt.  She has switched to soy milk. She is also staying away from fired foods.  She surprisingly likes eating fish and using her crockpot during the  week.  She continues to work on variety in her fruits and vegetables.     Expected Outcome Short: Continue to add in more protein Long: Continue to focus on heart healthy diet Short: Continue to follow RD recommendations Long: Continue to eat heart healthy, work toward 10 lb weight gain Short; Continue to use crockpot  Long; Contiue to follow heart healhty guidelines              Nutrition Goals  Discharge (Final Nutrition Goals Re-Evaluation):  Nutrition Goals Re-Evaluation - 12/23/21 1115       Goals   Nutrition Goal ST: add protein and fat to snack - almonds with fruit, add additional snack mid-afternoon - greek yogurt with fruit. LT: Gain 10 pounds, continue with heart healthy changes, meet energy and protein needs    Comment Addisynn is doing well with her diet. She is staying away from her salt.  She has switched to soy milk. She is also staying away from fired foods.  She surprisingly likes eating fish and using her crockpot during the week.  She continues to work on variety in her fruits and vegetables.    Expected Outcome Short; Continue to use crockpot  Long; Contiue to follow heart healhty guidelines             Psychosocial: Target Goals: Acknowledge presence or absence of significant depression and/or stress, maximize coping skills, provide positive support system. Participant is able to verbalize types and ability to use techniques and skills needed for reducing stress and depression.   Education: Stress, Anxiety, and Depression - Group verbal and visual presentation to define topics covered.  Reviews how body is impacted by stress, anxiety, and depression.  Also discusses healthy ways to reduce stress and to treat/manage anxiety and depression.  Written material given at graduation. Flowsheet Row Cardiac Rehab from 01/06/2022 in [facility] Cardiac and Pulmonary Rehab  Date 11/04/21  Educator [facility]  Instruction Review Code 1- [employer] Understanding       Education: Sleep  Hygiene -Provides group verbal and written instruction about how sleep can affect your health.  Define sleep hygiene, discuss sleep cycles and impact of sleep habits. Review good sleep hygiene tips.    Initial Review & Psychosocial Screening:  Initial Psych Review & Screening - 09/29/21 1540       Initial Review   Current issues with Current Stress Concerns;Current Sleep Concerns    Source of Stress Concerns Chronic Illness      Family Dynamics   Good Support System? Yes   family     Barriers   Psychosocial barriers to participate in program There are no identifiable barriers or psychosocial needs.;The patient should benefit from training in stress management and relaxation.      Screening Interventions   Interventions Encouraged to exercise;Provide feedback about the scores to participant;To provide support and resources with identified psychosocial needs    Expected Outcomes Short Term goal: Utilizing psychosocial counselor, staff and physician to assist with identification of specific Stressors or current issues interfering with healing process. Setting desired goal for each stressor or current issue identified.;Long Term Goal: Stressors or current issues are controlled or eliminated.;Short Term goal: Identification and review with participant of any Quality of Life or Depression concerns found by scoring the questionnaire.;Long Term goal: The participant improves quality of Life and PHQ9 Scores as seen by post scores and/or verbalization of changes             Quality of Life Scores:   Quality of Life - 01/06/22 1129       Quality of Life   Select Quality of Life      Quality of Life Scores   Health/Function Pre 22.7 %    Health/Function Post 30 %    Health/Function % Change 32.16 %    Socioeconomic Pre 26.14 %    Socioeconomic Post 30 %    Socioeconomic % Change  14.77 %    Psych/Spiritual Pre 24.86 %  Psych/Spiritual Post 30 %    Psych/Spiritual % Change 20.68 %     Family Pre 25.2 %    Family Post 30 %    Family % Change 19.05 %    GLOBAL Pre 24.22 %    GLOBAL Post 30 %    GLOBAL % Change 23.86 %            Scores of 19 and below usually indicate a poorer quality of life in these areas.  A difference of  2-3 points is a clinically meaningful difference.  A difference of 2-3 points in the total score of the Quality of Life Index has been associated with significant improvement in overall quality of life, self-image, physical symptoms, and general health in studies assessing change in quality of life.  PHQ-9: Review Flowsheet  More data may exist      01/06/2022 11/16/2021 11/09/2021 10/14/2021 10/11/2021  Depression screen PHQ 2/9  Decreased Interest 0 0 0 0 3  Down, Depressed, Hopeless 0 0 0 1 1  PHQ - 2 Score 0 0 0 1 4  Altered sleeping 0 1 1 - 1  Tired, decreased energy 0 0 1 - 1  Change in appetite 0 0 0 - 1  Feeling bad or failure about yourself  0 0 0 - 0  Trouble concentrating 0 1 1 - 1  Moving slowly or fidgety/restless 0 0 0 - 1  Suicidal thoughts 0 0 0 - 0  PHQ-9 Score 0 2 3 - 9  Difficult doing work/chores - - Somewhat difficult - Somewhat difficult   Interpretation of Total Score  Total Score Depression Severity:  1-4 = Minimal depression, 5-9 = Mild depression, 10-14 = Moderate depression, 15-19 = Moderately severe depression, 20-27 = Severe depression   Psychosocial Evaluation and Intervention:  Psychosocial Evaluation - 09/29/21 1549       Psychosocial Evaluation & Interventions   Interventions Encouraged to exercise with the program and follow exercise prescription;Stress management education;Relaxation education    Comments Ms. Saline was surprised her heart was having issues and is really wanting to gain more knowledge about her heart failure diagnosis. She states she has a history of bronchitis and asthma, but never thought her heart would be giving her issues. She has changed her diet and is starting to exercise  more. Her sleep has been a struggle for a while, in that she wakes up early morning and has a hard time falling back asleep. She has talked with her doctor about this and is trying to still lay there and rest to help her heart. Her family is involved in her care.    Expected Outcomes Short: attend cardiac rehab for education. Long: develop and maintain positive self care habits.    Continue Psychosocial Services  Follow up required by staff             Psychosocial Re-Evaluation:  Psychosocial Re-Evaluation     [place] Name 10/28/21 1115 11/18/21 1425 12/23/21 1111         Psychosocial Re-Evaluation   Current issues with Current Stress Concerns;Current Sleep Concerns Current Stress Concerns;Current Sleep Concerns Current Stress Concerns;Current Sleep Concerns     Comments Teka  is doing well in rehab.  Her biggest stressor is her health. She does wake up around 3-4am for an hour and then finally back to sleep.  She has talked to her doctor about it and they want to do a sleep study with her.  Her sister  that  she was helping to feed has now reached the point for Hospice.  She is 80 and not a candidate for dialysis.  She is unable to care for herself.  They all take turns watching her and helping.  Dreama is adjusting to knowing about her heart failure.  She wants to work to get her EF back up and get better to keep going.  She is scheduled for an MRI in January and considering an ICD. Quanna is still struggling with sleep. She has yet to schedule her sleep study and was encouraged to call again. Her doctor told her to try Melatonin extended release versus regular to see if that helps too. She is still caring for her sister at her assisted home which can be stressful sometimes for her. She has been enjoying the program so far and hopes to keep up her strength and improve her EF. Christianne is doing well in rehab.  She has picked back up her day to go feed her sister! She is happy to be able to  help again.  She got a good report from heart failure clinic this morning.  She is sleeping well again. She has started taking melatonin earilier in evening to help.  She will talk to doctor about sleep study in January.   She also got good news that she will not have a copay going into new year so she will be able to continue to attend rehab.     Expected Outcomes Short: Get sleep study Long: Continue to exericse to for mental boost Short: Schedule sleep study, talk to doctor about options to help with sleep Long: Continue to maintain positive attitude and utilize exercise for stress management Short: Continue to use melatonin to help sleep Long: Continue to stay positive     Interventions Encouraged to attend Cardiac Rehabilitation for the exercise Encouraged to attend Cardiac Rehabilitation for the exercise Encouraged to attend Cardiac Rehabilitation for the exercise     Continue Psychosocial Services  Follow up required by staff Follow up required by staff Follow up required by staff              Psychosocial Discharge (Final Psychosocial Re-Evaluation):  Psychosocial Re-Evaluation - 12/23/21 1111       Psychosocial Re-Evaluation   Current issues with Current Stress Concerns;Current Sleep Concerns    Comments Wendy is doing well in rehab.  She has picked back up her day to go feed her sister! She is happy to be able to help again.  She got a good report from heart failure clinic this morning.  She is sleeping well again. She has started taking melatonin earilier in evening to help.  She will talk to doctor about sleep study in January.   She also got good news that she will not have a copay going into new year so she will be able to continue to attend rehab.    Expected Outcomes Short: Continue to use melatonin to help sleep Long: Continue to stay positive    Interventions Encouraged to attend Cardiac Rehabilitation for the exercise    Continue Psychosocial Services  Follow up required by  staff             Vocational Rehabilitation: Provide vocational rehab assistance to qualifying candidates.   Vocational Rehab Evaluation & Intervention:  Vocational Rehab - 09/29/21 1540       Initial Vocational Rehab Evaluation & Intervention   Assessment shows need for Vocational Rehabilitation No  Education: Education Goals: Education classes will be provided on a variety of topics geared toward better understanding of heart health and risk factor modification. Participant will state understanding/return demonstration of topics presented as noted by education test scores.  Learning Barriers/Preferences:  Learning Barriers/Preferences - 09/29/21 1538       Learning Barriers/Preferences   Learning Barriers None    Learning Preferences Individual Instruction             General Cardiac Education Topics:  AED/CPR: - Group verbal and written instruction with the use of models to demonstrate the basic use of the AED with the basic ABC's of resuscitation.   Anatomy and Cardiac Procedures: - Group verbal and visual presentation and models provide information about basic cardiac anatomy and function. Reviews the testing methods done to diagnose heart disease and the outcomes of the test results. Describes the treatment choices: Medical Management, Angioplasty, or Coronary Bypass Surgery for treating various heart conditions including Myocardial Infarction, Angina, Valve Disease, and Cardiac Arrhythmias.  Written material given at graduation. Flowsheet Row Cardiac Rehab from 01/06/2022 in [facility] Cardiac and Pulmonary Rehab  Date 12/16/21  Educator SB  Instruction Review Code 1- Verbalizes Understanding       Medication Safety: - Group verbal and visual instruction to review commonly prescribed medications for heart and lung disease. Reviews the medication, class of the drug, and side effects. Includes the steps to properly store meds and maintain the  prescription regimen.  Written material given at graduation. Flowsheet Row Cardiac Rehab from 01/06/2022 in [facility] Cardiac and Pulmonary Rehab  Date 12/23/21  Educator SB  Instruction Review Code 1- Verbalizes Understanding       Intimacy: - Group verbal instruction through game format to discuss how heart and lung disease can affect sexual intimacy. Written material given at graduation.. Flowsheet Row Cardiac Rehab from 01/06/2022 in [facility] Cardiac and Pulmonary Rehab  Date 11/18/21  Educator [facility]  Instruction Review Code 1- Verbalizes Understanding       Know Your Numbers and Heart Failure: - Group verbal and visual instruction to discuss disease risk factors for cardiac and pulmonary disease and treatment options.  Reviews associated critical values for Overweight/Obesity, Hypertension, Cholesterol, and Diabetes.  Discusses basics of heart failure: signs/symptoms and treatments.  Introduces Heart Failure Zone chart for action plan for heart failure.  Written material given at graduation. Flowsheet Row Cardiac Rehab from 01/06/2022 in [facility] Cardiac and Pulmonary Rehab  Date 12/30/21  Educator SB  Instruction Review Code 1- Verbalizes Understanding       Infection Prevention: - Provides verbal and written material to individual with discussion of infection control including proper hand washing and proper equipment cleaning during exercise session. Flowsheet Row Cardiac Rehab from 01/06/2022 in [facility] Cardiac and Pulmonary Rehab  Date 10/11/21  Educator [facility]  Instruction Review Code 1- Verbalizes Understanding       Falls Prevention: - Provides verbal and written material to individual with discussion of falls prevention and safety. Flowsheet Row Cardiac Rehab from 01/06/2022 in [facility] Cardiac and Pulmonary Rehab  Date 10/11/21  Educator [facility]  Instruction Review Code 1- Verbalizes Understanding       Other: -Provides group and verbal instruction on various topics (see  comments)   Knowledge Questionnaire Score:  Knowledge Questionnaire Score - 01/06/22 1129       Knowledge Questionnaire Score   Post Score 25/26             Core Components/Risk Factors/Patient Goals at Admission:  Personal  Goals and Risk Factors at Admission - 10/11/21 1525       Core Components/Risk Factors/Patient Goals on Admission    Weight Management Yes    Intervention Weight Management: Develop a combined nutrition and exercise program designed to reach desired caloric intake, while maintaining appropriate intake of nutrient and fiber, sodium and fats, and appropriate energy expenditure required for the weight goal.;Weight Management: Provide education and appropriate resources to help participant work on and attain dietary goals.    Admit Weight 121 lb 1.6 oz (54.9 kg)    Goal Weight: Short Term 125 lb (56.7 kg)    Goal Weight: Long Term 130 lb (59 kg)    Expected Outcomes Short Term: Continue to assess and modify interventions until short term weight is achieved;Long Term: Adherence to nutrition and physical activity/exercise program aimed toward attainment of established weight goal;Understanding recommendations for meals to include 15-35% energy as protein, 25-35% energy from fat, 35-60% energy from carbohydrates, less than 225m of dietary cholesterol, 20-35 gm of total fiber daily;Understanding of distribution of calorie intake throughout the day with the consumption of 4-5 meals/snacks;Weight Gain: Understanding of general recommendations for a high calorie, high protein meal plan that promotes weight gain by distributing calorie intake throughout the day with the consumption for 4-5 meals, snacks, and/or supplements    Heart Failure Yes    Intervention Provide a combined exercise and nutrition program that is supplemented with education, support and counseling about heart failure. Directed toward relieving symptoms such as shortness of breath, decreased exercise tolerance,  and extremity edema.    Expected Outcomes Improve functional capacity of life;Short term: Attendance in program 2-3 days a week with increased exercise capacity. Reported lower sodium intake. Reported increased fruit and vegetable intake. Reports medication compliance.;Short term: Daily weights obtained and reported for increase. Utilizing diuretic protocols set by physician.;Long term: Adoption of self-care skills and reduction of barriers for early signs and symptoms recognition and intervention leading to self-care maintenance.    Hypertension Yes    Intervention Provide education on lifestyle modifcations including regular physical activity/exercise, weight management, moderate sodium restriction and increased consumption of fresh fruit, vegetables, and low fat dairy, alcohol moderation, and smoking cessation.;Monitor prescription use compliance.    Expected Outcomes Short Term: Continued assessment and intervention until BP is < 140/976mHG in hypertensive participants. < 130/8029mG in hypertensive participants with diabetes, heart failure or chronic kidney disease.;Long Term: Maintenance of blood pressure at goal levels.    Lipids Yes    Intervention Provide education and support for participant on nutrition & aerobic/resistive exercise along with prescribed medications to achieve LDL <90m60mDL >40mg27m Expected Outcomes Short Term: Participant states understanding of desired cholesterol values and is compliant with medications prescribed. Participant is following exercise prescription and nutrition guidelines.;Long Term: Cholesterol controlled with medications as prescribed, with individualized exercise RX and with personalized nutrition plan. Value goals: LDL < 90mg,57m > 40 mg.             Education:Diabetes - Individual verbal and written instruction to review signs/symptoms of diabetes, desired ranges of glucose level fasting, after meals and with exercise. Acknowledge that pre and  post exercise glucose checks will be done for 3 sessions at entry of program.   Core Components/Risk Factors/Patient Goals Review:   Goals and Risk Factor Review     Row Name 10/28/21 1126 11/18/21 1428 12/23/21 1106         Core Components/Risk Factors/Patient Goals Review  Personal Goals Review Weight Management/Obesity;Heart Failure;Hypertension;Lipids Weight Management/Obesity;Heart Failure;Hypertension Weight Management/Obesity;Heart Failure;Hypertension     Review Delmy is doing well in rehab.  She has started to gain again and up 2 lb.  Her blood pressures and blood sugars are doing well and she checks them at home routinely.  She denies any heart failure symptoms.  She will have her echo in January for ICD consideration.  We talked about what the ICD looks like and what it does for her.  She is doing well on her medications. Channing continues to do well. She is trying to gain 10 lbs in total as her weight goal and is trying to change her diet to gear more towards heart healthy but also enough to promote weight gain. She is incorporating more protein in her diet. She denies any symptoms with heart failure and is aware to make note of any sudden weight gain if she sees any on the scale. She is anxious to see how her EF is improving with exercise and will be re-evaluated for an ICD in January, pending on her test results. All in all, she checks her BP almost everyday and stays stable, anywhere between 110-120s/ 60-70s. Ninfa is doing well in rehab. She just had an appointment  with Heart Failure clinic and got a good report.  They are pleased with her progress. She is able to continue attend rehab without a copay going into the new year.  Her pressures are doing well.  Her weight is staying steady.  She is scheduled for her cardiac MRI on January 2 and then they will make the decision about ICD. Her follow up appt will be Jan 29.  She also has her annual PCP appt in January.     Expected  Outcomes Short: Continue to work on weight gain Long: Continue to monitor risk factors. Short: Continue to monitor weight Long: Hit long-term weight gain goal of 10+ lb. Short: Continue to monitor heart failure Long: Continue to monitor risk factors.              Core Components/Risk Factors/Patient Goals at Discharge (Final Review):   Goals and Risk Factor Review - 12/23/21 1106       Core Components/Risk Factors/Patient Goals Review   Personal Goals Review Weight Management/Obesity;Heart Failure;Hypertension    Review Idora is doing well in rehab. She just had an appointment  with Heart Failure clinic and got a good report.  They are pleased with her progress. She is able to continue attend rehab without a copay going into the new year.  Her pressures are doing well.  Her weight is staying steady.  She is scheduled for her cardiac MRI on January 2 and then they will make the decision about ICD. Her follow up appt will be Jan 29.  She also has her annual PCP appt in January.    Expected Outcomes Short: Continue to monitor heart failure Long: Continue to monitor risk factors.             ITP Comments:  ITP Comments     Row Name 09/29/21 1539 10/11/21 1514 10/12/21 1116 10/13/21 0704 11/10/21 0920   ITP Comments Initial telephone orientation completed. Diagnosis can be found in [facility] 8/16. EP orientation scheduled for Monday 10/2 at 1:30pm. Completed 6MWT and gym orientation. Initial ITP created and sent for review to Dr. Emily Filbert, Medical Director. First full day of exercise!  Patient was oriented to gym and equipment including functions, settings, policies, and  procedures.  Patient's individual exercise prescription and treatment plan were reviewed.  All starting workloads were established based on the results of the 6 minute walk test done at initial orientation visit.  The plan for exercise progression was also introduced and progression will be customized based on patient's  performance and goals 30 Day review completed. Medical Director ITP review done, changes made as directed, and signed approval by Medical Director.   New to program 30 Day review completed. Medical Director ITP review done, changes made as directed, and signed approval by Medical Director.    [place] Name 12/08/21 0752 01/05/22 1005 01/06/22 1133       ITP Comments 30 Day review completed. Medical Director ITP review done, changes made as directed, and signed approval by Medical Director. 30 Day review completed. Medical Director ITP review done, changes made as directed, and signed approval by Medical Director. Renalda graduated today from  rehab with 36 sessions completed.  Details of the patient's exercise prescription and what She needs to do in order to continue the prescription and progress were discussed with patient.  Patient was given a copy of prescription and goals.  Patient verbalized understanding.  Breindy plans to continue to exercise by walking.              Comments: Discharge ITP

## 2022-01-19 ENCOUNTER — Other Ambulatory Visit: Payer: Self-pay | Admitting: Internal Medicine

## 2022-01-19 DIAGNOSIS — Z1231 Encounter for screening mammogram for malignant neoplasm of breast: Secondary | ICD-10-CM

## 2022-02-09 ENCOUNTER — Ambulatory Visit
Admission: RE | Admit: 2022-02-09 | Discharge: 2022-02-09 | Disposition: A | Discharge status: Home / Self Care | Payer: Medicare Other | Source: Ambulatory Visit | Attending: Internal Medicine | Admitting: Internal Medicine

## 2022-02-09 DIAGNOSIS — Z1231 Encounter for screening mammogram for malignant neoplasm of breast: Secondary | ICD-10-CM | POA: Diagnosis present

## 2022-03-29 DIAGNOSIS — Z95810 Presence of automatic (implantable) cardiac defibrillator: Secondary | ICD-10-CM

## 2022-03-29 HISTORY — DX: Presence of automatic (implantable) cardiac defibrillator: Z95.810

## 2022-05-06 IMAGING — MG MM DIGITAL SCREENING BILAT W/ TOMO AND CAD
8 series · 9 of 24 positions shown · non-contrast
Comparison: Previous exam(s).

CLINICAL DATA: Screening.

EXAM:
DIGITAL SCREENING BILATERAL MAMMOGRAM WITH TOMOSYNTHESIS AND CAD
TECHNIQUE: Bilateral screening digital craniocaudal and mediolateral oblique
mammograms were obtained. Bilateral screening digital breast
tomosynthesis was performed. The images were evaluated with
computer-aided detection.

[R MLO synth-2D]
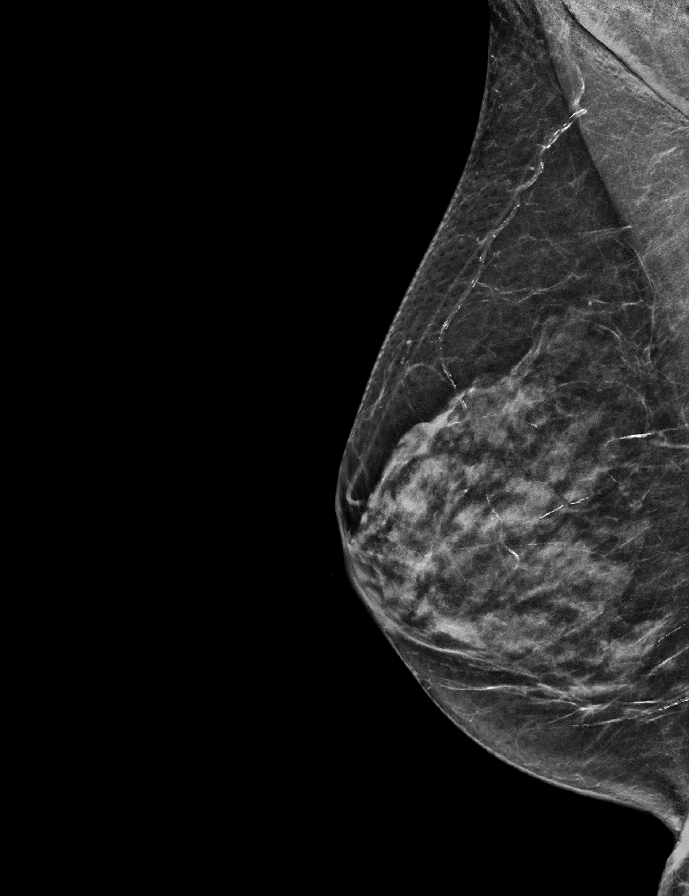

[L MLO synth-2D]
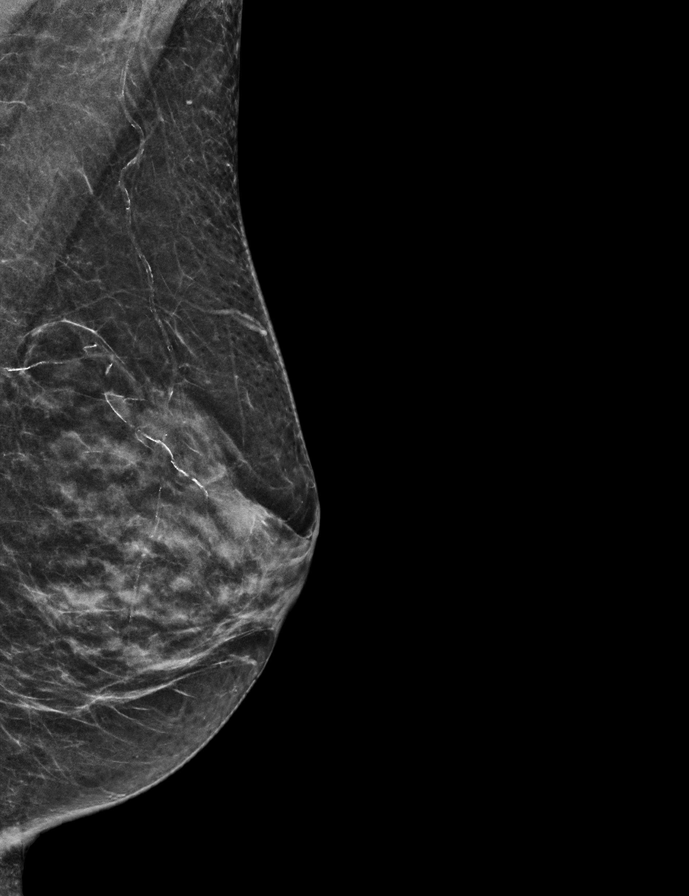

[R CC synth-2D]
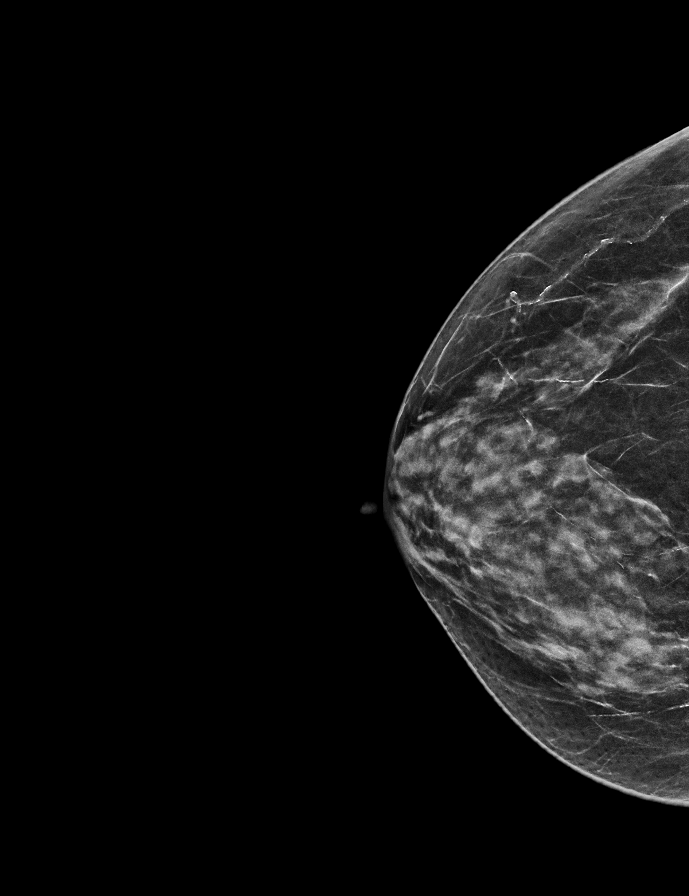

[L CC synth-2D]
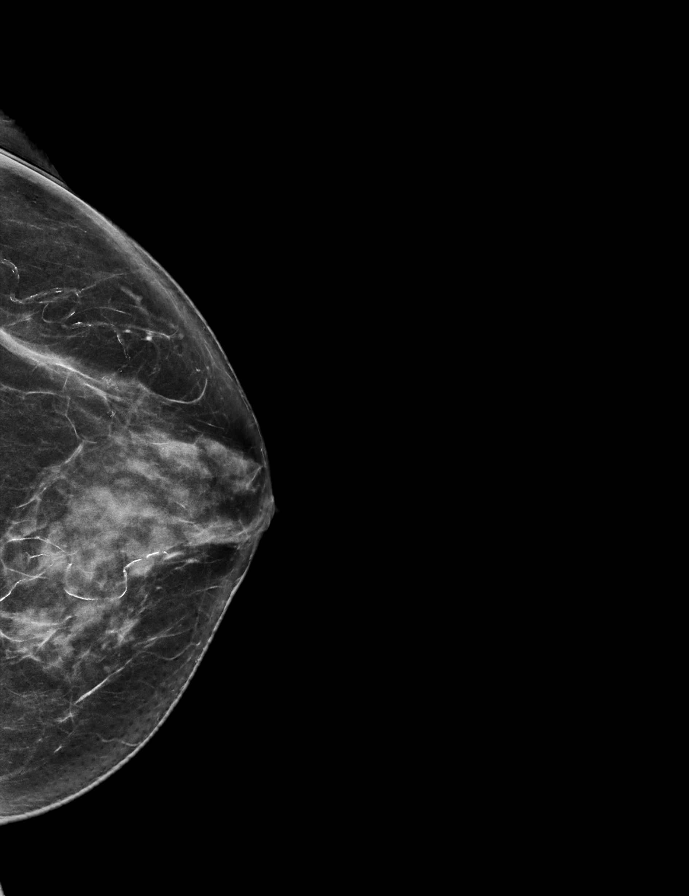

[R CC tomo · 2 of 54 frames shown]
[frame 18/54]
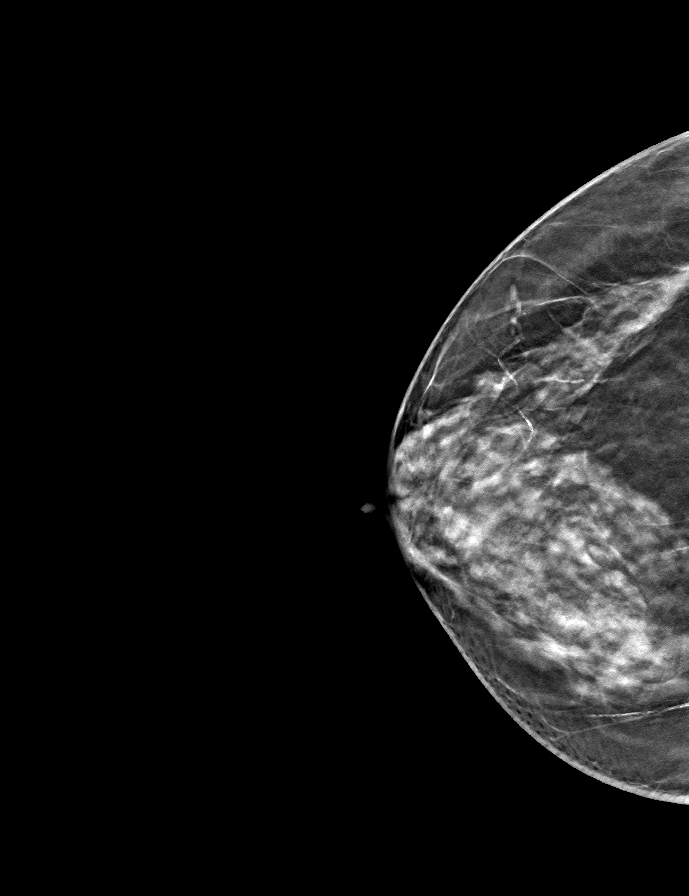
[frame 27/54]
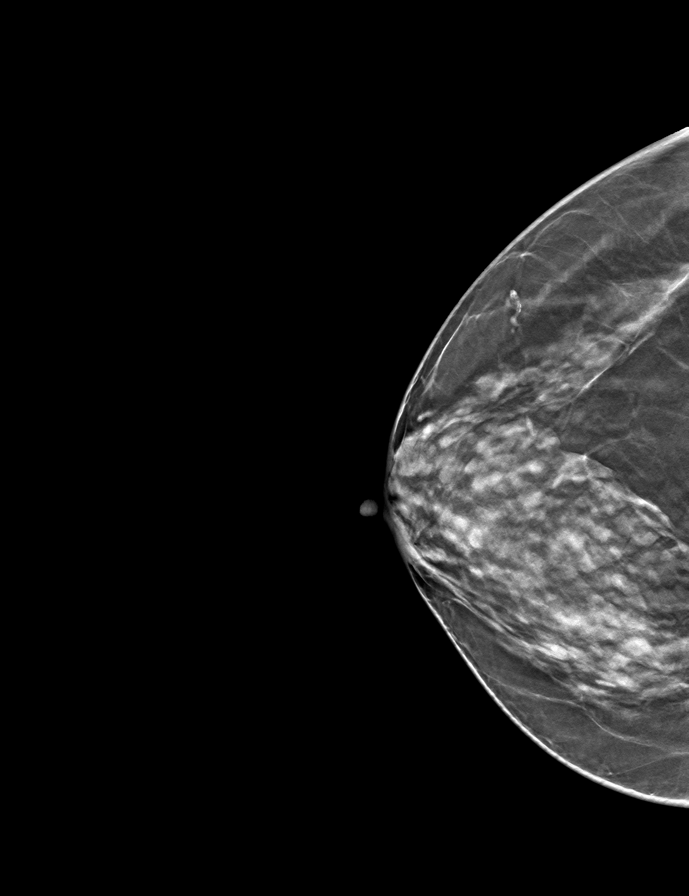

[L CC tomo · tomo slice 32/63.0]
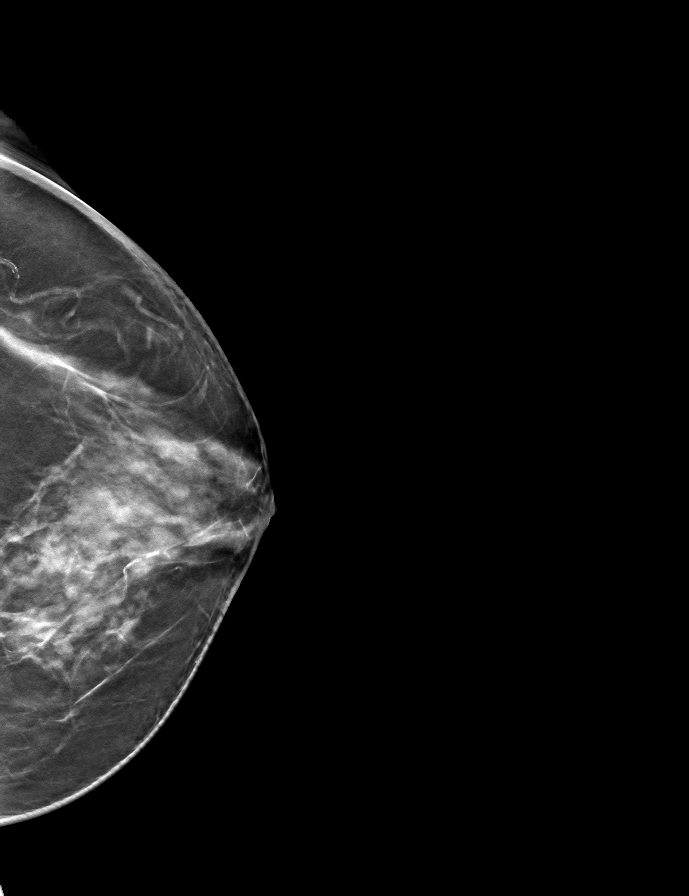

[R MLO tomo · tomo slice 28/55.0]
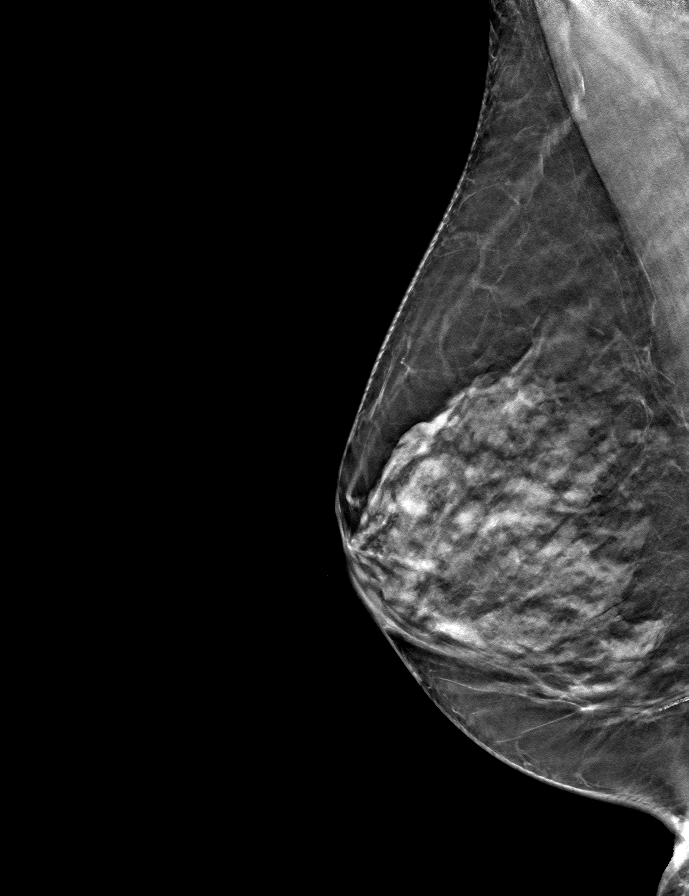

[L MLO tomo · tomo slice 27/52.0]
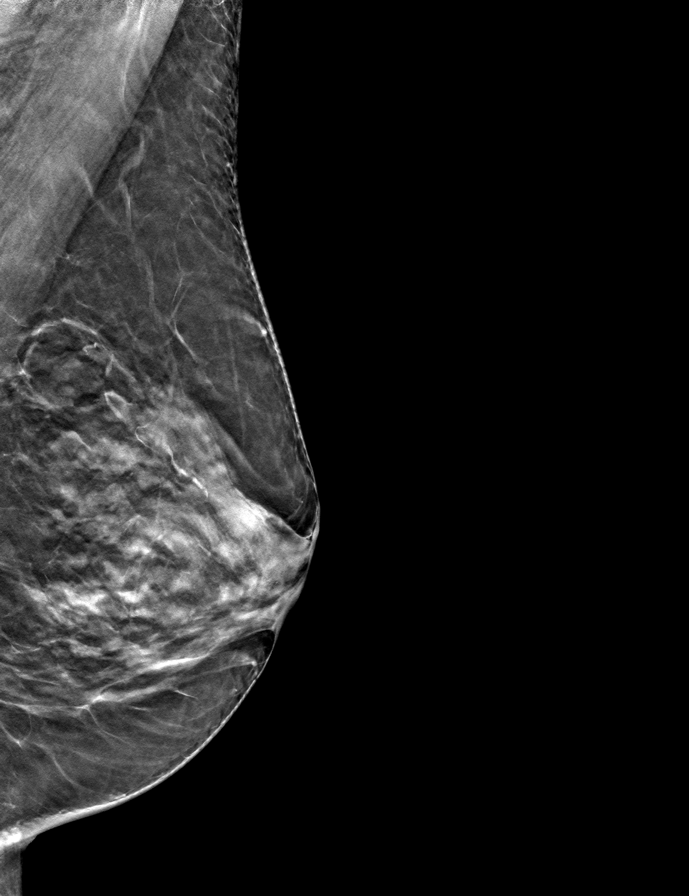

[9 of 24 positions shown; findings below may reference images not displayed]

ACR Breast Density Category c: The breast tissue is heterogeneously
dense, which may obscure small masses.
FINDINGS: There are no findings suspicious for malignancy.
IMPRESSION: No mammographic evidence of malignancy. A result letter of this
screening mammogram will be mailed directly to the patient.

RECOMMENDATION:
Screening mammogram in one year. (Code:[id number]-BC3)

BI-RADS CATEGORY  1: Negative.

## 2022-06-22 ENCOUNTER — Telehealth: Payer: Self-pay

## 2022-06-22 ENCOUNTER — Other Ambulatory Visit (HOSPITAL_COMMUNITY): Payer: Self-pay

## 2022-06-22 NOTE — Progress Notes (Signed)
Pt called stating she is in the donut hole and cannot get her farxiga medication. Notified pt that we could give her a month supply of samples and one of the pharmacist would reach out to her to start a patient assistance application for the medication.   Reached out to [employer], CPhT to assist pt.

## 2022-07-04 ENCOUNTER — Other Ambulatory Visit (HOSPITAL_COMMUNITY): Payer: Self-pay

## 2022-07-06 ENCOUNTER — Telehealth (HOSPITAL_COMMUNITY): Payer: Self-pay

## 2022-07-06 NOTE — Telephone Encounter (Signed)
Advanced Heart Failure Patient Advocate Encounter  Patient may be eligible for a grant that is currently open and would cover the cost of Farxiga, Metoprolol, Spironolactone. Attempted to contact patient to verify address and income information, no answer at this time. Left voicemail, will try back.  Burnell Blanks, CPhT Rx Patient Advocate Phone: [phone]

## 2022-07-12 NOTE — Telephone Encounter (Signed)
Second attempt to reach patient about medication assistance. No answer, left voicemail.

## 2022-07-15 ENCOUNTER — Other Ambulatory Visit (HOSPITAL_COMMUNITY): Payer: Self-pay

## 2022-07-15 NOTE — Telephone Encounter (Signed)
Advanced Heart Failure Patient Advocate Encounter  The patient was approved for a [employer] grant that will help cover the cost of Farxiga, Metoprolol, Spironolactone.  Total amount awarded, $10,000.  Effective: 06/15/2022 - 06/14/2023.  BIN [id number] PCN [id number] [id number] ID [id number]  Pharmacy provided with approval and processing information. Successful claim, pharmacy has a refund available for the patient. Patient informed via phone.  Burnell Blanks, CPhT Rx Patient Advocate Phone: [phone]

## 2022-10-17 ENCOUNTER — Other Ambulatory Visit: Payer: Self-pay

## 2022-10-17 ENCOUNTER — Emergency Department
Admission: EM | Admit: 2022-10-17 | Discharge: 2022-10-17 | Disposition: A | Discharge status: Home / Self Care | Payer: Medicare Other | Attending: Emergency Medicine | Admitting: Emergency Medicine

## 2022-10-17 DIAGNOSIS — R1110 Vomiting, unspecified: Secondary | ICD-10-CM | POA: Diagnosis not present

## 2022-10-17 DIAGNOSIS — I110 Hypertensive heart disease with heart failure: Secondary | ICD-10-CM | POA: Diagnosis not present

## 2022-10-17 DIAGNOSIS — E876 Hypokalemia: Secondary | ICD-10-CM | POA: Insufficient documentation

## 2022-10-17 DIAGNOSIS — Z950 Presence of cardiac pacemaker: Secondary | ICD-10-CM | POA: Diagnosis not present

## 2022-10-17 DIAGNOSIS — R42 Dizziness and giddiness: Secondary | ICD-10-CM | POA: Diagnosis present

## 2022-10-17 DIAGNOSIS — I509 Heart failure, unspecified: Secondary | ICD-10-CM | POA: Insufficient documentation

## 2022-10-17 LAB — BASIC METABOLIC PANEL
Anion gap: 7 (ref 5–15)
BUN: 16 mg/dL (ref 8–23)
CO2: 23 mmol/L (ref 22–32)
Calcium: 7.6 mg/dL — ABNORMAL LOW (ref 8.9–10.3)
Chloride: 108 mmol/L (ref 98–111)
Creatinine, Ser: 0.83 mg/dL (ref 0.44–1.00)
GFR, Estimated: >60 mL/min (ref >60)
Glucose, Bld: 120 mg/dL — ABNORMAL HIGH (ref 70–99)
Potassium: 3.1 mmol/L — ABNORMAL LOW (ref 3.5–5.1)
Sodium: 138 mmol/L (ref 135–145)

## 2022-10-17 LAB — CBC
HCT: 35.2 % — ABNORMAL LOW (ref 36.0–46.0)
Hemoglobin: 11.6 g/dL — ABNORMAL LOW (ref 12.0–15.0)
MCH: 30.9 pg (ref 26.0–34.0)
MCHC: 33 g/dL (ref 30.0–36.0)
MCV: 93.6 fL (ref 80.0–100.0)
Platelets: 147 10*3/uL — ABNORMAL LOW (ref 150–400)
RBC: 3.76 MIL/uL — ABNORMAL LOW (ref 3.87–5.11)
RDW: 12.6 % (ref 11.5–15.5)
WBC: 4.8 10*3/uL (ref 4.0–10.5)
nRBC: 0 % (ref 0.0–0.2)

## 2022-10-17 LAB — MAGNESIUM: Magnesium: 2.1 mg/dL (ref 1.7–2.4)

## 2022-10-17 MED ORDER — DROPERIDOL 2.5 MG/ML IJ SOLN
1.2500 mg | Freq: Once | INTRAMUSCULAR | Status: AC
  Administered 2022-10-17: 1.25 mg via INTRAVENOUS
  Filled 2022-10-17: qty 2

## 2022-10-17 MED ORDER — MECLIZINE HCL 25 MG PO TABS
12.5000 mg | ORAL_TABLET | Freq: Once | ORAL | Status: AC
  Administered 2022-10-17: 12.5 mg via ORAL
  Filled 2022-10-17: qty 1

## 2022-10-17 MED ORDER — MECLIZINE HCL 12.5 MG PO TABS
12.5000 mg | ORAL_TABLET | Freq: Three times a day (TID) | ORAL | 0 refills | Status: AC | PRN
Start: 2022-10-17 — End: ?

## 2022-10-17 MED ORDER — CALCIUM GLUCONATE-NACL 1-0.675 GM/50ML-% IV SOLN: 1.0000 g | Freq: Once | INTRAVENOUS | Status: DC

## 2022-10-17 MED ORDER — CALCIUM CARBONATE ANTACID 500 MG PO CHEW
800.0000 mg | CHEWABLE_TABLET | Freq: Once | ORAL | Status: AC
  Administered 2022-10-17: 800 mg via ORAL
  Filled 2022-10-17: qty 4

## 2022-10-17 MED ORDER — POTASSIUM CHLORIDE 20 MEQ PO PACK
40.0000 meq | PACK | ORAL | Status: AC
  Administered 2022-10-17: 40 meq via ORAL
  Filled 2022-10-17: qty 2

## 2022-10-17 NOTE — ED Triage Notes (Signed)
Patient to arrive to the ER from home via [facility] c/o nausea, vomiting, and "swimmy headed."  Hx Vertigo, AV paced pacemaker x 2 mo ago, and HTN.  EMS states that patient received 4 mg zofran in route, 20g left hand. Originally pressure 160/80, last pressure with EMS 140/64.  CBG with EMS 132.

## 2022-10-17 NOTE — ED Provider Notes (Signed)
[facility] Provider Note    Event Date/Time   First MD Initiated Contact with Patient 10/17/22 [phone]     (approximate)   History   Dizziness and Vomiting   HPI Glenda May is a 77 y.o. female whose prior medical history includes dilated cardiomyopathy (CHF) with a pacemaker, essential hypertension, and prior episodes of vertigo.  She presents by EMS tonight for nausea and vomiting and feeling "swimmy headed".  She reports that she felt completely normal when she went to bed tonight, but when she woke up to go to the bathroom she felt nauseated and like the room was spinning and "swimmy headed".  She states that this is the same as she felt in the past when she was having trouble with vertigo.  She is having no pain.  She denies any weakness or numbness in her extremities.  She has had no difficulty expressing herself or understanding other people.  No visual disturbances.     Physical Exam   Triage Vital Signs: ED Triage Vitals  Encounter Vitals Group     BP 10/17/22 0334 (!) 154/64     Systolic BP Percentile --      Diastolic BP Percentile --      Pulse Rate 10/17/22 0334 80     Resp 10/17/22 0334 18     Temp 10/17/22 0334 97.6 F (36.4 C)     Temp Source 10/17/22 0334 Oral     SpO2 10/17/22 0334 100 %     Weight 10/17/22 0335 54.4 kg (120 lb)     Height 10/17/22 0335 1.575 m (5\' 2" )     Head Circumference --      Peak Flow --      Pain Score 10/17/22 0336 5     Pain Loc --      Pain Education --      Exclude from Growth Chart --     Most recent vital signs: Vitals:   10/17/22 0530 10/17/22 0600  BP: (!) 129/59 (!) 133/58  Pulse: (!) 38 82  Resp: (!) 24 17  Temp:    SpO2: 99% 96%    General: Awake, alert, holding an emesis bag to her mouth but not actively vomiting. Eyes:  Pupils are equal and reactive.  No nystagmus.  Normal extraocular movement. CV:  Good peripheral perfusion.  Regular rate and rhythm.  Pacemaker present in the  left upper chest. Resp:  Normal effort. Speaking easily and comfortably, no accessory muscle usage nor intercostal retractions.   Abd:  No distention.  Other:  No focal neurological deficits appreciated.  Patient has normal grip strength bilaterally, normal arm muscle strength, able to move her legs equally, no dysarthria nor aphasia, and no dysmetria on finger-to-nose testing.   ED Results / Procedures / Treatments   Labs (all labs ordered are listed, but only abnormal results are displayed) Labs Reviewed  BASIC METABOLIC PANEL - Abnormal; Notable for the following components:      Result Value   Potassium 3.1 (*)    Glucose, Bld 120 (*)    Calcium 7.6 (*)    All other components within normal limits  CBC - Abnormal; Notable for the following components:   RBC 3.76 (*)    Hemoglobin 11.6 (*)    HCT 35.2 (*)    Platelets 147 (*)    All other components within normal limits  MAGNESIUM     EKG  ED ECG REPORT I, Loleta Rose, the  attending physician, personally viewed and interpreted this ECG.  Date: 10/17/2022 EKG Time: 3:39 AM Rate: 81 Rhythm: normal sinus rhythm QRS Axis: normal Intervals: normal with WPW pattern (delta waves) ST/T Wave abnormalities: Non-specific ST segment / T-wave changes, but no clear evidence of acute ischemia. Narrative Interpretation: no definitive evidence of acute ischemia; does not meet STEMI criteria.     PROCEDURES:  Critical Care performed: No  Procedures    IMPRESSION / MDM / ASSESSMENT AND PLAN / ED COURSE  I reviewed the triage vital signs and the nursing notes.                              Differential diagnosis includes, but is not limited to, peripheral vertigo, central vertigo including hemorrhagic or ischemic CVA, electrolyte or metabolic abnormality, renal failure, medication or drug side effect.  Patient's presentation is most consistent with acute presentation with potential threat to life or bodily  function.  Labs/studies ordered: CBC, magnesium, basic metabolic panel, EKG  Interventions/Medications given:  Medications  meclizine (ANTIVERT) tablet 12.5 mg (12.5 mg Oral Given 10/17/22 0346)  droperidol (INAPSINE) 2.5 MG/ML injection 1.25 mg (1.25 mg Intravenous Given 10/17/22 0358)  potassium chloride (KLOR-CON) packet 40 mEq (40 mEq Oral Given 10/17/22 0559)  calcium carbonate (TUMS - dosed in mg elemental calcium) chewable tablet 800 mg of elemental calcium (800 mg of elemental calcium Oral Given 10/17/22 0559)    (Note:  hospital course my include additional interventions and/or labs/studies not listed above.)   Patient received Zofran prior to arrival by EMS and she says she is feeling better.  No longer actively vomiting.  No appreciable focal neurological deficits.  Stable vitals, normal mental status.  Similar prior episodes of vertigo.  No history of trauma and no pain.  I will hold off on any imaging because a CT without contrast would likely be of no utility and she is not eligible for an MRI due to the presence of her pacemaker.  I will check basic labs and give her a small dose of meclizine which normally I would avoid in her age but may be the best way to treat her vertigo.  I will reassess after treatment and after the lab work to look for any evidence of metabolic or electrolyte abnormalities.  No indication to check a troponin since she is not having any chest pain and this very much does not seem like an issue of ACS.  The patient is on the cardiac monitor to evaluate for evidence of arrhythmia and/or significant heart rate changes.   Clinical Course as of 10/17/22 0709  Mon Oct 17, 2022  1610 Patient vomited again just after getting meclizine by mouth.  I considered her cardiac situation including her EKG which shows a prolonged QTc interval.  However, I verified in the medical record that she has an implanted cardiac defibrillator/pacemaker.  I think it is very reasonable  and appropriate to give her a small dose of droperidol 1.25 mg IV which may be very effective in treating her vertigo and has a very low risk of cardiac arrhythmia despite her prolonged QTc, and she already has a defibrillator in place and the extremely unlikely event that she develops an arrhythmia as a result of the droperidol. [CF]  0408 CBC(!) Reassuring CBC, no significant anemia, no leukocytosis [CF]  0518 I reassessed the patient.  She is awake and alert and says she is feeling a lot better.  She said that she still feels like the room is moving a little bit but she no longer feels nauseated and feels much better than before.  She continues to have no neurological deficits.  Again I feel that there is no benefit to getting a CT of the head.  I discussed additional management with her in the ED or additional time for observation but she would prefer to go home and get some sleep and follow-up with her primary care doctor, Dr. Hyacinth Meeker.  I think that is very reasonable.  Her son is now at bedside and is also comfortable with the plan.  Prescription as listed below and I gave my usual and customary management recommendations and return precautions. [CF]  0541 Of note, her potassium is a little bit low at 3.1 so ordered 40 mill equivalents by mouth and her calcium is little bit low at 7.6, so ordered 800 mg of elemental calcium in the form of calcium carbonate.  I updated the patient for the reason and she understands and agrees with the plan and we will continue with the plan for discharge once she has received her supplements. [CF]    Clinical Course User Index [CF] Loleta Rose, MD     FINAL CLINICAL IMPRESSION(S) / ED DIAGNOSES   Final diagnoses:  Vertigo  Hypokalemia  Hypocalcemia     Rx / DC Orders   ED Discharge Orders          Ordered    meclizine (ANTIVERT) 12.5 MG tablet  3 times daily PRN        10/17/22 0543             Note:  This document was prepared using  Dragon voice recognition software and may include unintentional dictation errors.   Loleta Rose, MD 10/17/22 [phone]

## 2022-10-17 NOTE — Discharge Instructions (Signed)
We believe your symptoms were caused by benign vertigo.  Please read through the included information and take any prescribed medication(s).  Follow up with your doctor as listed above.  If you develop any new or worsening symptoms that concern you, including but not limited to persistent dizziness/vertigo, numbness or weakness in your arms or legs, altered mental status, persistent vomiting, or fever greater than 101, please return immediately to the Emergency Department.  

## 2022-12-01 ENCOUNTER — Other Ambulatory Visit: Payer: Self-pay | Admitting: Internal Medicine

## 2022-12-01 DIAGNOSIS — I5023 Acute on chronic systolic (congestive) heart failure: Secondary | ICD-10-CM

## 2022-12-01 DIAGNOSIS — R918 Other nonspecific abnormal finding of lung field: Secondary | ICD-10-CM

## 2022-12-09 ENCOUNTER — Ambulatory Visit
Admission: RE | Admit: 2022-12-09 | Discharge: 2022-12-09 | Disposition: A | Discharge status: Home / Self Care | Payer: Medicare Other | Source: Ambulatory Visit | Attending: Internal Medicine | Admitting: Internal Medicine

## 2022-12-09 DIAGNOSIS — R918 Other nonspecific abnormal finding of lung field: Secondary | ICD-10-CM | POA: Diagnosis present

## 2022-12-09 DIAGNOSIS — I5023 Acute on chronic systolic (congestive) heart failure: Secondary | ICD-10-CM | POA: Insufficient documentation

## 2023-01-16 ENCOUNTER — Other Ambulatory Visit: Payer: Self-pay | Admitting: Internal Medicine

## 2023-01-16 DIAGNOSIS — Z1231 Encounter for screening mammogram for malignant neoplasm of breast: Secondary | ICD-10-CM

## 2023-02-13 ENCOUNTER — Ambulatory Visit
Admission: RE | Admit: 2023-02-13 | Discharge: 2023-02-13 | Disposition: A | Discharge status: Home / Self Care | Payer: Medicare Other | Source: Ambulatory Visit | Attending: Internal Medicine | Admitting: Internal Medicine

## 2023-02-13 DIAGNOSIS — Z1231 Encounter for screening mammogram for malignant neoplasm of breast: Secondary | ICD-10-CM | POA: Insufficient documentation

## 2023-03-16 ENCOUNTER — Encounter: Payer: Self-pay | Admitting: Ophthalmology

## 2023-03-17 ENCOUNTER — Encounter: Payer: Self-pay | Admitting: Ophthalmology

## 2023-03-17 NOTE — Anesthesia Preprocedure Evaluation (Addendum)
 Anesthesia Evaluation  Patient identified by MRN, date of birth, ID band Patient awake    Reviewed: Allergy & Precautions, H&P , NPO status , Patient's Chart, lab work & pertinent test results  Airway Mallampati: I  TM Distance: >3 FB Neck ROM: Full    Dental no notable dental hx. (+) Partial Lower   Pulmonary neg pulmonary ROS, asthma    Pulmonary exam normal breath sounds clear to auscultation       Cardiovascular hypertension, +CHF  Normal cardiovascular exam+ dysrhythmias + Cardiac Defibrillator  Rhythm:Regular Rate:Normal  08-26-21 1. Left ventricular ejection fraction, by estimation, is 20 to 25%. The  left ventricle has severely decreased function. The left ventricle  demonstrates global hypokinesis. The left ventricular internal cavity size  was moderately dilated. Left  ventricular diastolic parameters are consistent with Grade II diastolic  dysfunction (pseudonormalization). The average left ventricular global  longitudinal strain is -5.1 %.   2. Right ventricular systolic function is normal. The right ventricular  size is normal. There is mildly elevated pulmonary artery systolic  pressure.   3. The mitral valve is normal in structure. Moderate mitral valve  regurgitation. No evidence of mitral stenosis.   4. The aortic valve is normal in structure. Aortic valve regurgitation is  moderate. No aortic stenosis is present.   5. The inferior vena cava is normal in size with greater than 50%  respiratory variability, suggesting right atrial pressure of 3 mmHg.   08-30-21 onclusions: 1. Mild, nonobstructive coronary artery disease with 10-20% proximal LAD and 20% ostial RCA stenoses.  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 2. Normal left heart, right heart, and pulmonary artery pressures. 3. Normal Fick cardiac output/index.    Neuro/Psych negative neurological ROS  negative psych ROS    GI/Hepatic negative GI ROS, Neg liver ROS,,,  Endo/Other  diabetes    Renal/GU negative Renal ROS  negative genitourinary   Musculoskeletal negative musculoskeletal ROS (+)    Abdominal   Peds negative pediatric ROS (+)  Hematology negative hematology ROS (+)   Anesthesia Other Findings   Hypertension  High cholesterol Allergic rhinitis  CHF (congestive heart failure) (HCC) AICD (automatic cardioverter/defibrillator) present  LBBB (left bundle branch block) Cardiomyopathy (HCC)  Wears hearing aid in both ears Wears dentures  Vertigo Asthma  Type 2 diabetes mellitus (HCC) Chronic systolic (congestive) heart failure (HCC) CHF (congestive heart failure), NYHA class II (HCC) History of angioedema  Grade II diastolic dysfunction Moderate aortic regurgitation  Moderate mitral regurgitation by prior echocardiogram Low left ventricular ejection fraction  Note EF of 20-25%    Reproductive/Obstetrics negative OB ROS                             Anesthesia Physical Anesthesia Plan  ASA: 4  Anesthesia Plan: MAC   Post-op Pain Management:    Induction: Intravenous  PONV Risk Score and Plan:   Airway Management Planned: Natural Airway and Nasal Cannula  Additional Equipment:   Intra-op Plan:   Post-operative Plan:   Informed Consent: I have reviewed the patients History and Physical, chart, labs and discussed the procedure including the risks, benefits and alternatives for the proposed anesthesia with the patient or authorized representative who has indicated his/her understanding and acceptance.     Dental Advisory Given  Plan Discussed with: Anesthesiologist, CRNA and Surgeon  Anesthesia Plan Comments: (Patient consented for risks of anesthesia including but not limited to:  - adverse reactions to medications -  damage to eyes, teeth, lips or other oral mucosa - nerve damage due to positioning  - sore throat or hoarseness -  Damage to heart, brain, nerves, lungs, other parts of body or loss of life  Patient voiced understanding and assent.)       Anesthesia Quick Evaluation

## 2023-03-22 NOTE — Discharge Instructions (Signed)

## 2023-03-27 ENCOUNTER — Encounter: Payer: Self-pay | Admitting: Ophthalmology

## 2023-03-27 ENCOUNTER — Encounter
Admission: RE | Disposition: A | Discharge status: Home / Self Care | Payer: Self-pay | Source: Home / Self Care | Attending: Ophthalmology

## 2023-03-27 ENCOUNTER — Ambulatory Visit: Payer: Self-pay | Admitting: Anesthesiology

## 2023-03-27 ENCOUNTER — Ambulatory Visit
Admission: RE | Admit: 2023-03-27 | Discharge: 2023-03-27 | Disposition: A | Discharge status: Home / Self Care | Payer: Medicare Other | Attending: Ophthalmology | Admitting: Ophthalmology

## 2023-03-27 ENCOUNTER — Other Ambulatory Visit: Payer: Self-pay

## 2023-03-27 DIAGNOSIS — Z7984 Long term (current) use of oral hypoglycemic drugs: Secondary | ICD-10-CM | POA: Insufficient documentation

## 2023-03-27 DIAGNOSIS — I2510 Atherosclerotic heart disease of native coronary artery without angina pectoris: Secondary | ICD-10-CM | POA: Insufficient documentation

## 2023-03-27 DIAGNOSIS — E1151 Type 2 diabetes mellitus with diabetic peripheral angiopathy without gangrene: Secondary | ICD-10-CM | POA: Diagnosis not present

## 2023-03-27 DIAGNOSIS — Z79899 Other long term (current) drug therapy: Secondary | ICD-10-CM | POA: Diagnosis not present

## 2023-03-27 DIAGNOSIS — H2511 Age-related nuclear cataract, right eye: Secondary | ICD-10-CM | POA: Insufficient documentation

## 2023-03-27 DIAGNOSIS — J45909 Unspecified asthma, uncomplicated: Secondary | ICD-10-CM | POA: Diagnosis not present

## 2023-03-27 DIAGNOSIS — Z7982 Long term (current) use of aspirin: Secondary | ICD-10-CM | POA: Insufficient documentation

## 2023-03-27 DIAGNOSIS — I110 Hypertensive heart disease with heart failure: Secondary | ICD-10-CM | POA: Insufficient documentation

## 2023-03-27 DIAGNOSIS — E1136 Type 2 diabetes mellitus with diabetic cataract: Secondary | ICD-10-CM | POA: Diagnosis present

## 2023-03-27 DIAGNOSIS — I080 Rheumatic disorders of both mitral and aortic valves: Secondary | ICD-10-CM | POA: Insufficient documentation

## 2023-03-27 DIAGNOSIS — I5022 Chronic systolic (congestive) heart failure: Secondary | ICD-10-CM | POA: Diagnosis not present

## 2023-03-27 HISTORY — DX: Cardiomyopathy, unspecified: I42.9

## 2023-03-27 HISTORY — DX: Other cardiomyopathies: I42.8

## 2023-03-27 HISTORY — DX: Type 2 diabetes mellitus with diabetic peripheral angiopathy without gangrene: E11.51

## 2023-03-27 HISTORY — DX: Abnormal result of cardiovascular function study, unspecified: R94.30

## 2023-03-27 HISTORY — DX: Type 2 diabetes mellitus without complications: E11.9

## 2023-03-27 HISTORY — DX: Presence of dental prosthetic device (complete) (partial): Z97.2

## 2023-03-27 HISTORY — DX: Unspecified asthma, uncomplicated: J45.909

## 2023-03-27 HISTORY — DX: Chronic systolic (congestive) heart failure: I50.22

## 2023-03-27 HISTORY — DX: Left bundle-branch block, unspecified: I44.7

## 2023-03-27 HISTORY — DX: Personal history of other specified conditions: Z87.898

## 2023-03-27 HISTORY — DX: Nonrheumatic aortic (valve) insufficiency: I35.1

## 2023-03-27 HISTORY — DX: Dizziness and giddiness: R42

## 2023-03-27 HISTORY — DX: Dilated cardiomyopathy: I42.0

## 2023-03-27 HISTORY — DX: Other ill-defined heart diseases: I51.89

## 2023-03-27 HISTORY — DX: Nonrheumatic mitral (valve) insufficiency: I34.0

## 2023-03-27 HISTORY — DX: Presence of external hearing-aid: Z97.4

## 2023-03-27 HISTORY — DX: Abnormal findings on diagnostic imaging of heart and coronary circulation: R93.1

## 2023-03-27 HISTORY — DX: Heart failure, unspecified: I50.9

## 2023-03-27 LAB — GLUCOSE, CAPILLARY: Glucose-Capillary: 97 mg/dL (ref 70–99)

## 2023-03-27 SURGERY — PHACOEMULSIFICATION, CATARACT, WITH IOL INSERTION
Anesthesia: Monitor Anesthesia Care | Site: Eye | Laterality: Right

## 2023-03-27 MED ORDER — FENTANYL CITRATE (PF) 100 MCG/2ML IJ SOLN
INTRAMUSCULAR | Status: AC
  Filled 2023-03-27: qty 2

## 2023-03-27 MED ORDER — TETRACAINE HCL 0.5 % OP SOLN
OPHTHALMIC | Status: AC
  Filled 2023-03-27: qty 4

## 2023-03-27 MED ORDER — SIGHTPATH DOSE#1 BSS IO SOLN
INTRAOCULAR | Status: DC | PRN
Start: 2023-03-27 — End: 2023-03-27
  Administered 2023-03-27: 15 mL via INTRAOCULAR

## 2023-03-27 MED ORDER — LIDOCAINE HCL (PF) 2 % IJ SOLN
INTRAOCULAR | Status: DC | PRN
  Administered 2023-03-27: 4 mL via INTRAOCULAR

## 2023-03-27 MED ORDER — MOXIFLOXACIN HCL 0.5 % OP SOLN
OPHTHALMIC | Status: DC | PRN
  Administered 2023-03-27: .2 mL via OPHTHALMIC

## 2023-03-27 MED ORDER — ARMC OPHTHALMIC DILATING DROPS
1.0000 | OPHTHALMIC | Status: DC | PRN
  Administered 2023-03-27 (×3): 1 via OPHTHALMIC

## 2023-03-27 MED ORDER — MIDAZOLAM HCL 2 MG/2ML IJ SOLN
INTRAMUSCULAR | Status: DC | PRN
  Administered 2023-03-27: 1 mg via INTRAVENOUS

## 2023-03-27 MED ORDER — MIDAZOLAM HCL 2 MG/2ML IJ SOLN
INTRAMUSCULAR | Status: AC
  Filled 2023-03-27: qty 2

## 2023-03-27 MED ORDER — SIGHTPATH DOSE#1 BSS IO SOLN
INTRAOCULAR | Status: DC | PRN
  Administered 2023-03-27: 83 mL via OPHTHALMIC

## 2023-03-27 MED ORDER — FENTANYL CITRATE (PF) 100 MCG/2ML IJ SOLN
INTRAMUSCULAR | Status: DC | PRN
  Administered 2023-03-27: 50 ug via INTRAVENOUS

## 2023-03-27 MED ORDER — ARMC OPHTHALMIC DILATING DROPS
OPHTHALMIC | Status: AC
  Filled 2023-03-27: qty 0.5

## 2023-03-27 MED ORDER — TETRACAINE HCL 0.5 % OP SOLN
1.0000 [drp] | OPHTHALMIC | Status: DC | PRN
  Administered 2023-03-27 (×3): 1 [drp] via OPHTHALMIC

## 2023-03-27 MED ORDER — SIGHTPATH DOSE#1 NA HYALUR & NA CHOND-NA HYALUR IO KIT
PACK | INTRAOCULAR | Status: DC | PRN
  Administered 2023-03-27: 1 via OPHTHALMIC

## 2023-03-27 SURGICAL SUPPLY — 12 items
CATARACT SUITE SIGHTPATH (MISCELLANEOUS) ×1 IMPLANT
DISSECTOR HYDRO NUCLEUS 50X22 (MISCELLANEOUS) ×1 IMPLANT
FEE CATARACT SUITE SIGHTPATH (MISCELLANEOUS) ×1 IMPLANT
GLOVE PI ULTRA LF STRL 7.5 (GLOVE) ×1 IMPLANT
GLOVE SURG POLYISOPRENE 8.5 (GLOVE) ×1 IMPLANT
GLOVE SURG PROTEXIS BL SZ6.5 (GLOVE) ×1 IMPLANT
GLOVE SURG SYN 6.5 PF PI BL (GLOVE) ×1 IMPLANT
GLOVE SURG SYN 8.5 PF PI BL (GLOVE) ×1 IMPLANT
LENS IOL TECNIS EYHANCE 23.5 (Intraocular Lens) IMPLANT
NDL FILTER BLUNT 18X1 1/2 (NEEDLE) ×1 IMPLANT
NEEDLE FILTER BLUNT 18X1 1/2 (NEEDLE) ×1 IMPLANT
SYR 3ML LL SCALE MARK (SYRINGE) ×1 IMPLANT

## 2023-03-27 NOTE — Anesthesia Postprocedure Evaluation (Signed)
 Anesthesia Post Note  Patient: Glenda May  Procedure(s) Performed: CATARACT EXTRACTION PHACO AND INTRAOCULAR LENS PLACEMENT (IOC) RIGHT DIABETIC 6.34 00:39.4 (Right: Eye)  Patient location during evaluation: PACU Anesthesia Type: MAC Level of consciousness: awake and alert Pain management: pain level controlled Vital Signs Assessment: post-procedure vital signs reviewed and stable Respiratory status: spontaneous breathing, nonlabored ventilation, respiratory function stable and patient connected to nasal cannula oxygen Cardiovascular status: stable and blood pressure returned to baseline Postop Assessment: no apparent nausea or vomiting Anesthetic complications: no   No notable events documented.   Last Vitals:  Vitals:   03/27/23 0818 03/27/23 0826  BP: 136/69 (!) 140/71  Pulse: 80 80  Resp: 18 20  Temp: (!) 36.1 C 36.7 C  SpO2: 100% 100%    Last Pain:  Vitals:   03/27/23 0826  TempSrc:   PainSc: 0-No pain                 Marisue Humble

## 2023-03-27 NOTE — H&P (Signed)
 Glenda May   Primary Care Physician:  Glenda Penton, MD Ophthalmologist: Dr. Willey May  Pre-Procedure History & Physical: HPI:  Glenda May is a 78 y.o. female here for cataract surgery.   Past Medical History:  Diagnosis Date   AICD (automatic cardioverter/defibrillator) present 03/29/2022   Allergic rhinitis    Asthma    Cardiomyopathy (HCC)    CHF (congestive heart failure) (HCC)    CHF (congestive heart failure), NYHA class II (HCC)    Chronic systolic (congestive) heart failure (HCC)    Dilated cardiomyopathy (HCC)    Grade II diastolic dysfunction    High cholesterol    History of angioedema    Hypertension    LBBB (left bundle branch block)    Low left ventricular ejection fraction    Moderate aortic regurgitation    Moderate mitral regurgitation by prior echocardiogram    Non-ischemic cardiomyopathy (HCC)    Type 2 diabetes mellitus (HCC)    "borderline"   Type 2 diabetes mellitus with peripheral angiopathy (HCC)    Vertigo    Wears dentures    partial lower   Wears hearing aid in both ears     Past Surgical History:  Procedure Laterality Date   APPENDECTOMY     CARDIAC DEFIBRILLATOR PLACEMENT  03/29/2022   CHOLECYSTECTOMY     LUMBAR LAMINECTOMY     L4-5   RIGHT/LEFT HEART CATH AND CORONARY ANGIOGRAPHY N/A 08/30/2021   Procedure: RIGHT/LEFT HEART CATH AND CORONARY ANGIOGRAPHY;  Surgeon: Yvonne Kendall, MD;  Location: [facility] INVASIVE CV LAB;  Service: Cardiovascular;  Laterality: N/A;   TONSILLECTOMY      Prior to Admission medications   Medication Sig Start Date End Date Taking? Authorizing Provider  albuterol (VENTOLIN HFA) 108 (90 Base) MCG/ACT inhaler Inhale into the lungs every 6 (six) hours as needed for wheezing or shortness of breath.   Yes [provider]  aspirin EC 81 MG tablet Take 1 tablet (81 mg total) by mouth daily. Swallow whole. 09/01/21  Yes Enedina Finner, MD  Calcium Carbonate-Vit D-Min (CALTRATE 600+D PLUS  MINERALS) 600-800 MG-UNIT TABS Take 1 tablet by mouth daily.   Yes [provider]  dapagliflozin propanediol (FARXIGA) 10 MG TABS tablet Take 1 tablet (10 mg total) by mouth daily. 09/01/21  Yes Enedina Finner, MD  famotidine (PEPCID) 40 MG tablet Take 40 mg by mouth daily.   Yes [provider]  furosemide (LASIX) 40 MG tablet Take 0.5 tablets (20 mg total) by mouth daily. And extra 20mg  PRN for weight gain, swelling or SOB 11/24/21  Yes Hackney, Inetta Fermo A, FNP  loratadine (CLARITIN) 10 MG tablet Take 10 mg by mouth daily.   Yes [provider]  meclizine (ANTIVERT) 12.5 MG tablet Take 1 tablet (12.5 mg total) by mouth 3 (three) times daily as needed for dizziness. 10/17/22  Yes Loleta Rose, MD  Melatonin 5 MG CAPS Take 5 mg by mouth at bedtime.   Yes [provider]  metoprolol succinate (TOPROL-XL) 25 MG 24 hr tablet Take 1 tablet (25 mg total) by mouth daily. 10/18/21  Yes Hackney, Jarold Song, FNP  Multiple Vitamins-Minerals (WOMENS MULTI GUMMIES PO) Take 1 each by mouth daily.   Yes [provider]  nitroGLYCERIN (NITROSTAT) 0.4 MG SL tablet Place 0.4 mg under the tongue every 5 (five) minutes as needed for chest pain.   Yes [provider]  pantoprazole (PROTONIX) 40 MG tablet Take 40 mg by mouth daily.   Yes [provider]  potassium chloride (KLOR-CON) 10 MEQ tablet Take 10 mEq by mouth daily.   Yes [provider]  rosuvastatin (CRESTOR) 20 MG tablet Take 20 mg by mouth daily. 09/30/21  Yes [provider]  spironolactone (ALDACTONE) 25 MG tablet Take 0.5 tablets (12.5 mg total) by mouth daily. 09/01/21  Yes Enedina Finner, MD  TURMERIC PO Take by mouth daily.   Yes [provider]    Allergies as of 02/17/2023 - Reviewed 10/17/2022  Allergen Reaction Noted   Ace inhibitors Other (See Comments) 08/30/2021    Family History  Problem Relation Age of Onset   Stroke Mother        died @ 62   Dementia Mother     Stroke Father        died @ 41   Stroke Sister    Heart attack Brother        died @ 75   Breast cancer Neg Hx     Social History   Socioeconomic History   Marital status: Single    Spouse name: Not on file   Number of children: Not on file   Years of education: Not on file   Highest education level: Not on file  Occupational History   Not on file  Tobacco Use   Smoking status: Never   Smokeless tobacco: Never  Vaping Use   Vaping status: Never Used  Substance and Sexual Activity   Alcohol use: Not Currently   Drug use: Never   Sexual activity: Not Currently  Other Topics Concern   Not on file  Social History Narrative   Lives locally.  Does not routinely exercise.   Social Drivers of Health   Financial Resource Strain: Medium Risk (11/28/2022)   Received from [facility]   Overall Financial Resource Strain (CARDIA)    Difficulty of Paying Living Expenses: Somewhat hard  Food Insecurity: Food Insecurity Present (11/28/2022)   Received from [facility]   Hunger Vital Sign    Worried About Running Out of Food in the Last Year: Sometimes true    Ran Out of Food in the Last Year: Sometimes true  Transportation Needs: No Transportation Needs (11/28/2022)   Received from [facility] - Transportation    In the past 12 months, has lack of transportation kept you from medical appointments or from getting medications?: No    Lack of Transportation (Non-Medical): No  Physical Activity: Not on file  Stress: Not on file  Social Connections: Not on file  Intimate Partner Violence: Not on file    Review of Systems: See HPI, otherwise negative ROS  Physical Exam: BP (!) 154/69   Temp (!) 97.3 F (36.3 C) (Temporal)   Ht 5' 2.01" (1.575 m)   Wt 58.5 kg   SpO2 99%   BMI 23.59 kg/m  General:   Alert, cooperative in NAD Head:  Normocephalic and atraumatic. Respiratory:  Normal work of  breathing. Cardiovascular:  RRR  Impression/Plan:  Glenda May is here for cataract surgery.  Risks, benefits, limitations, and alternatives regarding cataract surgery have been reviewed with the patient.  Questions have been answered.  All parties agreeable.   Glenda Blade, MD  03/27/2023, 7:20 AM

## 2023-03-27 NOTE — Transfer of Care (Signed)
 Immediate Anesthesia Transfer of Care Note  Patient: Glenda May  Procedure(s) Performed: CATARACT EXTRACTION PHACO AND INTRAOCULAR LENS PLACEMENT (IOC) RIGHT DIABETIC 6.34 00:39.4 (Right: Eye)  Patient Location: PACU  Anesthesia Type: MAC  Level of Consciousness: awake, alert  and patient cooperative  Airway and Oxygen Therapy: Patient Spontanous Breathing and Patient connected to supplemental oxygen  Post-op Assessment: Post-op Vital signs reviewed, Patient's Cardiovascular Status Stable, Respiratory Function Stable, Patent Airway and No signs of Nausea or vomiting  Post-op Vital Signs: Reviewed and stable  Complications: No notable events documented.

## 2023-03-27 NOTE — Op Note (Signed)
 OPERATIVE NOTE  Glenda May [id number] 03/27/2023   PREOPERATIVE DIAGNOSIS:  Nuclear sclerotic cataract right eye.  H25.11   POSTOPERATIVE DIAGNOSIS:    Nuclear sclerotic cataract right eye.     PROCEDURE:  Phacoemusification with posterior chamber intraocular lens placement of the right eye   LENS:   Implant Name Type Inv. Item Serial No. Manufacturer Lot No. LRB No. Used Action  LENS IOL TECNIS EYHANCE 23.5 - [id number] Intraocular Lens LENS IOL TECNIS EYHANCE 23.5 [id number] [employer]  Right 1 Implanted       Procedure(s): CATARACT EXTRACTION PHACO AND INTRAOCULAR LENS PLACEMENT (IOC) RIGHT DIABETIC 6.34 00:39.4 (Right)  SURGEON:  Willey Blade, MD, MPH  ANESTHESIOLOGIST: Anesthesiologist: Marisue Humble, MD CRNA: Elisabeth Pigeon, CRNA   ANESTHESIA:  Topical with tetracaine drops augmented with 1% preservative-free intracameral lidocaine.  ESTIMATED BLOOD LOSS: less than 1 mL.   COMPLICATIONS:  None.   DESCRIPTION OF PROCEDURE:  The patient was identified in the holding room and transported to the operating room and placed in the supine position under the operating microscope.  The right eye was identified as the operative eye and it was prepped and draped in the usual sterile ophthalmic fashion.   A 1.0 millimeter clear-corneal paracentesis was made at the 10:30 position. 0.5 ml of preservative-free 1% lidocaine with epinephrine was injected into the anterior chamber.  The anterior chamber was filled with viscoelastic.  A 2.4 millimeter keratome was used to make a near-clear corneal incision at the 8:00 position.  A curvilinear capsulorrhexis was made with a cystotome and capsulorrhexis forceps.  Balanced salt solution was used to hydrodissect and hydrodelineate the nucleus.   Phacoemulsification was then used in stop and chop fashion to remove the lens nucleus and epinucleus.  The remaining cortex was then removed using the irrigation and aspiration handpiece.  Viscoelastic was then placed into the capsular bag to distend it for lens placement.  A lens was then injected into the capsular bag.  The remaining viscoelastic was aspirated.   Wounds were hydrated with balanced salt solution.  The anterior chamber was inflated to a physiologic pressure with balanced salt solution.   Intracameral vigamox 0.1 mL undiluted was injected into the eye and a drop placed onto the ocular surface.  No wound leaks were noted.  The patient was taken to the recovery room in stable condition without complications of anesthesia or surgery  Willey Blade 03/27/2023, 8:16 AM

## 2023-03-28 ENCOUNTER — Encounter: Payer: Self-pay | Admitting: Ophthalmology

## 2023-03-28 NOTE — Anesthesia Preprocedure Evaluation (Addendum)
 Anesthesia Evaluation  Patient identified by MRN, date of birth, ID band Patient awake    Reviewed: Allergy & Precautions, H&P , NPO status , Patient's Chart, lab work & pertinent test results  Airway Mallampati: III  TM Distance: >3 FB Neck ROM: Full    Dental  (+) Partial Lower   Pulmonary neg pulmonary ROS, asthma    Pulmonary exam normal breath sounds clear to auscultation       Cardiovascular hypertension, + Peripheral Vascular Disease and +CHF  Normal cardiovascular exam+ dysrhythmias + Cardiac Defibrillator  Rhythm:Regular Rate:Normal  08-26-21 1. Left ventricular ejection fraction, by estimation, is 20 to 25%. The  left ventricle has severely decreased function. The left ventricle  demonstrates global hypokinesis. The left ventricular internal cavity size  was moderately dilated. Left  ventricular diastolic parameters are consistent with Grade II diastolic  dysfunction (pseudonormalization). The average left ventricular global  longitudinal strain is -5.1 %.   2. Right ventricular systolic function is normal. The right ventricular  size is normal. There is mildly elevated pulmonary artery systolic  pressure.   3. The mitral valve is normal in structure. Moderate mitral valve  regurgitation. No evidence of mitral stenosis.   4. The aortic valve is normal in structure. Aortic valve regurgitation is  moderate. No aortic stenosis is present.   5. The inferior vena cava is normal in size with greater than 50%  respiratory variability, suggesting right atrial pressure of 3 mmHg.    08-30-21 onclusions: 1.         Mild, nonobstructive coronary artery disease with 10-20% proximal LAD and 20% ostial RCA stenoses.  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 2.Normal left heart, right heart, and pulmonary artery pressures. 3.Normal Fick cardiac output/index.    Neuro/Psych negative neurological ROS  negative  psych ROS   GI/Hepatic negative GI ROS, Neg liver ROS,,,  Endo/Other  diabetes    Renal/GU negative Renal ROS  negative genitourinary   Musculoskeletal negative musculoskeletal ROS (+)    Abdominal   Peds negative pediatric ROS (+)  Hematology negative hematology ROS (+)   Anesthesia Other Findings Repeat cataract 03-27-23  Hypertension  High cholesterol Allergic rhinitis  CHF (congestive heart failure) (HCC) AICD (automatic cardioverter/defibrillator) present  LBBB (left bundle branch block) Cardiomyopathy (HCC)  Wears hearing aid in both ears Wears dentures  Vertigo Asthma  Type 2 diabetes mellitus (HCC) Chronic systolic (congestive) heart failure CHF (congestive heart failure), NYHA class II (HCC) History of angioedema  Grade II diastolic dysfunction Moderate aortic regurgitation by prior echocardiogram Low left ventricular ejection fraction Dilated cardiomyopathy (HCC) Non-ischemic cardiomyopathy (HCC) Type 2 diabetes mellitus with peripheral angiopathy (HCC)    Reproductive/Obstetrics negative OB ROS                             Anesthesia Physical Anesthesia Plan  ASA: 4  Anesthesia Plan: MAC   Post-op Pain Management:    Induction: Intravenous  PONV Risk Score and Plan:   Airway Management Planned: Natural Airway and Nasal Cannula  Additional Equipment:   Intra-op Plan:   Post-operative Plan:   Informed Consent: I have reviewed the patients History and Physical, chart, labs and discussed the procedure including the risks, benefits and alternatives for the proposed anesthesia with the patient or authorized representative who has indicated his/her understanding and acceptance.     Dental Advisory Given  Plan Discussed with: Anesthesiologist, CRNA and Surgeon  Anesthesia Plan Comments: (Patient consented  for risks of anesthesia including but not limited to:  - adverse reactions to medications - damage to eyes,  teeth, lips or other oral mucosa - nerve damage due to positioning  - sore throat or hoarseness - Damage to heart, brain, nerves, lungs, other parts of body or loss of life  Patient voiced understanding and assent.)        Anesthesia Quick Evaluation

## 2023-04-06 NOTE — Discharge Instructions (Signed)

## 2023-04-10 ENCOUNTER — Encounter: Payer: Self-pay | Admitting: Ophthalmology

## 2023-04-10 ENCOUNTER — Encounter
Admission: RE | Disposition: A | Discharge status: Home / Self Care | Payer: Self-pay | Source: Home / Self Care | Attending: Ophthalmology

## 2023-04-10 ENCOUNTER — Ambulatory Visit
Admission: RE | Admit: 2023-04-10 | Discharge: 2023-04-10 | Disposition: A | Discharge status: Home / Self Care | Payer: Medicare Other | Attending: Ophthalmology | Admitting: Ophthalmology

## 2023-04-10 ENCOUNTER — Other Ambulatory Visit: Payer: Self-pay

## 2023-04-10 ENCOUNTER — Ambulatory Visit: Payer: Self-pay | Admitting: Anesthesiology

## 2023-04-10 DIAGNOSIS — J45909 Unspecified asthma, uncomplicated: Secondary | ICD-10-CM | POA: Insufficient documentation

## 2023-04-10 DIAGNOSIS — I420 Dilated cardiomyopathy: Secondary | ICD-10-CM | POA: Insufficient documentation

## 2023-04-10 DIAGNOSIS — Z79899 Other long term (current) drug therapy: Secondary | ICD-10-CM | POA: Insufficient documentation

## 2023-04-10 DIAGNOSIS — Z7982 Long term (current) use of aspirin: Secondary | ICD-10-CM | POA: Insufficient documentation

## 2023-04-10 DIAGNOSIS — I080 Rheumatic disorders of both mitral and aortic valves: Secondary | ICD-10-CM | POA: Insufficient documentation

## 2023-04-10 DIAGNOSIS — H2512 Age-related nuclear cataract, left eye: Secondary | ICD-10-CM | POA: Insufficient documentation

## 2023-04-10 DIAGNOSIS — I110 Hypertensive heart disease with heart failure: Secondary | ICD-10-CM | POA: Insufficient documentation

## 2023-04-10 DIAGNOSIS — Z7984 Long term (current) use of oral hypoglycemic drugs: Secondary | ICD-10-CM | POA: Diagnosis not present

## 2023-04-10 DIAGNOSIS — E1136 Type 2 diabetes mellitus with diabetic cataract: Secondary | ICD-10-CM | POA: Insufficient documentation

## 2023-04-10 DIAGNOSIS — Z95810 Presence of automatic (implantable) cardiac defibrillator: Secondary | ICD-10-CM | POA: Insufficient documentation

## 2023-04-10 DIAGNOSIS — E1151 Type 2 diabetes mellitus with diabetic peripheral angiopathy without gangrene: Secondary | ICD-10-CM | POA: Diagnosis not present

## 2023-04-10 DIAGNOSIS — I5022 Chronic systolic (congestive) heart failure: Secondary | ICD-10-CM | POA: Diagnosis not present

## 2023-04-10 LAB — GLUCOSE, CAPILLARY: Glucose-Capillary: 110 mg/dL — ABNORMAL HIGH (ref 70–99)

## 2023-04-10 SURGERY — PHACOEMULSIFICATION, CATARACT, WITH IOL INSERTION
Anesthesia: Monitor Anesthesia Care | Laterality: Left

## 2023-04-10 MED ORDER — SIGHTPATH DOSE#1 NA HYALUR & NA CHOND-NA HYALUR IO KIT
PACK | INTRAOCULAR | Status: DC | PRN
  Administered 2023-04-10: 1 via OPHTHALMIC

## 2023-04-10 MED ORDER — LIDOCAINE HCL (PF) 2 % IJ SOLN
INTRAOCULAR | Status: DC | PRN
  Administered 2023-04-10: 4 mL via INTRAOCULAR

## 2023-04-10 MED ORDER — MIDAZOLAM HCL 2 MG/2ML IJ SOLN
INTRAMUSCULAR | Status: DC | PRN
  Administered 2023-04-10 (×2): .5 mg via INTRAVENOUS

## 2023-04-10 MED ORDER — FENTANYL CITRATE (PF) 100 MCG/2ML IJ SOLN
INTRAMUSCULAR | Status: AC
  Filled 2023-04-10: qty 2

## 2023-04-10 MED ORDER — TETRACAINE HCL 0.5 % OP SOLN
OPHTHALMIC | Status: AC
  Filled 2023-04-10: qty 4

## 2023-04-10 MED ORDER — SODIUM CHLORIDE 0.9% FLUSH
INTRAVENOUS | Status: DC | PRN
  Administered 2023-04-10: 10 mL via INTRAVENOUS

## 2023-04-10 MED ORDER — ARMC OPHTHALMIC DILATING DROPS
OPHTHALMIC | Status: AC
  Filled 2023-04-10: qty 0.5

## 2023-04-10 MED ORDER — FENTANYL CITRATE (PF) 100 MCG/2ML IJ SOLN
INTRAMUSCULAR | Status: DC | PRN
  Administered 2023-04-10: 25 ug via INTRAVENOUS

## 2023-04-10 MED ORDER — ARMC OPHTHALMIC DILATING DROPS
1.0000 | OPHTHALMIC | Status: DC | PRN
  Administered 2023-04-10 (×3): 1 via OPHTHALMIC

## 2023-04-10 MED ORDER — MOXIFLOXACIN HCL 0.5 % OP SOLN
OPHTHALMIC | Status: DC | PRN
  Administered 2023-04-10: .2 mL via OPHTHALMIC

## 2023-04-10 MED ORDER — SIGHTPATH DOSE#1 BSS IO SOLN
INTRAOCULAR | Status: DC | PRN
  Administered 2023-04-10: 80 mL via OPHTHALMIC

## 2023-04-10 MED ORDER — TETRACAINE HCL 0.5 % OP SOLN
1.0000 [drp] | OPHTHALMIC | Status: DC | PRN
  Administered 2023-04-10 (×3): 1 [drp] via OPHTHALMIC

## 2023-04-10 MED ORDER — MIDAZOLAM HCL 2 MG/2ML IJ SOLN
INTRAMUSCULAR | Status: AC
  Filled 2023-04-10: qty 2

## 2023-04-10 MED ORDER — SIGHTPATH DOSE#1 BSS IO SOLN
INTRAOCULAR | Status: DC | PRN
  Administered 2023-04-10: 15 mL via INTRAOCULAR

## 2023-04-10 SURGICAL SUPPLY — 12 items
CATARACT SUITE SIGHTPATH (MISCELLANEOUS) ×1 IMPLANT
DISSECTOR HYDRO NUCLEUS 50X22 (MISCELLANEOUS) ×1 IMPLANT
FEE CATARACT SUITE SIGHTPATH (MISCELLANEOUS) ×1 IMPLANT
GLOVE PI ULTRA LF STRL 7.5 (GLOVE) ×1 IMPLANT
GLOVE SURG POLYISOPRENE 8.5 (GLOVE) ×1 IMPLANT
GLOVE SURG PROTEXIS BL SZ6.5 (GLOVE) ×1 IMPLANT
GLOVE SURG SYN 6.5 PF PI BL (GLOVE) ×1 IMPLANT
GLOVE SURG SYN 8.5 PF PI BL (GLOVE) ×1 IMPLANT
LENS IOL TECNIS EYHANCE 23.5 (Intraocular Lens) IMPLANT
NDL FILTER BLUNT 18X1 1/2 (NEEDLE) ×1 IMPLANT
NEEDLE FILTER BLUNT 18X1 1/2 (NEEDLE) ×1 IMPLANT
SYR 3ML LL SCALE MARK (SYRINGE) ×1 IMPLANT

## 2023-04-10 NOTE — H&P (Signed)
 [facility]   Primary Care Physician:  Danella Penton, MD Ophthalmologist: Dr. Willey Blade  Pre-Procedure History & Physical: HPI:  Glenda May is a 78 y.o. female here for cataract surgery.   Past Medical History:  Diagnosis Date   AICD (automatic cardioverter/defibrillator) present 03/29/2022   Allergic rhinitis    Asthma    Cardiomyopathy (HCC)    CHF (congestive heart failure) (HCC)    CHF (congestive heart failure), NYHA class II (HCC)    Chronic systolic (congestive) heart failure (HCC)    Dilated cardiomyopathy (HCC)    Grade II diastolic dysfunction    High cholesterol    History of angioedema    Hypertension    LBBB (left bundle branch block)    Low left ventricular ejection fraction    Moderate aortic regurgitation    Moderate mitral regurgitation by prior echocardiogram    Non-ischemic cardiomyopathy (HCC)    Type 2 diabetes mellitus (HCC)    "borderline"   Type 2 diabetes mellitus with peripheral angiopathy (HCC)    Vertigo    Wears dentures    partial lower   Wears hearing aid in both ears     Past Surgical History:  Procedure Laterality Date   APPENDECTOMY     CARDIAC DEFIBRILLATOR PLACEMENT  03/29/2022   CATARACT EXTRACTION W/PHACO Right 03/27/2023   Procedure: CATARACT EXTRACTION PHACO AND INTRAOCULAR LENS PLACEMENT (IOC) RIGHT DIABETIC 6.34 00:39.4;  Surgeon: Nevada Crane, MD;  Location: [facility];  Service: Ophthalmology;  Laterality: Right;   CHOLECYSTECTOMY     LUMBAR LAMINECTOMY     L4-5   RIGHT/LEFT HEART CATH AND CORONARY ANGIOGRAPHY N/A 08/30/2021   Procedure: RIGHT/LEFT HEART CATH AND CORONARY ANGIOGRAPHY;  Surgeon: Yvonne Kendall, MD;  Location: [facility];  Service: Cardiovascular;  Laterality: N/A;   TONSILLECTOMY      Prior to Admission medications   Medication Sig Start Date End Date Taking? Authorizing Provider  albuterol (VENTOLIN HFA) 108 (90 Base) MCG/ACT inhaler Inhale into the lungs  every 6 (six) hours as needed for wheezing or shortness of breath.   Yes [provider]  aspirin EC 81 MG tablet Take 1 tablet (81 mg total) by mouth daily. Swallow whole. 09/01/21  Yes Enedina Finner, MD  Calcium Carbonate-Vit D-Min (CALTRATE 600+D PLUS MINERALS) 600-800 MG-UNIT TABS Take 1 tablet by mouth daily.   Yes [provider]  dapagliflozin propanediol (FARXIGA) 10 MG TABS tablet Take 1 tablet (10 mg total) by mouth daily. 09/01/21  Yes Enedina Finner, MD  famotidine (PEPCID) 40 MG tablet Take 40 mg by mouth daily.   Yes [provider]  furosemide (LASIX) 40 MG tablet Take 0.5 tablets (20 mg total) by mouth daily. And extra 20mg  PRN for weight gain, swelling or SOB 11/24/21  Yes Hackney, Inetta Fermo A, FNP  loratadine (CLARITIN) 10 MG tablet Take 10 mg by mouth daily.   Yes [provider]  meclizine (ANTIVERT) 12.5 MG tablet Take 1 tablet (12.5 mg total) by mouth 3 (three) times daily as needed for dizziness. 10/17/22  Yes Loleta Rose, MD  Melatonin 5 MG CAPS Take 5 mg by mouth at bedtime.   Yes [provider]  metoprolol succinate (TOPROL-XL) 25 MG 24 hr tablet Take 1 tablet (25 mg total) by mouth daily. 10/18/21  Yes Hackney, Jarold Song, FNP  Multiple Vitamins-Minerals (WOMENS MULTI GUMMIES PO) Take 1 each by mouth daily.   Yes [provider]  pantoprazole (PROTONIX) 40 MG  tablet Take 40 mg by mouth daily.   Yes [provider]  potassium chloride (KLOR-CON) 10 MEQ tablet Take 10 mEq by mouth daily.   Yes [provider]  rosuvastatin (CRESTOR) 20 MG tablet Take 20 mg by mouth daily. 09/30/21  Yes [provider]  spironolactone (ALDACTONE) 25 MG tablet Take 0.5 tablets (12.5 mg total) by mouth daily. 09/01/21  Yes Enedina Finner, MD  TURMERIC PO Take by mouth daily.   Yes [provider]  nitroGLYCERIN (NITROSTAT) 0.4 MG SL tablet Place 0.4 mg under the tongue every 5 (five) minutes as needed for chest pain.     [provider]    Allergies as of 02/17/2023 - Reviewed 10/17/2022  Allergen Reaction Noted   Ace inhibitors Other (See Comments) 08/30/2021    Family History  Problem Relation Age of Onset   Stroke Mother        died @ 83   Dementia Mother    Stroke Father        died @ 43   Stroke Sister    Heart attack Brother        died @ 78   Breast cancer Neg Hx     Social History   Socioeconomic History   Marital status: Single    Spouse name: Not on file   Number of children: Not on file   Years of education: Not on file   Highest education level: Not on file  Occupational History   Not on file  Tobacco Use   Smoking status: Never   Smokeless tobacco: Never  Vaping Use   Vaping status: Never Used  Substance and Sexual Activity   Alcohol use: Not Currently   Drug use: Never   Sexual activity: Not Currently  Other Topics Concern   Not on file  Social History Narrative   Lives locally.  Does not routinely exercise.   Social Drivers of Health   Financial Resource Strain: Medium Risk (11/28/2022)   Received from [facility]   Overall Financial Resource Strain (CARDIA)    Difficulty of Paying Living Expenses: Somewhat hard  Food Insecurity: Food Insecurity Present (11/28/2022)   Received [facility]   Hunger Vital Sign    Worried About Running Out of Food in the Last Year: Sometimes true    Ran Out of Food in the Last Year: Sometimes true  Transportation Needs: No Transportation Needs (11/28/2022)   Received from [facility]    In the past 12 months, has lack of transportation kept you from medical appointments or from getting medications?: No    Lack of Transportation (Non-Medical): No  Physical Activity: Not on file  Stress: Not on file  Social Connections: Not on file  Intimate Partner Violence: Not on file    Review of Systems: See HPI, otherwise negative  ROS  Physical Exam: BP (!) 145/63   Pulse 80   Temp (!) 97.3 F (36.3 C) (Temporal)   Resp 16   Ht 5' 2.01" (1.575 m)   Wt 59 kg   SpO2 100%   BMI 23.77 kg/m  General:   Alert, cooperative in NAD Head:  Normocephalic and atraumatic. Respiratory:  Normal work of breathing. Cardiovascular:  RRR  Impression/Plan: Glenda May is here for cataract surgery.  Risks, benefits, limitations, and alternatives regarding cataract surgery have been reviewed with the patient.  Questions have been answered.  All parties agreeable.  Willey Blade, MD  04/10/2023, 10:42 AM

## 2023-04-10 NOTE — Anesthesia Postprocedure Evaluation (Signed)
 Anesthesia Post Note  Patient: Glenda May  Procedure(s) Performed: CATARACT EXTRACTION PHACO AND INTRAOCULAR LENS PLACEMENT (IOC) LEFT 5.19 00:36.0 (Left)  Patient location during evaluation: PACU Anesthesia Type: MAC Level of consciousness: awake and alert Pain management: pain level controlled Vital Signs Assessment: post-procedure vital signs reviewed and stable Respiratory status: spontaneous breathing, nonlabored ventilation, respiratory function stable and patient connected to nasal cannula oxygen Cardiovascular status: stable and blood pressure returned to baseline Postop Assessment: no apparent nausea or vomiting Anesthetic complications: no   No notable events documented.   Last Vitals:  Vitals:   04/10/23 1115 04/10/23 1118  BP: 135/68 139/72  Pulse: 84 80  Resp: (!) 22 (!) 22  Temp:  (!) 36.1 C  SpO2: 99% 100%    Last Pain:  Vitals:   04/10/23 1118  TempSrc:   PainSc: 0-No pain                 Abbagail Scaff C Boleslaw Borghi

## 2023-04-10 NOTE — Op Note (Signed)
 OPERATIVE NOTE  Glenda May [id number] 04/10/2023   PREOPERATIVE DIAGNOSIS:  Nuclear sclerotic cataract left eye.  H25.12   POSTOPERATIVE DIAGNOSIS:    Nuclear sclerotic cataract left eye.     PROCEDURE:  Phacoemusification with posterior chamber intraocular lens placement of the left eye   LENS:   Implant Name Type Inv. Item Serial No. Manufacturer Lot No. LRB No. Used Action  LENS IOL TECNIS EYHANCE 23.5 - [id number] Intraocular Lens LENS IOL TECNIS EYHANCE 23.5 [id number] [employer]  Left 1 Implanted      Procedure(s): CATARACT EXTRACTION PHACO AND INTRAOCULAR LENS PLACEMENT (IOC) LEFT 5.19 00:36.0 (Left)  SURGEON:  Willey Blade, MD, MPH   ANESTHESIA:  Topical with tetracaine drops augmented with 1% preservative-free intracameral lidocaine.  ESTIMATED BLOOD LOSS: <1 mL   COMPLICATIONS:  None.   DESCRIPTION OF PROCEDURE:  The patient was identified in the holding room and transported to the operating room and placed in the supine position under the operating microscope.  The left eye was identified as the operative eye and it was prepped and draped in the usual sterile ophthalmic fashion.   A 1.0 millimeter clear-corneal paracentesis was made at the 5:00 position. 0.5 ml of preservative-free 1% lidocaine with epinephrine was injected into the anterior chamber.  The anterior chamber was filled with viscoelastic.  A 2.4 millimeter keratome was used to make a near-clear corneal incision at the 2:00 position.  A curvilinear capsulorrhexis was made with a cystotome and capsulorrhexis forceps.  Balanced salt solution was used to hydrodissect and hydrodelineate the nucleus.   Phacoemulsification was then used in stop and chop fashion to remove the lens nucleus and epinucleus.  The remaining cortex was then removed using the irrigation and aspiration handpiece. Viscoelastic was then placed into the capsular bag to distend it for lens placement.  A lens was then injected into the  capsular bag.  The remaining viscoelastic was aspirated.   Wounds were hydrated with balanced salt solution.  The anterior chamber was inflated to a physiologic pressure with balanced salt solution.  Intracameral vigamox 0.1 mL undiltued was injected into the eye and a drop placed onto the ocular surface.  No wound leaks were noted.  The patient was taken to the recovery room in stable condition without complications of anesthesia or surgery  Willey Blade 04/10/2023, 11:11 AM

## 2023-04-10 NOTE — Transfer of Care (Signed)
 Immediate Anesthesia Transfer of Care Note  Patient: Glenda May  Procedure(s) Performed: CATARACT EXTRACTION PHACO AND INTRAOCULAR LENS PLACEMENT (IOC) LEFT 5.19 00:36.0 (Left)  Patient Location: PACU  Anesthesia Type:MAC  Level of Consciousness: awake, alert , and oriented  Airway & Oxygen Therapy: Patient Spontanous Breathing  Post-op Assessment: Report given to RN and Post -op Vital signs reviewed and stable  Post vital signs: Reviewed and stable  Last Vitals:  Vitals Value Taken Time  BP 135/68 04/10/23 1113  Temp 36.1 C 04/10/23 1113  Pulse 84 04/10/23 1115  Resp 22 04/10/23 1115  SpO2 99 % 04/10/23 1115  Vitals shown include unfiled device data.  Last Pain:  Vitals:   04/10/23 1113  TempSrc:   PainSc: 0-No pain      Patients Stated Pain Goal: 0 (04/10/23 0956)  Complications: No notable events documented.

## 2023-06-09 ENCOUNTER — Other Ambulatory Visit: Payer: Self-pay | Admitting: Internal Medicine

## 2023-06-09 DIAGNOSIS — Z0000 Encounter for general adult medical examination without abnormal findings: Secondary | ICD-10-CM

## 2023-06-09 DIAGNOSIS — R918 Other nonspecific abnormal finding of lung field: Secondary | ICD-10-CM

## 2023-06-15 ENCOUNTER — Ambulatory Visit
Admission: RE | Admit: 2023-06-15 | Discharge: 2023-06-15 | Disposition: A | Discharge status: Home / Self Care | Source: Ambulatory Visit | Attending: Internal Medicine | Admitting: Internal Medicine

## 2023-06-15 DIAGNOSIS — R918 Other nonspecific abnormal finding of lung field: Secondary | ICD-10-CM | POA: Insufficient documentation

## 2023-06-15 DIAGNOSIS — Z0000 Encounter for general adult medical examination without abnormal findings: Secondary | ICD-10-CM | POA: Diagnosis present

## 2023-12-21 ENCOUNTER — Other Ambulatory Visit: Payer: Self-pay | Admitting: Neurology

## 2023-12-21 DIAGNOSIS — R42 Dizziness and giddiness: Secondary | ICD-10-CM

## 2023-12-21 DIAGNOSIS — R519 Headache, unspecified: Secondary | ICD-10-CM

## 2024-01-19 ENCOUNTER — Other Ambulatory Visit: Payer: Self-pay | Admitting: Internal Medicine

## 2024-01-19 DIAGNOSIS — Z1231 Encounter for screening mammogram for malignant neoplasm of breast: Secondary | ICD-10-CM

## 2024-02-14 ENCOUNTER — Ambulatory Visit
Admission: RE | Admit: 2024-02-14 | Discharge: 2024-02-14 | Disposition: A | Source: Ambulatory Visit | Attending: Internal Medicine | Admitting: Internal Medicine

## 2024-02-14 DIAGNOSIS — Z1231 Encounter for screening mammogram for malignant neoplasm of breast: Secondary | ICD-10-CM
